# Patient Record
Sex: Male | Born: 1950 | Race: White | Hispanic: No | Marital: Married | Smoking: Former smoker
Health system: Southern US, Community
[De-identification: ages and names within clinical notes are randomized; demographics above are authoritative.]

## PROBLEM LIST (undated history)

## (undated) DIAGNOSIS — G4730 Sleep apnea, unspecified: Secondary | ICD-10-CM

## (undated) DIAGNOSIS — M1990 Unspecified osteoarthritis, unspecified site: Secondary | ICD-10-CM

## (undated) DIAGNOSIS — E291 Testicular hypofunction: Secondary | ICD-10-CM

## (undated) DIAGNOSIS — E785 Hyperlipidemia, unspecified: Secondary | ICD-10-CM

## (undated) DIAGNOSIS — E108 Type 1 diabetes mellitus with unspecified complications: Secondary | ICD-10-CM

## (undated) DIAGNOSIS — K91840 Postprocedural hemorrhage and hematoma of a digestive system organ or structure following a digestive system procedure: Secondary | ICD-10-CM

## (undated) DIAGNOSIS — Z5189 Encounter for other specified aftercare: Secondary | ICD-10-CM

## (undated) DIAGNOSIS — I209 Angina pectoris, unspecified: Secondary | ICD-10-CM

## (undated) DIAGNOSIS — T7840XA Allergy, unspecified, initial encounter: Secondary | ICD-10-CM

## (undated) DIAGNOSIS — I2510 Atherosclerotic heart disease of native coronary artery without angina pectoris: Principal | ICD-10-CM

## (undated) DIAGNOSIS — G4733 Obstructive sleep apnea (adult) (pediatric): Secondary | ICD-10-CM

## (undated) DIAGNOSIS — K635 Polyp of colon: Secondary | ICD-10-CM

## (undated) DIAGNOSIS — I10 Essential (primary) hypertension: Secondary | ICD-10-CM

## (undated) DIAGNOSIS — M109 Gout, unspecified: Secondary | ICD-10-CM

## (undated) DIAGNOSIS — N259 Disorder resulting from impaired renal tubular function, unspecified: Secondary | ICD-10-CM

## (undated) DIAGNOSIS — E11319 Type 2 diabetes mellitus with unspecified diabetic retinopathy without macular edema: Secondary | ICD-10-CM

## (undated) DIAGNOSIS — R0602 Shortness of breath: Secondary | ICD-10-CM

## (undated) HISTORY — DX: Unspecified osteoarthritis, unspecified site: M19.90

## (undated) HISTORY — PX: CARPAL TUNNEL RELEASE: SHX101

## (undated) HISTORY — DX: Encounter for other specified aftercare: Z51.89

## (undated) HISTORY — DX: Atherosclerotic heart disease of native coronary artery without angina pectoris: I25.10

## (undated) HISTORY — DX: Type 1 diabetes mellitus with unspecified complications: E10.8

## (undated) HISTORY — DX: Hyperlipidemia, unspecified: E78.5

## (undated) HISTORY — DX: Sleep apnea, unspecified: G47.30

## (undated) HISTORY — PX: EYE EXAMINATION UNDER ANESTHESIA W/ RETINAL CRYOTHERAPY AND RETINAL LASER: SHX1561

## (undated) HISTORY — PX: COLONOSCOPY: SHX174

## (undated) HISTORY — DX: Allergy, unspecified, initial encounter: T78.40XA

## (undated) HISTORY — DX: Gout, unspecified: M10.9

## (undated) HISTORY — DX: Postprocedural hemorrhage of a digestive system organ or structure following a digestive system procedure: K91.840

## (undated) HISTORY — DX: Polyp of colon: K63.5

## (undated) HISTORY — DX: Essential (primary) hypertension: I10

## (undated) HISTORY — PX: POLYPECTOMY: SHX149

## (undated) HISTORY — DX: Testicular hypofunction: E29.1

## (undated) HISTORY — DX: Type 2 diabetes mellitus with unspecified diabetic retinopathy without macular edema: E11.319

## (undated) HISTORY — DX: Disorder resulting from impaired renal tubular function, unspecified: N25.9

---

## 2001-11-28 ENCOUNTER — Encounter: Payer: Self-pay | Admitting: Family Medicine

## 2001-11-28 LAB — CONVERTED CEMR LAB

## 2002-01-03 ENCOUNTER — Encounter: Admission: RE | Admit: 2002-01-03 | Discharge: 2002-01-03 | Payer: Self-pay | Admitting: Family Medicine

## 2002-01-03 ENCOUNTER — Encounter: Payer: Self-pay | Admitting: Family Medicine

## 2002-02-18 ENCOUNTER — Encounter: Admission: RE | Admit: 2002-02-18 | Discharge: 2002-05-19 | Payer: Self-pay | Admitting: Family Medicine

## 2005-01-24 ENCOUNTER — Ambulatory Visit: Payer: Self-pay | Admitting: Family Medicine

## 2005-01-27 ENCOUNTER — Ambulatory Visit: Payer: Self-pay | Admitting: Family Medicine

## 2005-02-07 ENCOUNTER — Ambulatory Visit: Payer: Self-pay | Admitting: Family Medicine

## 2005-02-28 ENCOUNTER — Encounter: Admission: RE | Admit: 2005-02-28 | Discharge: 2005-02-28 | Payer: Self-pay | Admitting: Nephrology

## 2005-03-31 ENCOUNTER — Encounter: Admission: RE | Admit: 2005-03-31 | Discharge: 2005-06-29 | Payer: Self-pay | Admitting: Nephrology

## 2005-04-08 ENCOUNTER — Ambulatory Visit: Payer: Self-pay | Admitting: Family Medicine

## 2006-03-29 ENCOUNTER — Ambulatory Visit: Payer: Self-pay | Admitting: Family Medicine

## 2006-04-11 ENCOUNTER — Ambulatory Visit: Payer: Self-pay | Admitting: Family Medicine

## 2006-04-19 ENCOUNTER — Ambulatory Visit: Payer: Self-pay | Admitting: Family Medicine

## 2006-07-12 ENCOUNTER — Ambulatory Visit: Payer: Self-pay | Admitting: Family Medicine

## 2006-07-17 ENCOUNTER — Ambulatory Visit: Payer: Self-pay | Admitting: Family Medicine

## 2006-08-21 ENCOUNTER — Encounter: Payer: Self-pay | Admitting: Family Medicine

## 2006-08-23 ENCOUNTER — Ambulatory Visit: Payer: Self-pay | Admitting: Family Medicine

## 2006-11-06 ENCOUNTER — Ambulatory Visit: Payer: Self-pay | Admitting: Family Medicine

## 2006-11-06 LAB — CONVERTED CEMR LAB
Creatinine,U: 200.1 mg/dL
Hgb A1c MFr Bld: 8.2 % — ABNORMAL HIGH (ref 4.6–6.0)
Microalb Creat Ratio: 283.4 mg/g — ABNORMAL HIGH (ref 0.0–30.0)
Microalb, Ur: 56.7 mg/dL (ref 0.0–1.9)

## 2006-12-29 ENCOUNTER — Ambulatory Visit: Payer: Self-pay | Admitting: Family Medicine

## 2006-12-29 LAB — CONVERTED CEMR LAB: Uric Acid, Serum: 9.3 mg/dL — ABNORMAL HIGH (ref 2.4–7.0)

## 2007-02-12 ENCOUNTER — Ambulatory Visit: Payer: Self-pay | Admitting: Family Medicine

## 2007-02-12 LAB — CONVERTED CEMR LAB
ALT: 45 units/L — ABNORMAL HIGH (ref 0–40)
AST: 33 units/L (ref 0–37)
Albumin: 3.9 g/dL (ref 3.5–5.2)
Alkaline Phosphatase: 64 units/L (ref 39–117)
BUN: 15 mg/dL (ref 6–23)
Basophils Absolute: 0 10*3/uL (ref 0.0–0.1)
Basophils Relative: 0.4 % (ref 0.0–1.0)
Bilirubin, Direct: 0.2 mg/dL (ref 0.0–0.3)
CO2: 31 meq/L (ref 19–32)
Calcium: 9.3 mg/dL (ref 8.4–10.5)
Chloride: 100 meq/L (ref 96–112)
Creatinine, Ser: 1.3 mg/dL (ref 0.4–1.5)
Creatinine,U: 247 mg/dL
Eosinophils Absolute: 0.1 10*3/uL (ref 0.0–0.6)
Eosinophils Relative: 1.1 % (ref 0.0–5.0)
GFR calc Af Amer: 73 mL/min
GFR calc non Af Amer: 60 mL/min
Glucose, Bld: 143 mg/dL — ABNORMAL HIGH (ref 70–99)
HCT: 38.2 % — ABNORMAL LOW (ref 39.0–52.0)
Hemoglobin: 13.2 g/dL (ref 13.0–17.0)
Hgb A1c MFr Bld: 8 % — ABNORMAL HIGH (ref 4.6–6.0)
Lymphocytes Relative: 20.3 % (ref 12.0–46.0)
MCHC: 34.5 g/dL (ref 30.0–36.0)
MCV: 90 fL (ref 78.0–100.0)
Microalb Creat Ratio: 738.1 mg/g — ABNORMAL HIGH (ref 0.0–30.0)
Microalb, Ur: 182.3 mg/dL (ref 0.0–1.9)
Monocytes Absolute: 0.5 10*3/uL (ref 0.2–0.7)
Monocytes Relative: 8.7 % (ref 3.0–11.0)
Neutro Abs: 4.2 10*3/uL (ref 1.4–7.7)
Neutrophils Relative %: 69.5 % (ref 43.0–77.0)
Platelets: 184 10*3/uL (ref 150–400)
Potassium: 3.7 meq/L (ref 3.5–5.1)
RBC: 4.24 M/uL (ref 4.22–5.81)
RDW: 13.5 % (ref 11.5–14.6)
Sodium: 140 meq/L (ref 135–145)
Total Bilirubin: 0.7 mg/dL (ref 0.3–1.2)
Total Protein: 6.8 g/dL (ref 6.0–8.3)
Uric Acid, Serum: 6 mg/dL (ref 2.4–7.0)
WBC: 6 10*3/uL (ref 4.5–10.5)

## 2007-02-26 ENCOUNTER — Ambulatory Visit: Payer: Self-pay | Admitting: Endocrinology

## 2007-03-20 ENCOUNTER — Ambulatory Visit: Payer: Self-pay | Admitting: Endocrinology

## 2007-04-19 ENCOUNTER — Ambulatory Visit: Payer: Self-pay | Admitting: Endocrinology

## 2007-06-11 ENCOUNTER — Ambulatory Visit: Payer: Self-pay | Admitting: Endocrinology

## 2007-06-11 LAB — CONVERTED CEMR LAB: Hgb A1c MFr Bld: 8.9 % — ABNORMAL HIGH (ref 4.6–6.0)

## 2007-06-19 ENCOUNTER — Ambulatory Visit: Payer: Self-pay | Admitting: Endocrinology

## 2007-07-06 ENCOUNTER — Encounter: Payer: Self-pay | Admitting: Family Medicine

## 2007-07-06 DIAGNOSIS — E1169 Type 2 diabetes mellitus with other specified complication: Secondary | ICD-10-CM | POA: Insufficient documentation

## 2007-07-06 DIAGNOSIS — I10 Essential (primary) hypertension: Secondary | ICD-10-CM

## 2007-07-06 DIAGNOSIS — E785 Hyperlipidemia, unspecified: Secondary | ICD-10-CM

## 2007-07-06 DIAGNOSIS — E1159 Type 2 diabetes mellitus with other circulatory complications: Secondary | ICD-10-CM

## 2007-07-06 HISTORY — DX: Essential (primary) hypertension: I10

## 2007-07-11 ENCOUNTER — Encounter: Payer: Self-pay | Admitting: Family Medicine

## 2007-07-11 DIAGNOSIS — M109 Gout, unspecified: Secondary | ICD-10-CM

## 2007-07-11 HISTORY — DX: Gout, unspecified: M10.9

## 2007-07-12 ENCOUNTER — Ambulatory Visit: Payer: Self-pay | Admitting: Endocrinology

## 2007-07-17 ENCOUNTER — Encounter: Payer: Self-pay | Admitting: Family Medicine

## 2007-07-31 ENCOUNTER — Ambulatory Visit: Payer: Self-pay | Admitting: Family Medicine

## 2007-07-31 LAB — CONVERTED CEMR LAB
Basophils Relative: 0 % (ref 0.0–1.0)
Bilirubin Urine: NEGATIVE
Bilirubin, Direct: 0.1 mg/dL (ref 0.0–0.3)
Blood in Urine, dipstick: NEGATIVE
CO2: 32 meq/L (ref 19–32)
Cholesterol: 115 mg/dL (ref 0–200)
Creatinine, Ser: 1.9 mg/dL — ABNORMAL HIGH (ref 0.4–1.5)
Eosinophils Relative: 0.7 % (ref 0.0–5.0)
GFR calc Af Amer: 47 mL/min
Glucose, Bld: 160 mg/dL — ABNORMAL HIGH (ref 70–99)
Glucose, Urine, Semiquant: NEGATIVE
HCT: 37.8 % — ABNORMAL LOW (ref 39.0–52.0)
Hemoglobin: 13.2 g/dL (ref 13.0–17.0)
Ketones, urine, test strip: NEGATIVE
Lymphocytes Relative: 14.2 % (ref 12.0–46.0)
Microalb Creat Ratio: 115.1 mg/g — ABNORMAL HIGH (ref 0.0–30.0)
Microalb, Ur: 22 mg/dL — ABNORMAL HIGH (ref 0.0–1.9)
Monocytes Absolute: 0.5 10*3/uL (ref 0.2–0.7)
Monocytes Relative: 5.5 % (ref 3.0–11.0)
Neutro Abs: 6.7 10*3/uL (ref 1.4–7.7)
Neutrophils Relative %: 79.6 % — ABNORMAL HIGH (ref 43.0–77.0)
Nitrite: NEGATIVE
Potassium: 4.2 meq/L (ref 3.5–5.1)
Sodium: 146 meq/L — ABNORMAL HIGH (ref 135–145)
Specific Gravity, Urine: 1.02
Total Bilirubin: 0.7 mg/dL (ref 0.3–1.2)
Total Protein: 7.4 g/dL (ref 6.0–8.3)
Urobilinogen, UA: 0.2
VLDL: 61 mg/dL — ABNORMAL HIGH (ref 0–40)
WBC Urine, dipstick: NEGATIVE
WBC: 8.5 10*3/uL (ref 4.5–10.5)
pH: 5

## 2007-08-09 ENCOUNTER — Ambulatory Visit: Payer: Self-pay | Admitting: Family Medicine

## 2007-08-09 DIAGNOSIS — E108 Type 1 diabetes mellitus with unspecified complications: Secondary | ICD-10-CM

## 2007-08-09 DIAGNOSIS — N529 Male erectile dysfunction, unspecified: Secondary | ICD-10-CM | POA: Insufficient documentation

## 2007-08-09 HISTORY — DX: Type 1 diabetes mellitus with unspecified complications: E10.8

## 2007-09-06 ENCOUNTER — Encounter: Payer: Self-pay | Admitting: Endocrinology

## 2007-09-06 ENCOUNTER — Ambulatory Visit: Payer: Self-pay | Admitting: Endocrinology

## 2007-09-06 LAB — CONVERTED CEMR LAB: Hgb A1c MFr Bld: 6.8 % — ABNORMAL HIGH (ref 4.6–6.0)

## 2007-10-22 ENCOUNTER — Telehealth: Payer: Self-pay | Admitting: Family Medicine

## 2007-12-17 ENCOUNTER — Telehealth: Payer: Self-pay | Admitting: Endocrinology

## 2008-02-19 ENCOUNTER — Telehealth (INDEPENDENT_AMBULATORY_CARE_PROVIDER_SITE_OTHER): Payer: Self-pay | Admitting: *Deleted

## 2008-03-04 ENCOUNTER — Ambulatory Visit: Payer: Self-pay | Admitting: Endocrinology

## 2008-03-04 LAB — CONVERTED CEMR LAB
Creatinine,U: 202.8 mg/dL
Hgb A1c MFr Bld: 7 % — ABNORMAL HIGH (ref 4.6–6.0)
Microalb, Ur: 66.5 mg/dL — ABNORMAL HIGH (ref 0.0–1.9)

## 2008-07-02 ENCOUNTER — Ambulatory Visit: Payer: Self-pay | Admitting: Family Medicine

## 2008-07-03 ENCOUNTER — Telehealth (INDEPENDENT_AMBULATORY_CARE_PROVIDER_SITE_OTHER): Payer: Self-pay | Admitting: *Deleted

## 2008-07-03 ENCOUNTER — Ambulatory Visit: Payer: Self-pay | Admitting: Endocrinology

## 2008-07-03 LAB — CONVERTED CEMR LAB
Eosinophils Absolute: 0.1 10*3/uL (ref 0.0–0.7)
Eosinophils Relative: 1.2 % (ref 0.0–5.0)
HCT: 41.6 % (ref 39.0–52.0)
Hgb A1c MFr Bld: 6.2 % — ABNORMAL HIGH (ref 4.6–6.0)
MCV: 96.3 fL (ref 78.0–100.0)
Mono Screen: NEGATIVE
Monocytes Absolute: 0.6 10*3/uL (ref 0.1–1.0)
Monocytes Relative: 6.9 % (ref 3.0–12.0)
Neutrophils Relative %: 73.5 % (ref 43.0–77.0)
Platelets: 159 10*3/uL (ref 150–400)
RDW: 13.8 % (ref 11.5–14.6)
WBC: 8 10*3/uL (ref 4.5–10.5)

## 2008-07-15 ENCOUNTER — Encounter: Payer: Self-pay | Admitting: Endocrinology

## 2008-08-11 ENCOUNTER — Telehealth: Payer: Self-pay | Admitting: Family Medicine

## 2008-08-26 ENCOUNTER — Telehealth: Payer: Self-pay | Admitting: Family Medicine

## 2008-08-26 ENCOUNTER — Ambulatory Visit: Payer: Self-pay | Admitting: Family Medicine

## 2008-08-26 LAB — CONVERTED CEMR LAB
AST: 31 units/L (ref 0–37)
Albumin: 4.3 g/dL (ref 3.5–5.2)
BUN: 18 mg/dL (ref 6–23)
Basophils Absolute: 0.1 10*3/uL (ref 0.0–0.1)
Basophils Relative: 1 % (ref 0.0–3.0)
Blood in Urine, dipstick: NEGATIVE
Calcium: 9.4 mg/dL (ref 8.4–10.5)
Chloride: 102 meq/L (ref 96–112)
Creatinine, Ser: 1.4 mg/dL (ref 0.4–1.5)
Creatinine,U: 158.6 mg/dL
Eosinophils Absolute: 0.1 10*3/uL (ref 0.0–0.7)
Eosinophils Relative: 1.4 % (ref 0.0–5.0)
GFR calc non Af Amer: 55 mL/min
Glucose, Urine, Semiquant: NEGATIVE
HCT: 40.9 % (ref 39.0–52.0)
Hemoglobin: 14.5 g/dL (ref 13.0–17.0)
Hgb A1c MFr Bld: 6.5 % — ABNORMAL HIGH (ref 4.6–6.0)
MCHC: 35.4 g/dL (ref 30.0–36.0)
MCV: 95.9 fL (ref 78.0–100.0)
Microalb, Ur: 25.4 mg/dL — ABNORMAL HIGH (ref 0.0–1.9)
Monocytes Absolute: 0.4 10*3/uL (ref 0.1–1.0)
Neutro Abs: 4.1 10*3/uL (ref 1.4–7.7)
Neutrophils Relative %: 70.3 % (ref 43.0–77.0)
Nitrite: NEGATIVE
RBC: 4.26 M/uL (ref 4.22–5.81)
Specific Gravity, Urine: 1.02
Total Bilirubin: 0.9 mg/dL (ref 0.3–1.2)
WBC Urine, dipstick: NEGATIVE
WBC: 5.9 10*3/uL (ref 4.5–10.5)
pH: 6.5

## 2008-09-01 ENCOUNTER — Encounter: Payer: Self-pay | Admitting: Family Medicine

## 2008-09-02 ENCOUNTER — Ambulatory Visit: Payer: Self-pay | Admitting: Family Medicine

## 2008-09-16 ENCOUNTER — Ambulatory Visit: Payer: Self-pay | Admitting: Internal Medicine

## 2008-10-02 ENCOUNTER — Ambulatory Visit: Payer: Self-pay | Admitting: Gastroenterology

## 2008-10-02 DIAGNOSIS — Z860100 Personal history of colon polyps, unspecified: Secondary | ICD-10-CM | POA: Insufficient documentation

## 2008-10-07 ENCOUNTER — Telehealth: Payer: Self-pay | Admitting: Gastroenterology

## 2008-10-08 ENCOUNTER — Ambulatory Visit: Payer: Self-pay | Admitting: Gastroenterology

## 2008-10-08 ENCOUNTER — Encounter: Payer: Self-pay | Admitting: Gastroenterology

## 2008-10-09 ENCOUNTER — Encounter: Payer: Self-pay | Admitting: Gastroenterology

## 2008-10-31 ENCOUNTER — Telehealth (INDEPENDENT_AMBULATORY_CARE_PROVIDER_SITE_OTHER): Payer: Self-pay | Admitting: *Deleted

## 2008-11-10 ENCOUNTER — Ambulatory Visit: Payer: Self-pay | Admitting: Endocrinology

## 2008-11-10 LAB — CONVERTED CEMR LAB: Hgb A1c MFr Bld: 6.8 % — ABNORMAL HIGH (ref 4.6–6.0)

## 2008-12-04 ENCOUNTER — Ambulatory Visit: Payer: Self-pay | Admitting: Family Medicine

## 2008-12-04 LAB — CONVERTED CEMR LAB
CO2: 31 meq/L (ref 19–32)
Chloride: 100 meq/L (ref 96–112)
Creatinine, Ser: 1.8 mg/dL — ABNORMAL HIGH (ref 0.4–1.5)
Glucose, Bld: 98 mg/dL (ref 70–99)
Sodium: 140 meq/L (ref 135–145)

## 2008-12-10 ENCOUNTER — Ambulatory Visit: Payer: Self-pay | Admitting: Family Medicine

## 2008-12-11 DIAGNOSIS — N183 Chronic kidney disease, stage 3 (moderate): Secondary | ICD-10-CM

## 2008-12-11 DIAGNOSIS — N259 Disorder resulting from impaired renal tubular function, unspecified: Secondary | ICD-10-CM

## 2008-12-11 DIAGNOSIS — E1122 Type 2 diabetes mellitus with diabetic chronic kidney disease: Secondary | ICD-10-CM | POA: Insufficient documentation

## 2008-12-11 HISTORY — DX: Disorder resulting from impaired renal tubular function, unspecified: N25.9

## 2009-02-16 ENCOUNTER — Ambulatory Visit: Payer: Self-pay | Admitting: Endocrinology

## 2009-02-16 LAB — CONVERTED CEMR LAB: Hgb A1c MFr Bld: 6.8 % — ABNORMAL HIGH (ref 4.6–6.5)

## 2009-04-17 ENCOUNTER — Ambulatory Visit: Payer: Self-pay | Admitting: Family Medicine

## 2009-06-11 ENCOUNTER — Ambulatory Visit: Payer: Self-pay | Admitting: Endocrinology

## 2009-06-11 DIAGNOSIS — R809 Proteinuria, unspecified: Secondary | ICD-10-CM | POA: Insufficient documentation

## 2009-06-11 LAB — CONVERTED CEMR LAB
Creatinine,U: 93.1 mg/dL
Hgb A1c MFr Bld: 6.4 % (ref 4.6–6.5)
Microalb Creat Ratio: 151.5 mg/g — ABNORMAL HIGH (ref 0.0–30.0)

## 2009-07-06 ENCOUNTER — Telehealth: Payer: Self-pay | Admitting: Endocrinology

## 2009-07-10 ENCOUNTER — Encounter: Payer: Self-pay | Admitting: Endocrinology

## 2009-09-04 ENCOUNTER — Ambulatory Visit: Payer: Self-pay | Admitting: Family Medicine

## 2009-09-04 LAB — CONVERTED CEMR LAB
ALT: 44 units/L (ref 0–53)
BUN: 21 mg/dL (ref 6–23)
Basophils Absolute: 0 10*3/uL (ref 0.0–0.1)
Chloride: 105 meq/L (ref 96–112)
Cholesterol: 122 mg/dL (ref 0–200)
Eosinophils Absolute: 0.1 10*3/uL (ref 0.0–0.7)
Glucose, Bld: 148 mg/dL — ABNORMAL HIGH (ref 70–99)
HCT: 41.5 % (ref 39.0–52.0)
Ketones, ur: NEGATIVE mg/dL
Leukocytes, UA: NEGATIVE
Lymphs Abs: 1.3 10*3/uL (ref 0.7–4.0)
MCHC: 34.1 g/dL (ref 30.0–36.0)
MCV: 98.5 fL (ref 78.0–100.0)
Monocytes Absolute: 0.5 10*3/uL (ref 0.1–1.0)
PSA: 1.26 ng/mL (ref 0.10–4.00)
Platelets: 127 10*3/uL — ABNORMAL LOW (ref 150.0–400.0)
Potassium: 4.7 meq/L (ref 3.5–5.1)
RDW: 13.2 % (ref 11.5–14.6)
Specific Gravity, Urine: 1.015 (ref 1.000–1.030)
TSH: 2.27 microintl units/mL (ref 0.35–5.50)
Total Bilirubin: 0.9 mg/dL (ref 0.3–1.2)
Urobilinogen, UA: 0.2 (ref 0.0–1.0)

## 2009-09-14 ENCOUNTER — Ambulatory Visit: Payer: Self-pay | Admitting: Family Medicine

## 2009-10-08 ENCOUNTER — Ambulatory Visit: Payer: Self-pay | Admitting: Endocrinology

## 2009-10-08 DIAGNOSIS — G4733 Obstructive sleep apnea (adult) (pediatric): Secondary | ICD-10-CM

## 2009-10-08 HISTORY — DX: Obstructive sleep apnea (adult) (pediatric): G47.33

## 2009-10-08 LAB — CONVERTED CEMR LAB: Hgb A1c MFr Bld: 6.5 % (ref 4.6–6.5)

## 2009-10-16 ENCOUNTER — Encounter: Payer: Self-pay | Admitting: Family Medicine

## 2009-11-05 ENCOUNTER — Ambulatory Visit: Payer: Self-pay | Admitting: Pulmonary Disease

## 2009-11-19 ENCOUNTER — Encounter: Payer: Self-pay | Admitting: Pulmonary Disease

## 2009-11-19 ENCOUNTER — Ambulatory Visit (HOSPITAL_BASED_OUTPATIENT_CLINIC_OR_DEPARTMENT_OTHER): Admission: RE | Admit: 2009-11-19 | Discharge: 2009-11-19 | Payer: Self-pay | Admitting: Pulmonary Disease

## 2009-11-25 ENCOUNTER — Ambulatory Visit: Payer: Self-pay | Admitting: Pulmonary Disease

## 2009-11-26 ENCOUNTER — Encounter: Payer: Self-pay | Admitting: Pulmonary Disease

## 2009-11-30 ENCOUNTER — Telehealth (INDEPENDENT_AMBULATORY_CARE_PROVIDER_SITE_OTHER): Payer: Self-pay | Admitting: *Deleted

## 2009-12-07 ENCOUNTER — Ambulatory Visit: Payer: Self-pay | Admitting: Pulmonary Disease

## 2009-12-15 ENCOUNTER — Encounter: Payer: Self-pay | Admitting: Pulmonary Disease

## 2010-01-06 ENCOUNTER — Ambulatory Visit: Payer: Self-pay | Admitting: Pulmonary Disease

## 2010-02-04 ENCOUNTER — Encounter: Payer: Self-pay | Admitting: Pulmonary Disease

## 2010-02-05 ENCOUNTER — Ambulatory Visit: Payer: Self-pay | Admitting: Endocrinology

## 2010-02-05 LAB — CONVERTED CEMR LAB: Hgb A1c MFr Bld: 6.4 % (ref 4.6–6.5)

## 2010-02-26 ENCOUNTER — Ambulatory Visit: Payer: Self-pay | Admitting: Pulmonary Disease

## 2010-02-26 DIAGNOSIS — F17200 Nicotine dependence, unspecified, uncomplicated: Secondary | ICD-10-CM | POA: Insufficient documentation

## 2010-03-23 ENCOUNTER — Telehealth: Payer: Self-pay | Admitting: Pulmonary Disease

## 2010-03-25 ENCOUNTER — Telehealth (INDEPENDENT_AMBULATORY_CARE_PROVIDER_SITE_OTHER): Payer: Self-pay | Admitting: *Deleted

## 2010-04-05 ENCOUNTER — Telehealth (INDEPENDENT_AMBULATORY_CARE_PROVIDER_SITE_OTHER): Payer: Self-pay | Admitting: *Deleted

## 2010-06-11 ENCOUNTER — Ambulatory Visit: Payer: Self-pay | Admitting: Endocrinology

## 2010-06-11 DIAGNOSIS — E291 Testicular hypofunction: Secondary | ICD-10-CM

## 2010-06-11 HISTORY — DX: Testicular hypofunction: E29.1

## 2010-06-11 LAB — CONVERTED CEMR LAB: Hgb A1c MFr Bld: 6.4 % (ref 4.6–6.5)

## 2010-06-15 ENCOUNTER — Ambulatory Visit: Payer: Self-pay | Admitting: Endocrinology

## 2010-06-15 LAB — CONVERTED CEMR LAB
FSH: 13.4 milliintl units/mL (ref 1.4–18.1)
LH: 5.54 milliintl units/mL (ref 1.50–9.30)

## 2010-06-17 ENCOUNTER — Telehealth: Payer: Self-pay | Admitting: Endocrinology

## 2010-08-16 ENCOUNTER — Encounter: Payer: Self-pay | Admitting: Endocrinology

## 2010-08-20 LAB — HM DIABETES EYE EXAM

## 2010-08-25 ENCOUNTER — Ambulatory Visit: Payer: Self-pay | Admitting: Pulmonary Disease

## 2010-09-10 ENCOUNTER — Ambulatory Visit: Payer: Self-pay | Admitting: Family Medicine

## 2010-09-10 LAB — CONVERTED CEMR LAB
ALT: 32 units/L (ref 0–53)
AST: 30 units/L (ref 0–37)
Albumin: 4.3 g/dL (ref 3.5–5.2)
Alkaline Phosphatase: 70 units/L (ref 39–117)
BUN: 23 mg/dL (ref 6–23)
Basophils Absolute: 0 10*3/uL (ref 0.0–0.1)
Basophils Relative: 0.3 % (ref 0.0–3.0)
Bilirubin Urine: NEGATIVE
Bilirubin, Direct: 0.1 mg/dL (ref 0.0–0.3)
Blood in Urine, dipstick: NEGATIVE
CO2: 29 meq/L (ref 19–32)
Calcium: 9.2 mg/dL (ref 8.4–10.5)
Chloride: 102 meq/L (ref 96–112)
Cholesterol: 92 mg/dL (ref 0–200)
Creatinine, Ser: 1.5 mg/dL (ref 0.4–1.5)
Creatinine,U: 50.8 mg/dL
Eosinophils Absolute: 0.1 10*3/uL (ref 0.0–0.7)
Eosinophils Relative: 0.8 % (ref 0.0–5.0)
GFR calc non Af Amer: 51.83 mL/min (ref >60)
Glucose, Bld: 108 mg/dL — ABNORMAL HIGH (ref 70–99)
Glucose, Urine, Semiquant: NEGATIVE
HCT: 41.7 % (ref 39.0–52.0)
HDL: 23.8 mg/dL — ABNORMAL LOW (ref >39.00)
Hemoglobin: 14.3 g/dL (ref 13.0–17.0)
Hgb A1c MFr Bld: 6.4 % (ref 4.6–6.5)
Ketones, urine, test strip: NEGATIVE
LDL Cholesterol: 34 mg/dL (ref 0–99)
Lymphocytes Relative: 18.8 % (ref 12.0–46.0)
Lymphs Abs: 1.5 10*3/uL (ref 0.7–4.0)
MCHC: 34.3 g/dL (ref 30.0–36.0)
MCV: 98.9 fL (ref 78.0–100.0)
Microalb Creat Ratio: 15.4 mg/g (ref 0.0–30.0)
Microalb, Ur: 7.8 mg/dL — ABNORMAL HIGH (ref 0.0–1.9)
Monocytes Absolute: 0.6 10*3/uL (ref 0.1–1.0)
Monocytes Relative: 8 % (ref 3.0–12.0)
Neutro Abs: 5.7 10*3/uL (ref 1.4–7.7)
Neutrophils Relative %: 72.1 % (ref 43.0–77.0)
Nitrite: NEGATIVE
PSA: 1.33 ng/mL (ref 0.10–4.00)
Platelets: 137 10*3/uL — ABNORMAL LOW (ref 150.0–400.0)
Potassium: 4.4 meq/L (ref 3.5–5.1)
Protein, U semiquant: NEGATIVE
RBC: 4.22 M/uL (ref 4.22–5.81)
RDW: 14.2 % (ref 11.5–14.6)
Sodium: 141 meq/L (ref 135–145)
Specific Gravity, Urine: 1.01
TSH: 1.94 microintl units/mL (ref 0.35–5.50)
Total Bilirubin: 0.7 mg/dL (ref 0.3–1.2)
Total CHOL/HDL Ratio: 4
Total Protein: 7 g/dL (ref 6.0–8.3)
Triglycerides: 171 mg/dL — ABNORMAL HIGH (ref 0.0–149.0)
Urobilinogen, UA: 0.2
VLDL: 34.2 mg/dL (ref 0.0–40.0)
WBC Urine, dipstick: NEGATIVE
WBC: 7.9 10*3/uL (ref 4.5–10.5)
pH: 5

## 2010-09-17 ENCOUNTER — Encounter: Payer: Self-pay | Admitting: Family Medicine

## 2010-09-17 ENCOUNTER — Ambulatory Visit: Payer: Self-pay | Admitting: Family Medicine

## 2010-10-11 ENCOUNTER — Telehealth: Payer: Self-pay | Admitting: Family Medicine

## 2010-10-15 ENCOUNTER — Ambulatory Visit: Payer: Self-pay | Admitting: Endocrinology

## 2010-10-18 ENCOUNTER — Telehealth: Payer: Self-pay | Admitting: Endocrinology

## 2010-10-19 ENCOUNTER — Ambulatory Visit: Payer: Self-pay | Admitting: Family Medicine

## 2010-12-28 NOTE — Assessment & Plan Note (Signed)
Summary: 1 month rov/njr   Vital Signs:  Patient profile:   60 year old male Weight:      260 pounds Temp:     98.6 degrees F oral BP sitting:   140 / 80  (left arm) Cuff size:   regular  Vitals Entered By: Kern Reap CMA Duncan Dull) (October 19, 2010 8:56 AM) CC: follow-up visit   Primary Care Provider:  Tinnie Gens Todd,MD  CC:  follow-up visit.  History of Present Illness: Bryan Haley is a 60 year old male.............Marland Kitchen ex smoker x 3 days...........on chantix, one tablet daily.  The comes in today for follow-up.  He states overall he feels well and smoke.  His last cigarette 3 days ago.  No major side effects from the chantix is currently taking one tablet daily.  His blood sugar is under much better control and on the hormonal supplement.  His testosterone level is 345.  Review of systems otherwise negative except that he is going to begin the losartan 100 mg at bedtime.  When he finishes his Micardis.  He also is under the care of Dr. Algis Downs.  the nephrologist  Allergies: No Known Drug Allergies  Past History:  Past medical, surgical, family and social histories (including risk factors) reviewed for relevance to current acute and chronic problems.  Past Medical History: Reviewed history from 12/10/2008 and no changes required. Hyperlipidemia Hypertension Gout DM Nephropathy Diabetes mellitus, type I erectile dysfunction, nonresponsive to oral medication diabetic retinopathy OU, laser surgery, Dr. Luciana Axe Colon polyps-type unknown 2002  Renal insufficiency  Past Surgical History: Reviewed history from 10/02/2008 and no changes required. Unremarkable  Family History: Reviewed history from 10/02/2008 and no changes required. Family History Diabetes 1st degree relative Family History High cholesterol Family History Hypertension No FH of Colon Cancer:  Social History: Reviewed history from 10/02/2008 and no changes required. Occupation:   Sales Married Alcohol  use-no Drug use-no Regular exercise-no Patient is a former smoker. Quit this morning 10/02/08 Went to seminar to quit  Review of Systems      See HPI  Physical Exam  General:  Well-developed,well-nourished,in no acute distress; alert,appropriate and cooperative throughout examination   Impression & Recommendations:  Problem # 1:  TOBACCO ABUSE (ICD-305.1) Assessment Improved  His updated medication list for this problem includes:    Chantix Continuing Month Pak 1 Mg Tabs (Varenicline tartrate) ..... Uad  Complete Medication List: 1)  Allopurinol 300 Mg Tabs (Allopurinol) .Marland Kitchen.. 1 once daily 2)  Furosemide 80 Mg Tabs (Furosemide) .... Take 1 tablet by mouth two times a day 3)  Humalog Kwikpen 100 Unit/ml Soln (Insulin lispro (human)) .... Three times a day (qac) 15-18-20 units 4)  Indomethacin Cr 75 Mg Cpcr (Indomethacin) .... Take 1 tablet by mouth every morning 5)  Simvastatin 80 Mg Tabs (Simvastatin) .... Take 1/2 tab by mouth  every morning 6)  Cardura 8 Mg Tabs (Doxazosin mesylate) .... Take 1 tablet by mouth once a day 7)  Aspirin 325 Mg Tbec (Aspirin) .... Once daily 8)  Zestril 40 Mg Tabs (Lisinopril) .... Take 1 tablet by mouth two times a day 9)  Coreg 3.125 Mg Tabs (Carvedilol) .... Take 1 tablet by mouth every morning 10)  Onetouch Ultra Test Strp (Glucose blood) .... Use 1 strip two times a day 250.01 11)  Novolin N 100 Unit/ml Susp (Insulin isophane human) .... 30 units at bedtime. 12)  Bd Insulin Syringe 29g X 1/2" 1 Ml Misc (Insulin syringe-needle u-100) .... Use as directed 13)  Viagra 100  Mg Tabs (Sildenafil citrate) .... Uad 14)  Ra Pen Needles 31g X 5 Mm Misc (Insulin pen needle) .... Use 1 3 times daily 15)  Amlodipine Besylate 10 Mg Tabs (Amlodipine besylate) .... Take one tab once daily 16)  Testim 1 % Gel (Testosterone) .... 5 grams once daily 17)  Losartan Potassium 100 Mg Tabs (Losartan potassium) .... Take 1 tablet by mouth every morning 18)  Chantix  Continuing Month Pak 1 Mg Tabs (Varenicline tartrate) .... Uad  Patient Instructions: 1)  congratulations on not smoking!!!!!!!!!!!!!!!!!!!!!. 2)  Continue the chantix......... one tablet daily for 4 months and then taper off the medication by taking a half a tablet a day for two weeks then a half a tablet Monday, Wednesday, Friday, for two weeks, then stop.........  However if you feel like we need to restart the medication.  There is no problem with restarting it and taking it for another couple months 3)  Please schedule a follow-up appointment in 1 year.   Orders Added: 1)  Est. Patient Level III [16109]

## 2010-12-28 NOTE — Assessment & Plan Note (Signed)
Summary: 4 mth fu---stc   Vital Signs:  Patient profile:   60 year old male Height:      69.25 inches (175.90 cm) Weight:      259.25 pounds (117.84 kg) BMI:     38.15 O2 Sat:      97 % on Room air Temp:     98.5 degrees F (36.94 degrees C) oral Pulse rate:   58 / minute BP sitting:   120 / 66  (left arm) Cuff size:   large  Vitals Entered By: Brenton Grills CMA Duncan Dull) (October 15, 2010 8:05 AM)  O2 Flow:  Room air CC: Follow-up visit/aj Is Patient Diabetic? Yes   Referring Provider:  Dr. Romero Belling Primary Provider:  Tinnie Gens Todd,MD  CC:  Follow-up visit/aj.  History of Present Illness: pt states he feels well in general.  he has lost weight, due to his efforts.  this caused him to need to reduce his humalog to 15-18-20, and the nph to 30 units at bedtime. he feels better, since on the testim  Current Medications (verified): 1)  Allopurinol 300 Mg Tabs (Allopurinol) .Marland Kitchen.. 1 Once Daily 2)  Furosemide 80 Mg Tabs (Furosemide) .... Take 1 Tablet By Mouth Two Times A Day 3)  Humalog Kwikpen 100 Unit/ml Soln (Insulin Lispro (Human)) .... Three Times A Day (Qac) 25-35-75 Units 4)  Indomethacin Cr 75 Mg Cpcr (Indomethacin) .... Take 1 Tablet By Mouth Every Morning 5)  Simvastatin 80 Mg  Tabs (Simvastatin) .... Take 1/2 Tab By Mouth  Every Morning 6)  Cardura 8 Mg  Tabs (Doxazosin Mesylate) .... Take 1 Tablet By Mouth Once A Day 7)  Aspirin 325 Mg  Tbec (Aspirin) .... Once Daily 8)  Zestril 40 Mg  Tabs (Lisinopril) .... Take 1 Tablet By Mouth Two Times A Day 9)  Coreg 3.125 Mg  Tabs (Carvedilol) .... Take 1 Tablet By Mouth Every Morning 10)  Onetouch Ultra Test   Strp (Glucose Blood) .... Use 1 Strip Three Times A Day 11)  Novolin N 100 Unit/ml  Susp (Insulin Isophane Human) .... 60 Units At Bedtime. 12)  Bd Insulin Syringe 29g X 1/2" 1 Ml  Misc (Insulin Syringe-Needle U-100) .... Use As Directed 13)  Viagra 100 Mg Tabs (Sildenafil Citrate) .... Uad 14)  Ra Pen Needles 31g X 5  Mm Misc (Insulin Pen Needle) .... Use 1 3 Times Daily 15)  Amlodipine Besylate 10 Mg Tabs (Amlodipine Besylate) .... Take One Tab Once Daily 16)  Testim 1 % Gel (Testosterone) .... 5 Grams Once Daily 17)  Losartan Potassium 100 Mg Tabs (Losartan Potassium) .... Take 1 Tablet By Mouth Every Morning 18)  Chantix Continuing Month Pak 1 Mg Tabs (Varenicline Tartrate) .... Uad  Allergies (verified): No Known Drug Allergies  Past History:  Past Medical History: Last updated: 12/10/2008 Hyperlipidemia Hypertension Gout DM Nephropathy Diabetes mellitus, type I erectile dysfunction, nonresponsive to oral medication diabetic retinopathy OU, laser surgery, Dr. Luciana Axe Colon polyps-type unknown 2002  Renal insufficiency  Review of Systems       no hypoglycemia since he reduced insulin  Physical Exam  General:  obese.  no distress  Pulses:  dorsalis pedis intact bilat.   Extremities:  no deformity.  no ulcer on the feet.  feet are of normal color and temp.  no edema  Neurologic:  sensation is intact to touch on the feet  Additional Exam:  a1c=6.4 Testosterone         [L]  345.72 ng/dL  Impression & Recommendations:  Problem # 1:  DIABETES MELLITUS, TYPE I (ICD-250.01) his insulin requirements have decreased due to weight-loss  Problem # 2:  HYPOGONADISM (ICD-257.2) Assessment: Improved  Medications Added to Medication List This Visit: 1)  Humalog Kwikpen 100 Unit/ml Soln (Insulin lispro (human)) .... Three times a day (qac) 15-18-20 units 2)  Onetouch Ultra Test Strp (Glucose blood) .... Use 1 strip two times a day 250.01 3)  Novolin N 100 Unit/ml Susp (Insulin isophane human) .... 30 units at bedtime.  Other Orders: Est. Patient Level III (63875) TLB-Testosterone, Total (84403-TESTO) Est. Patient Level III (64332)  Patient Instructions: 1)  blood tests are being ordered for you today.  please call [phone] to hear your test results. 2)  return 4 months 3)  check your  blood sugar 2 times a day.  vary the time of day when you check, between before the 3 meals, and at bedtime.  also check if you have symptoms of your blood sugar being too high or too low.  please keep a record of the readings and bring it to your next appointment here.  please call us sooner if you are having low blood sugar episodes. 4)  continue humalog (just before each meal), 15-18-20 units.   5)  continue nph units at bedtime. 6)  (update: i left message on phone-tree:  you could ncrease rx if you wanted to, but i favor continuig the same) Prescriptions: RA PEN NEEDLES 31G X 5 MM MISC (INSULIN PEN NEEDLE) use 1 3 times daily  #100 x 3   Entered and Authorized by:   Minus Breeding MD   Signed by:   Minus Breeding MD on 10/15/2010   Method used:   Electronically to        [facility] [phone]* (retail)       [address]       [place], [place]  [address]       Ph: [phone]       Fax: [phone]   RxID:   [id number] ONETOUCH ULTRA TEST   STRP (GLUCOSE BLOOD) use 1 strip two times a day 250.01  #100 x 6   Entered and Authorized by:   Minus Breeding MD   Signed by:   Minus Breeding MD on 10/15/2010   Method used:   Electronically to        [facility] [phone]* (retail)       [address]       [place], [place]  [address]       Ph: [phone]       Fax: [phone]   RxID:   [id number] BD INSULIN SYRINGE 29G X 1/2" 1 ML  MISC (INSULIN SYRINGE-NEEDLE U-100) use as directed  #100 x 6   Entered and Authorized by:   Minus Breeding MD   Signed by:   Minus Breeding MD on 10/15/2010   Method used:   Electronically to        [facility] [phone]* (retail)       [address]       [place], [place]  [address]       Ph: [phone]       Fax: [phone]   RxID:   [id number] NOVOLIN N 100 UNIT/ML  SUSP (INSULIN ISOPHANE HUMAN) 30 units at bedtime.  #1 vial x 11   Entered and Authorized by:   Minus Breeding MD  Signed by:   Minus Breeding MD on 10/15/2010    Method used:   Electronically to        [facility] [phone]* (retail)       [address]       [place], [place]  [address]       Ph: [phone]       Fax: [phone]   RxID:   [id number] HUMALOG KWIKPEN 100 UNIT/ML SOLN (INSULIN LISPRO (HUMAN)) three times a day (qac) 15-18-20 units  #2 boxes x 11   Entered and Authorized by:   Minus Breeding MD   Signed by:   Minus Breeding MD on 10/15/2010   Method used:   Electronically to        [facility] [phone]* (retail)       [address]       [place], [place]  [address]       Ph: [phone]       Fax: [phone]   RxID:   [id number]    Orders Added: 1)  Est. Patient Level III [16010] 2)  TLB-Testosterone, Total [84403-TESTO] 3)  Est. Patient Level III [93235]

## 2010-12-28 NOTE — Letter (Signed)
Summary: [facility]   Imported By: Maryln Gottron 08/23/2010 12:19:03  _____________________________________________________________________  External Attachment:    Type:   Image     Comment:   External Document

## 2010-12-28 NOTE — Assessment & Plan Note (Signed)
Summary: rov ///kp   Visit Type:  Follow-up Copy to:  Dr. Romero Belling Primary Provider/Referring Provider:  Tinnie Gens Todd,MD  CC:  Pt here for follow up. Pt states is using BiPAP every night approx 7 hours. Pt feels pressure is too much and wakes up when pressure changes.  History of Present Illness: 58/M ,smoker,  hypertensive on 4 meds & diabetic referred for evaluation of obstructive sleep apnea. Epworth Sleepiness Score is 15. He reports loud snoring, wife has moved out of the bedroom. He naps on his recliner after coming back from work & ofetn on weekends, naps are non refreshing. Sleep latency is 15 mins, 5-6 awakenings , wakes up tired, dry mouth, lets the dog out & naps until 7A.Drinks 3-4 cups of coffee daily & decaffeinated tea. He movesa round a lot during his sleep.  December 07, 2009 4:01 PM  severe obstructive sleep apnea, d/w pt, trial of auto cpap 10-20, lg FF mask, humidity BiPAP 24/16 required during study, could not tolerate higher pressures during study. He has been able to use CPAP x 6 nights, upto 7hrs/night, occasionally wakes up wiht higher pressure. Not feeling fully rested yet. Mouth dry, no nasal congestion.    Allergies: No Known Drug Allergies  Past History:  Past Medical History: Last updated: 12/10/2008 Hyperlipidemia Hypertension Gout DM Nephropathy Diabetes mellitus, type I erectile dysfunction, nonresponsive to oral medication diabetic retinopathy OU, laser surgery, Dr. 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 Quit this morning 10/02/08 Went to seminar to quit  Review of Systems  The patient denies anorexia, fever, weight loss, weight gain, vision loss, decreased hearing, hoarseness, chest pain, syncope, dyspnea on exertion, peripheral edema, prolonged cough, headaches,  hemoptysis, abdominal pain, melena, hematochezia, severe indigestion/heartburn, hematuria, muscle weakness, difficulty walking, depression, unusual weight change, and abnormal bleeding.    Vital Signs:  Patient profile:   60 year old male Height:      70 inches Weight:      295.13 pounds O2 Sat:      96 % on Room air Temp:     98.3 degrees F oral Pulse rate:   74 / minute BP sitting:   100 / 60  (left arm) Cuff size:   large  Vitals Entered By: Zackery Barefoot CMA (December 07, 2009 3:41 PM)  O2 Flow:  Room air CC: Pt here for follow up. Pt states is using BiPAP every night approx 7 hours. Pt feels pressure is too much and wakes up when pressure changes Comments Medications reviewed with patient Zackery Barefoot CMA  December 07, 2009 3:42 PM    Physical Exam  Additional Exam:  Gen. Pleasant, well-nourished, in no distress, normal affect ENT - no lesions, no post nasal drip, class 3 airway Neck: No JVD, no thyromegaly, no carotid bruits Lungs: no use of accessory muscles, no dullness to percussion, clear without rales or rhonchi  Cardiovascular: Rhythm regular, heart sounds  normal, no murmurs or gallops, no peripheral edema Musculoskeletal: No deformities, no cyanosis or clubbing      Impression & Recommendations:  Problem # 1:  SLEEP APNEA (ICD-780.57)  The pathophysiology of obstructive sleep apnea, it's cardiovascular consequences and modes of treatment including CPAP were discussed with the patient in great detail.  Compliance encouraged, wt loss emphasized, asked to avoid meds with sedative side effects, cautioned against driving when sleepy.  If  excessive daytime somnolence persists & obstructive sleep apnea is well treated on download, consider modafinil. On the other hand, if higher pressure required, change to BiPAP.  Orders: Est. Patient Level III (16109)  Patient Instructions: 1)  Please schedule a follow-up appointment in 1 month. 2)  Send download chip in    3)  Call for any problems    Appended Document: rov ///kp downlaod 1/3-1/18 >> avg pr 13.5, residual AHI 15, good compliance

## 2010-12-28 NOTE — Progress Notes (Signed)
Summary: INFO ONLY CONCERNING BILLING / CODING??????  Phone Note Call from Patient   Caller: Spouse   [phone] Summary of Call: Pt adv she spoke to [place] at the Billing Dept. at [facility] about a recent bill ($300 + ) that they received..... Pt was supposed to be billed for a cpx which is completely covered by [employer].Marland KitchenMarland KitchenMarland KitchenHowever, pts wife states that the wrong dx code was put in for appt.... She adv that [facility] Billing Myrene Buddy) told her that they would be "kicking" it back to [facility] so dx code can be corrected????????  Just wanted to make [facility] aware of same.  Initial call taken by: Debbra Riding,  October 11, 2010 9:35 AM  Follow-up for Phone Call        please have this under a reviewed the cutting and alternate as needed Follow-up by: Roderick Pee MD,  October 11, 2010 10:54 AM  Additional Follow-up for Phone Call Additional follow up Details #1::        Charge correction submitted. Additional Follow-up by: Trixie Dredge,  October 18, 2010 2:31 PM

## 2010-12-28 NOTE — Assessment & Plan Note (Signed)
Summary: 4 MTH FU  STC   Vital Signs:  Patient profile:   60 year old male Height:      70 inches (177.80 cm) Weight:      286.25 pounds (130.11 kg) O2 Sat:      95 % on Room air Temp:     97.7 degrees F (36.50 degrees C) oral Pulse rate:   68 / minute BP sitting:   122 / 58  (left arm) Cuff size:   large  Vitals Entered By: Sydell Axon (February 05, 2010 7:51 AM)  O2 Flow:  Room air CC: 4 month F/U   Referring Provider:  Dr. Romero Belling Primary Provider:  Tinnie Gens Todd,MD  CC:  4 month F/U.  History of Present Illness: pt states he feels well in general, especially with his successful sleep apnea treatment. no cbg record, but states cbg's are well-controlled, except for mild hypoglycemia in the afternoon.  Current Medications (verified): 1)  Allopurinol 300 Mg Tabs (Allopurinol) .Marland Kitchen.. 1 Once Daily 2)  Furosemide 80 Mg Tabs (Furosemide) .... Take 1 Tablet By Mouth Two Times A Day 3)  Humalog Kwikpen 100 Unit/ml Soln (Insulin Lispro (Human)) .... Three Times A Day (Qac) 30-50-80 4)  Indomethacin Cr 75 Mg Cpcr (Indomethacin) .Marland Kitchen.. 1 Capsule By Mouth Q Am 5)  Micardis 80 Mg Tabs (Telmisartan) .... 2 Tablet By Mouth Once A Day 6)  Simvastatin 80 Mg  Tabs (Simvastatin) .... Take 1/2 Tab By Mouth  Every Morning 7)  Cardura 8 Mg  Tabs (Doxazosin Mesylate) .... Take 1 Tablet By Mouth Once A Day 8)  Aspirin 325 Mg  Tbec (Aspirin) .... Once Daily 9)  Zestril 40 Mg  Tabs (Lisinopril) .... Take 1 Tablet By Mouth Two Times A Day 10)  Coreg 3.125 Mg  Tabs (Carvedilol) .... Take 1 Tablet By Mouth Every Morning 11)  Onetouch Ultra Test   Strp (Glucose Blood) .... Use 1 Strip Three Times A Day 12)  Novolin N 100 Unit/ml  Susp (Insulin Isophane Human) .Marland Kitchen.. 100 Units Qhs 13)  Bd Insulin Syringe 29g X 1/2" 1 Ml  Misc (Insulin Syringe-Needle U-100) .... Use As Directed 14)  Viagra 100 Mg Tabs (Sildenafil Citrate) .... Uad 15)  Ra Pen Needles 31g X 5 Mm Misc (Insulin Pen Needle) .... Use 1 3  Times Daily 16)  Amlodipine Besylate 10 Mg Tabs (Amlodipine Besylate) .... Take One Tab Once Daily  Allergies (verified): No Known Drug Allergies  Past History:  Past Medical History: Last updated: 12/10/2008 Hyperlipidemia Hypertension Gout DM Nephropathy Diabetes mellitus, type I erectile dysfunction, nonresponsive to oral medication diabetic retinopathy OU, laser surgery, Dr. Luciana Axe Colon polyps-type unknown 2002  Renal insufficiency  Review of Systems  The patient denies syncope.    Physical Exam  General:  morbidly obese.  no distress Neck:  Supple without thyroid enlargement or tenderness.  Additional Exam:  Hemoglobin A1C            6.4 %    Impression & Recommendations:  Problem # 1:  DIABETES MELLITUS, TYPE I (ICD-250.01) slightly overcontrolled  Medications Added to Medication List This Visit: 1)  Humalog Kwikpen 100 Unit/ml Soln (Insulin lispro (human)) .... Three times a day (qac) 30-40-80  Other Orders: TLB-A1C / Hgb A1C (Glycohemoglobin) (83036-A1C)  Patient Instructions: 1)  tests are being ordered for you today.  a few days after the test(s), please call [phone] to hear your test results. 2)  pending the test results, please continue the same medications  for now 3)  same nph (60 units at night). 4)  reduce humalog to (just before each meal) 30-40-80 units, and same nph.   5)  return 4 months 6)  check your blood sugar 2 times a day.  vary the time of day when you check, between before the 3 meals, and at bedtime.  also check if you have symptoms of your blood sugar being too high or too low.  please keep a record of the readings and bring it to your next appointment here.  please call us sooner if you are having low blood sugar episodes. 7)  (update: i left message on phone-tree:  rx as we discussed) Prescriptions: HUMALOG KWIKPEN 100 UNIT/ML SOLN (INSULIN LISPRO (HUMAN)) three times a day (qac) 30-40-80  #1 box x 11   Entered and Authorized by:    Minus Breeding MD   Signed by:   Minus Breeding MD on 02/05/2010   Method used:   Print then Give to Patient   RxID:   [id number]

## 2010-12-28 NOTE — Progress Notes (Signed)
Summary: rx refill req  Phone Note Refill Request Message from:  Fax from Pharmacy  Refills Requested: Medication #1:  TESTIM 1 % GEL 5 grams once daily   Dosage confirmed as above?Dosage Confirmed   Last Refilled: 09/18/2010   Notes: [facility] fax [phone]  Method Requested: Fax to Local Pharmacy Next Appointment Scheduled: 02/18/2011 Initial call taken by: Brenton Grills CMA Duncan Dull),  October 18, 2010 9:03 AM  Follow-up for Phone Call        i printed Follow-up by: Minus Breeding MD,  October 18, 2010 9:20 AM  Additional Follow-up for Phone Call Additional follow up Details #1::        rx faxed to [facility] pharmacy Additional Follow-up by: Brenton Grills CMA Duncan Dull),  October 18, 2010 9:44 AM    Prescriptions: TESTIM 1 % GEL (TESTOSTERONE) 5 grams once daily  #30 x 5   Entered and Authorized by:   Minus Breeding MD   Signed by:   Minus Breeding MD on 10/18/2010   Method used:   Print then Give to Patient   RxID:   [id number]

## 2010-12-28 NOTE — Progress Notes (Signed)
Summary: Testosterone  ---- Converted from flag ---- ---- 06/17/2010 9:20 AM, Verdell Face wrote:  Bryan Haley  Pt rec'd message on phone tree about [email or web address] to get a coupon,but he cannot find it??   Pt also states he wants the gel not the injection.  [phone] or [phone] Thanks [name] ------------------------------  Phone Note Call from Patient Call back at Work Phone [phone]   Caller: Patient [phone] Summary of Call: Please advise Initial call taken by: [name], CMA,  June 17, 2010 9:52 AM  Follow-up for Phone Call        i printed rx Follow-up by: Minus Breeding MD,  June 17, 2010 11:12 AM  Additional Follow-up for Phone Call Additional follow up Details #1::        pt informed and Rx faxed to pharmacy Additional Follow-up by: [name], CMA,  June 17, 2010 11:18 AM    New/Updated Medications: TESTIM 1 % GEL (TESTOSTERONE) 5 grams once daily Prescriptions: TESTIM 1 % GEL (TESTOSTERONE) 5 grams once daily  #30 x 3   Entered and Authorized by:   Minus Breeding MD   Signed by:   Minus Breeding MD on 06/17/2010   Method used:   Print then Give to Patient   RxID:   [id number] TESTIM 1 % GEL (TESTOSTERONE) 5 grams once daily  #30 x 2   Entered and Authorized by:   Minus Breeding MD   Signed by:   Minus Breeding MD on 06/17/2010   Method used:   Print then Give to Patient   RxID:   [id number]

## 2010-12-28 NOTE — Assessment & Plan Note (Signed)
Summary: 1 MONTH/APC   Copy to:  Dr. Romero Belling Primary Provider/Referring Provider:  Tinnie Gens Todd,MD  CC:  1 month followup.  Pt states that he is doing better with cpap.  He states that he is sleeping about 6-7 hours at night.  He states that he still takes a nap every day when he gets home from work..  History of Present Illness: 58/M ,smoker,  hypertensive on 4 meds & diabetic for FU of severe obstructive sleep apnea. Epworth Sleepiness Score is 15. He reports loud snoring, wife has moved out of the bedroom. He naps on his recliner after coming back from work & ofetn on weekends, naps are non refreshing. Sleep latency is 15 mins, 5-6 awakenings , wakes up tired, dry mouth, lets the dog out & naps until 7A.Drinks 3-4 cups of coffee daily & decaffeinated tea. He moves around a lot during his sleep.  December 07, 2009 4:01 PM   d/w pt, trial of auto cpap 10-20, lg FF mask, humidity BiPAP 24/16 required during study, could not tolerate higher pressures during study. He has been able to use CPAP x 6 nights, upto 7hrs/night, occasionally wakes up wiht higher pressure. Not feeling fully rested yet. Mouth dry, no nasal congestion.  January 06, 2010 2:17 PM  download 1/3-1/18 >> avg pr 13.5, residual AHI 15, good compliance he is doing better with cpap.  He states that he is sleeping about 6-7 hours at night.  He states that he still takes a nap every day when he gets home from work. Mask ok, pressure ok   Current Medications (verified): 1)  Allopurinol 300 Mg Tabs (Allopurinol) .Marland Kitchen.. 1 Once Daily 2)  Furosemide 80 Mg Tabs (Furosemide) .... Take 1 Tablet By Mouth Two Times A Day 3)  Humalog Kwikpen 100 Unit/ml Soln (Insulin Lispro (Human)) .... Three Times A Day (Qac) 30-50-80 4)  Indomethacin Cr 75 Mg Cpcr (Indomethacin) .Marland Kitchen.. 1 Capsule By Mouth Q Am 5)  Micardis 80 Mg Tabs (Telmisartan) .... 2 Tablet By Mouth Once A Day 6)  Simvastatin 80 Mg  Tabs (Simvastatin) .... Take 1/2 Tab By Mouth   Every Morning 7)  Cardura 8 Mg  Tabs (Doxazosin Mesylate) .... Take 1 Tablet By Mouth Once A Day 8)  Aspirin 325 Mg  Tbec (Aspirin) .... Once Daily 9)  Zestril 40 Mg  Tabs (Lisinopril) .... Take 1 Tablet By Mouth Two Times A Day 10)  Coreg 3.125 Mg  Tabs (Carvedilol) .... Take 1 Tablet By Mouth Every Morning 11)  Onetouch Ultra Test   Strp (Glucose Blood) .... Use 1 Strip Three Times A Day 12)  Novolin N 100 Unit/ml  Susp (Insulin Isophane Human) .Marland Kitchen.. 100 Units Qhs 13)  Bd Insulin Syringe 29g X 1/2" 1 Ml  Misc (Insulin Syringe-Needle U-100) .... Use As Directed 14)  Viagra 100 Mg Tabs (Sildenafil Citrate) .... Uad 15)  Ra Pen Needles 31g X 5 Mm Misc (Insulin Pen Needle) .... Use 1 3 Times Daily 16)  Amlodipine Besylate 10 Mg Tabs (Amlodipine Besylate) .... Take One Tab Once Daily  Allergies (verified): No Known Drug Allergies  Past History:  Past Medical History: Last updated: 12/10/2008 Hyperlipidemia Hypertension Gout DM Nephropathy Diabetes mellitus, type I erectile dysfunction, nonresponsive to oral medication diabetic retinopathy OU, laser surgery, Dr. 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 Quit this morning 10/02/08  Went to seminar to quit  Risk Factors: Smoking Status: current (11/05/2009) Packs/Day: 1.0 (11/05/2009)  Review of Systems  The patient denies anorexia, fever, weight loss, weight gain, vision loss, decreased hearing, hoarseness, chest pain, syncope, dyspnea on exertion, peripheral edema, prolonged cough, headaches, hemoptysis, abdominal pain, melena, hematochezia, severe indigestion/heartburn, hematuria, muscle weakness, difficulty walking, depression, unusual weight change, and abnormal bleeding.    Vital Signs:  Patient profile:   60 year old male Weight:      290 pounds O2 Sat:       98 % on Room air Temp:     97.8 degrees F oral Pulse rate:   89 / minute BP sitting:   114 / 60  (left arm) Cuff size:   large  Vitals Entered By: Vernie Murders (January 06, 2010 2:03 PM)  O2 Flow:  Room air  Physical Exam  Additional Exam:  Gen. Pleasant, well-nourished, in no distress, normal affect ENT - no lesions, no post nasal drip, class 3 airway Neck: No JVD, no thyromegaly, no carotid bruits Lungs: no use of accessory muscles, no dullness to percussion, clear without rales or rhonchi  Cardiovascular: Rhythm regular, heart sounds  normal, no murmurs or gallops, no peripheral edema Musculoskeletal: No deformities, no cyanosis or clubbing      Impression & Recommendations:  Problem # 1:  SLEEP APNEA (ICD-780.57) Compliance encouraged, wt loss emphasized, asked to avoid meds with sedative side effects, cautioned against driving when sleepy. Trial of Nuvigil at 11 am on weekdays for EDS  that persists  . Discussed that CPAP compliance is essential formedication to work. Orders: DME Referral (DME) Est. Patient Level III (16109)  Patient Instructions: 1)  Copy sent to: 2)  Please schedule a follow-up appointment in 3 months. 3)  Trial of Nuvigil at 11 am on weekdays - call back & let me know 4)  We will check download & let you know of changes

## 2010-12-28 NOTE — Progress Notes (Signed)
Summary: cpap mask  Phone Note Call from Patient   Caller: Patient Call For: LIBBY Summary of Call: pt says that he was given "the same mask that he had before" for his cpap. says he spoke to michele at [facility] today and was told that this was the size/ type that they were given. pt # [id number] NOTE: pt has already gone to sleep center and been fitted for a mask Initial call taken by: Tivis Ringer, CNA,  Apr 05, 2010 4:05 PM  Follow-up for Phone Call        called sleep ctr to get the name of the mask and it was large resmed quattro ff FX mask order refaxed to [facility] Follow-up by: Oneita Jolly,  Apr 06, 2010 9:24 AM

## 2010-12-28 NOTE — Assessment & Plan Note (Signed)
Summary: 4 MTH FU  STC   Vital Signs:  Patient profile:   60 year old male Height:      70 inches (177.80 cm) Weight:      285 pounds (129.55 kg) BMI:     41.04 O2 Sat:      95 % on Room air Temp:     98.0 degrees F (36.67 degrees C) oral Pulse rate:   58 / minute BP sitting:   112 / 62  (left arm) Cuff size:   large  Vitals Entered By: Brenton Grills MA (June 11, 2010 8:00 AM)  O2 Flow:  Room air CC: 4 mo F/U/aj   Referring Provider:  Dr. Romero Belling Primary Provider:  Tinnie Gens Todd,MD  CC:  4 mo F/U/aj.  History of Present Illness: pt states he feels well in general, except for fatigue.  no cbg record, but states cbg's are well-controlled, except for mild hypoglycemia if he eats a smaller-then-expected meal.  this can happen at any time of day.    Current Medications (verified): 1)  Allopurinol 300 Mg Tabs (Allopurinol) .Marland Kitchen.. 1 Once Daily 2)  Furosemide 80 Mg Tabs (Furosemide) .... Take 1 Tablet By Mouth Two Times A Day 3)  Humalog Kwikpen 100 Unit/ml Soln (Insulin Lispro (Human)) .... Three Times A Day (Qac) 30-40-80 4)  Indomethacin Cr 75 Mg Cpcr (Indomethacin) .Marland Kitchen.. 1 Capsule By Mouth Q Am 5)  Micardis 80 Mg Tabs (Telmisartan) .... 2 Tablet By Mouth Once A Day 6)  Simvastatin 80 Mg  Tabs (Simvastatin) .... Take 1/2 Tab By Mouth  Every Morning 7)  Cardura 8 Mg  Tabs (Doxazosin Mesylate) .... Take 1 Tablet By Mouth Once A Day 8)  Aspirin 325 Mg  Tbec (Aspirin) .... Once Daily 9)  Zestril 40 Mg  Tabs (Lisinopril) .... Take 1 Tablet By Mouth Two Times A Day 10)  Coreg 3.125 Mg  Tabs (Carvedilol) .... Take 1 Tablet By Mouth Every Morning 11)  Onetouch Ultra Test   Strp (Glucose Blood) .... Use 1 Strip Three Times A Day 12)  Novolin N 100 Unit/ml  Susp (Insulin Isophane Human) .Marland Kitchen.. 100 Units Qhs 13)  Bd Insulin Syringe 29g X 1/2" 1 Ml  Misc (Insulin Syringe-Needle U-100) .... Use As Directed 14)  Viagra 100 Mg Tabs (Sildenafil Citrate) .... Uad 15)  Ra Pen Needles 31g X 5 Mm  Misc (Insulin Pen Needle) .... Use 1 3 Times Daily 16)  Amlodipine Besylate 10 Mg Tabs (Amlodipine Besylate) .... Take One Tab Once Daily  Allergies (verified): No Known Drug Allergies  Past History:  Past Medical History: Last updated: 12/10/2008 Hyperlipidemia Hypertension Gout DM Nephropathy Diabetes mellitus, type I erectile dysfunction, nonresponsive to oral medication diabetic retinopathy OU, laser surgery, Dr. Luciana Axe Colon polyps-type unknown 2002  Renal insufficiency  Review of Systems  The patient denies syncope.    Physical Exam  General:  obese.  no distress  Pulses:  dorsalis pedis intact bilat.   Extremities:  no deformity.  no ulcer on the feet.  feet are of normal color and temp.  no edema  Neurologic:  sensation is intact to touch on the feet    Impression & Recommendations:  Problem # 1:  DIABETES MELLITUS, TYPE I (ICD-250.01) overcontrolled  Problem # 2:  HYPOGONADISM (ICD-257.2) Assessment: New  Problem # 3:  FATIGUE (ICD-780.79)  Medications Added to Medication List This Visit: 1)  Humalog Kwikpen 100 Unit/ml Soln (Insulin lispro (human)) .... Three times a day (qac) 25-35-75 units  Other Orders: TLB-A1C / Hgb A1C (Glycohemoglobin) (83036-A1C) TLB-Testosterone, Total (84403-TESTO) Est. Patient Level III (13086)  Patient Instructions: 1)  tests are being ordered for you today.  a few days after the test(s), please call [phone] to hear your test results. 2)  pending the test results, please continue nph (60 units at night), and same humalog to (just before each meal) 30-40-80 units, and same nph.   3)  return 4 months 4)  check your blood sugar 2 times a day.  vary the time of day when you check, between before the 3 meals, and at bedtime.  also check if you have symptoms of your blood sugar being too high or too low.  please keep a record of the readings and bring it to your next appointment here.  please call us sooner if you are having  low blood sugar episodes. 5)  (update: i left message on phone-tree:  reduce humalog to (just before each meal), 25-35-75 units.  go to lab for fsh, lh, and prolactin, 257.2)

## 2010-12-28 NOTE — Assessment & Plan Note (Signed)
Summary: 6 months/apc   Visit Type:  Follow-up Copy to:  Dr. Romero Belling Primary Provider/Referring Provider:  Tinnie Gens Todd,MD  CC:  Pt here for 6 month follow up. Pt request flu vax.  History of Present Illness: 60/M ,smoker,  hypertensive on 4 meds & diabetic for FU of severe obstructive sleep apnea.  December 07, 2009 4:01 PM   trial of auto cpap 10-20, lg FF mask, humidity BiPAP 24/16 required during study, could not tolerate higher pressures during study. He has been able to use CPAP x 6 nights, upto 7hrs/night, occasionally wakes up wiht higher pressure. Not feeling fully rested yet. Mouth dry, no nasal congestion.   download 1/3-1/18 >> avg pr 13.5, residual AHI 15, good compliance reviewed download 2/1 - 3/10 on auto 10-20 >> avg pr 17, AHI 9/h (better), good compliance He is feeling better rested. Mask ok, pressure ok but leak persists Continues to smoke 4/4 desensitized with large resmed quattro FF mask  August 25, 2010 9:11 AM  Finally got full face mask, comfortable, claims compliance, pressure ok, no leak. Nuvigil made him jittery - so stopped.  Preventive Screening-Counseling & Management  Alcohol-Tobacco     Alcohol drinks/day: 1     Alcohol type: spirits     Smoking Status: current     Packs/Day: 1.0     Year Started: 1967  Current Medications (verified): 1)  Allopurinol 300 Mg Tabs (Allopurinol) .Marland Kitchen.. 1 Once Daily 2)  Furosemide 80 Mg Tabs (Furosemide) .... Take 1 Tablet By Mouth Two Times A Day 3)  Humalog Kwikpen 100 Unit/ml Soln (Insulin Lispro (Human)) .... Three Times A Day (Qac) 25-35-75 Units 4)  Indomethacin Cr 75 Mg Cpcr (Indomethacin) .... Take 1 Tablet By Mouth Every Morning 5)  Micardis 80 Mg Tabs (Telmisartan) .... 2 Tablet By Mouth Once A Day 6)  Simvastatin 80 Mg  Tabs (Simvastatin) .... Take 1/2 Tab By Mouth  Every Morning 7)  Cardura 8 Mg  Tabs (Doxazosin Mesylate) .... Take 1 Tablet By Mouth Once A Day 8)  Aspirin 325 Mg  Tbec  (Aspirin) .... Once Daily 9)  Zestril 40 Mg  Tabs (Lisinopril) .... Take 1 Tablet By Mouth Two Times A Day 10)  Coreg 3.125 Mg  Tabs (Carvedilol) .... Take 1 Tablet By Mouth Every Morning 11)  Onetouch Ultra Test   Strp (Glucose Blood) .... Use 1 Strip Three Times A Day 12)  Novolin N 100 Unit/ml  Susp (Insulin Isophane Human) .... 60 Units At Bedtime. 13)  Bd Insulin Syringe 29g X 1/2" 1 Ml  Misc (Insulin Syringe-Needle U-100) .... Use As Directed 14)  Viagra 100 Mg Tabs (Sildenafil Citrate) .... Uad 15)  Ra Pen Needles 31g X 5 Mm Misc (Insulin Pen Needle) .... Use 1 3 Times Daily 16)  Amlodipine Besylate 10 Mg Tabs (Amlodipine Besylate) .... Take One Tab Once Daily 17)  Testim 1 % Gel (Testosterone) .... 5 Grams Once Daily  Allergies (verified): No Known Drug Allergies  Past History:  Past Medical History: Last updated: 12/10/2008 Hyperlipidemia Hypertension Gout DM Nephropathy Diabetes mellitus, type I erectile dysfunction, nonresponsive to oral medication diabetic retinopathy OU, laser surgery, Dr. 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 Quit this morning 10/02/08 Went to seminar to quit  Review of Systems  The patient denies anorexia, fever, weight loss, weight gain, vision loss, decreased hearing, hoarseness,  chest pain, syncope, dyspnea on exertion, peripheral edema, prolonged cough, headaches, hemoptysis, abdominal pain, melena, hematochezia, severe indigestion/heartburn, hematuria, suspicious skin lesions, difficulty walking, depression, unusual weight change, abnormal bleeding, enlarged lymph nodes, and angioedema.    Vital Signs:  Patient profile:   60 year old male Height:      70 inches Weight:      288.38 pounds BMI:     41.53 O2 Sat:      96 % on Room air Temp:     98.2 degrees F oral Pulse  rate:   62 / minute BP sitting:   132 / 74  (left arm) Cuff size:   large  Vitals Entered By: Zackery Barefoot CMA (August 25, 2010 9:00 AM)  O2 Flow:  Room air CC: Pt here for 6 month follow up. Pt request flu vax Comments Medications reviewed with patient Verified contact number and pharmacy with patient Zackery Barefoot [facility]  August 25, 2010 9:00 AM    Physical Exam  Additional Exam:  wt 288 August 25, 2010 -unchanged from 3/11 Gen. Pleasant, well-nourished, in no distress, normal affect ENT - no lesions, no post nasal drip, class 3 airway Neck: No JVD, no thyromegaly, no carotid bruits Lungs: no use of accessory muscles, no dullness to percussion, clear without rales or rhonchi  Cardiovascular: Rhythm regular, heart sounds  normal, no murmurs or gallops, no peripheral edema Musculoskeletal: No deformities, no cyanosis or clubbing      Impression & Recommendations:  Problem # 1:  SLEEP APNEA (ICD-780.57) ct CPAP same settings Compliance encouraged, wt loss emphasized, asked to avoid meds with sedative side effects, cautioned against driving when sleepy.  Best to focus on wt loss here. Anual FU from here on - he will call for problems Orders: Est. Patient Level III (40347) DME Referral (DME)  Medications Added to Medication List This Visit: 1)  Indomethacin Cr 75 Mg Cpcr (Indomethacin) .... Take 1 tablet by mouth every morning 2)  Novolin N 100 Unit/ml Susp (Insulin isophane human) .... 60 units at bedtime.  Patient Instructions: 1)  Copy sent to: 2)  Please schedule a follow-up appointment in 1 year. 3)  Call if you lose > 30 lbs & we will check download 4)  flu shot    Appended Document: 6 months/apc     Clinical Lists Changes  Flu Vaccine Consent Questions     Do you have a history of severe allergic reactions to this vaccine? no    Any prior history of allergic reactions to egg and/or gelatin? no    Do you have a sensitivity to the preservative  Thimersol? no    Do you have a past history of Guillan-Barre Syndrome? no    Do you currently have an acute febrile illness? no    Have you ever had a severe reaction to latex? no    Vaccine information given and explained to patient? yes    Are you currently pregnant? no    Lot Number:[id number]   Exp Date:05/28/2011   Site Given  Left Deltoid IM   Orders: Added new Service order of Admin 1st Vaccine (42595) - Signed Added new Service order of Flu Vaccine 47yrs + [phone]) - Signed Observations: Added new observation of FLU VAX VIS: 06/22/10 version (08/25/2010 9:30) Added new observation of FLU VAXLOT: [id number] (08/25/2010 9:30) Added new observation of FLU VAXMFR: [employer] (08/25/2010 9:30) Added new observation of FLU VAX EXP: 05/28/2011 (08/25/2010 9:30) Added new observation of FLU VAX DSE: 0.78ml (  08/25/2010 9:30) Added new observation of FLU VAX: Fluvax 3+ (08/25/2010 9:30)      Appended Document: 6 months/apc download 1/3-9/28/11 >> compliance OK, residual AHI 9/h, avg pr 17 cm

## 2010-12-28 NOTE — Letter (Signed)
Summary: [facility]  [facility]   Imported By: Maryln Gottron 03/15/2010 15:47:46  _____________________________________________________________________  External Attachment:    Type:   Image     Comment:   External Document

## 2010-12-28 NOTE — Progress Notes (Signed)
Summary: C PAP  Phone Note Call from Patient Call back at [phone]   Caller: Patient Call For: ALVA Summary of Call: PT WAS TOLD BY DR Vassie Loll IF HE DIDN'T GET A CALL ABOUT C PAP MACHINE Initial call taken by: Rickard Patience,  November 30, 2009 1:52 PM  Follow-up for Phone Call        pt states he has not heard from home care company, dr Vassie Loll told him if he had not heard from them by today to call our office back--msg sent to [facility] for f/u Follow-up by: Philipp Deputy CMA,  November 30, 2009 2:17 PM  Additional Follow-up for Phone Call Additional follow up Details #1::        pt notified [facility] is waiting a auth from [place] which is his ins and he was given the number to call [facility]  Additional Follow-up by: Oneita Jolly,  November 30, 2009 3:15 PM

## 2010-12-28 NOTE — Assessment & Plan Note (Signed)
Summary: CPAP F?U.....Marland KitchenEA   Visit Type:  Follow-up Copy to:  Dr. Romero Belling Primary Provider/Referring Provider:  Tinnie Gens Todd,MD  CC:  Pt here for follow up. Pt states is using CPAP machine every night approx 6 to 7 hours..  History of Present Illness: 59/M ,smoker,  hypertensive on 4 meds & diabetic for FU of severe obstructive sleep apnea.  December 07, 2009 4:01 PM   trial of auto cpap 10-20, lg FF mask, humidity BiPAP 24/16 required during study, could not tolerate higher pressures during study. He has been able to use CPAP x 6 nights, upto 7hrs/night, occasionally wakes up wiht higher pressure. Not feeling fully rested yet. Mouth dry, no nasal congestion.  January 06, 2010 2:17 PM  download 1/3-1/18 >> avg pr 13.5, residual AHI 15, good compliance he is doing better with cpap.  He states that he is sleeping about 6-7 hours at night.  He states that he still takes a nap every day when he gets home from work.   February 26, 2010 10:08 AM  reviewed download 2/1 - 3/10 on auto 10-20 >> avg pr 17, AHI 9/h (better), good compliance He is feeling better rested. Mask ok, pressure ok but leak persists Continues to smoke  Current Medications (verified): 1)  Allopurinol 300 Mg Tabs (Allopurinol) .Marland Kitchen.. 1 Once Daily 2)  Furosemide 80 Mg Tabs (Furosemide) .... Take 1 Tablet By Mouth Two Times A Day 3)  Humalog Kwikpen 100 Unit/ml Soln (Insulin Lispro (Human)) .... Three Times A Day (Qac) 30-40-80 4)  Indomethacin Cr 75 Mg Cpcr (Indomethacin) .Marland Kitchen.. 1 Capsule By Mouth Q Am 5)  Micardis 80 Mg Tabs (Telmisartan) .... 2 Tablet By Mouth Once A Day 6)  Simvastatin 80 Mg  Tabs (Simvastatin) .... Take 1/2 Tab By Mouth  Every Morning 7)  Cardura 8 Mg  Tabs (Doxazosin Mesylate) .... Take 1 Tablet By Mouth Once A Day 8)  Aspirin 325 Mg  Tbec (Aspirin) .... Once Daily 9)  Zestril 40 Mg  Tabs (Lisinopril) .... Take 1 Tablet By Mouth Two Times A Day 10)  Coreg 3.125 Mg  Tabs (Carvedilol) .... Take 1 Tablet  By Mouth Every Morning 11)  Onetouch Ultra Test   Strp (Glucose Blood) .... Use 1 Strip Three Times A Day 12)  Novolin N 100 Unit/ml  Susp (Insulin Isophane Human) .Marland Kitchen.. 100 Units Qhs 13)  Bd Insulin Syringe 29g X 1/2" 1 Ml  Misc (Insulin Syringe-Needle U-100) .... Use As Directed 14)  Viagra 100 Mg Tabs (Sildenafil Citrate) .... Uad 15)  Ra Pen Needles 31g X 5 Mm Misc (Insulin Pen Needle) .... Use 1 3 Times Daily 16)  Amlodipine Besylate 10 Mg Tabs (Amlodipine Besylate) .... Take One Tab Once Daily  Allergies (verified): No Known Drug Allergies  Past History:  Past Medical History: Last updated: 12/10/2008 Hyperlipidemia Hypertension Gout DM Nephropathy Diabetes mellitus, type I erectile dysfunction, nonresponsive to oral medication diabetic retinopathy OU, laser surgery, Dr. 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 Quit this morning 10/02/08 Went to seminar to quit  Risk Factors: Smoking Status: current (11/05/2009) Packs/Day: 1.0 (11/05/2009)  Review of Systems  The patient denies anorexia, fever, weight loss, weight gain, vision loss, decreased hearing, hoarseness, chest pain, syncope, dyspnea on exertion, peripheral edema, prolonged cough, headaches, hemoptysis, abdominal pain, melena, hematochezia, severe indigestion/heartburn, hematuria, muscle weakness, suspicious skin  lesions, difficulty walking, depression, unusual weight change, and abnormal bleeding.    Vital Signs:  Patient profile:   60 year old male Height:      70 inches Weight:      288.50 pounds O2 Sat:      92 % on Room air Temp:     98.4 degrees F oral Pulse rate:   63 / minute BP sitting:   122 / 60  (left arm) Cuff size:   large  Vitals Entered By: Zackery Barefoot CMA (February 26, 2010 9:50 AM)  O2 Flow:  Room air CC: Pt here for  follow up. Pt states is using CPAP machine every night approx 6 to 7 hours. Comments Medications reviewed with patient Verified contact number and pharmacy with patient Zackery Barefoot CMA  February 26, 2010 9:53 AM    Physical Exam  Additional Exam:  Gen. Pleasant, well-nourished, in no distress, normal affect ENT - no lesions, no post nasal drip, class 3 airway Neck: No JVD, no thyromegaly, no carotid bruits Lungs: no use of accessory muscles, no dullness to percussion, clear without rales or rhonchi  Cardiovascular: Rhythm regular, heart sounds  normal, no murmurs or gallops, no peripheral edema Musculoskeletal: No deformities, no cyanosis or clubbing      Impression & Recommendations:  Problem # 1:  SLEEP APNEA (ICD-780.57)  Compliance encouraged, wt loss emphasized, asked to avoid meds with sedative side effects, cautioned against driving when sleepy.  ct auto CPAP 10-20 To sleep lab for better mask fit  Orders: Est. Patient Level III (11914) Prescription Created Electronically [phone])  Problem # 2:  TOBACCO ABUSE (ICD-305.1)  Not ready to commit to a quit date Has quit with wellbutrin in the past but not ready to start this now. His updated medication list for this problem includes:    Nicotrol 10 Mg Inha (Nicotine) .Marland Kitchen..Marland Kitchen Two times a day  Orders: Est. Patient Level III (62130) Prescription Created Electronically [phone])  Medications Added to Medication List This Visit: 1)  Nicotrol 10 Mg Inha (Nicotine) .... Two times a day  Patient Instructions: 1)  Copy sent to: Todd 2)  Please schedule a follow-up appointment in 6 months. 3)  Go over to the sleep lab for mask fit 4)  Nicotine inhaler Rx sent to pharmacy Prescriptions: NICOTROL 10 MG INHA (NICOTINE) two times a day  #10 x 1   Entered and Authorized by:   Comer Locket. Vassie Loll MD   Signed by:   Comer Locket Vassie Loll MD on 02/26/2010   Method used:   Electronically to        [facility] [phone]* (retail)        [address]       [place], [place]  [address]       Ph: [phone]       Fax: [phone]   RxID:   [id number]       Appended Document: CPAP F?U......EA 4/4 desensitized with large resmed quattro FF mask

## 2010-12-28 NOTE — Assessment & Plan Note (Signed)
Summary: cpx/njr   Vital Signs:  Patient profile:   60 year old male Height:      69.25 inches Weight:      266 pounds Temp:     98.8 degrees F oral BP sitting:   110 / 68  (left arm) Cuff size:   regular  Vitals Entered By: Kern Reap CMA Duncan Dull) (September 17, 2010 8:36 AM) CC: cpx Is Patient Diabetic? Yes Did you bring your meter with you today? No Pain Assessment Patient in pain? no        Primary Care Provider:  Tinnie Gens Teagan Heidrick,MD  CC:  cpx.  History of Present Illness: Elige is a 70-year-old, married male, nonsmoker, who comes in today for physical right region.  Because of underlying metabolic syndrome...Marland KitchenMarland Kitchen. diabetes type 1, hypertension, hyperlipidemia, obesity, erectile dysfunction, and osteoarthritis.  For hypertension.  He takes Cardura 8 mg nightly, Zestril, 40 mg b.i.d., my card, is 80.  Does to tabs daily........Marland Kitchen we will switch to Cozaar because the cost......Marland Kitchen Lasix 80 mg b.i.d. Corag 3.125 mg daily, Viagra 100 mg p.r.n., Norvasc, 10 mg daily, he sees Dr. Rennis Harding and his endocrinologist every 6 months.  He is on Novolin 60 units at bedtime with a sliding scale of Humalog before each meal.  Also, because of hypogonadism, is a testim.Marland Kitchen  He also takes simvastatin 40 mg nightly for hyperlipidemia, and one aspirin tablet.  He takes allopurinol 300 mg daily to prevent gout.  Tetanus booster 2003, seasonal flu 2011, Pneumovax 2007.  He sees his ophthalmologist, Dr. Allyne Gee.  Every 6 months.  He has had laser surgery to his right eye by Dr. Luciana Axe.  He gets routine dental care, colonoscopy x 2, because the original showed 3 polyps, follow-up colonoscopy normal.  He also continues to smoke 10 cigarettes per day.  His wife is a nonsmoker.  Last year.  We tried him on a trial of chantix.  He stopped it.  He said it gave him a headache.  We explained to him in the past that he has side effects of the medicine call, and we will work with him.  He never called back,  he did stop the  medicine  Allergies (verified): No Known Drug Allergies  Past History:  Past medical, surgical, family and social histories (including risk factors) reviewed, and no changes noted (except as noted below).  Past Medical History: Reviewed history from 12/10/2008 and no changes required. Hyperlipidemia Hypertension Gout DM Nephropathy Diabetes mellitus, type I erectile dysfunction, nonresponsive to oral medication diabetic retinopathy OU, laser surgery, Dr. Luciana Axe Colon polyps-type unknown 2002  Renal insufficiency  Past Surgical History: Reviewed history from 10/02/2008 and no changes required. Unremarkable  Family History: Reviewed history from 10/02/2008 and no changes required. Family History Diabetes 1st degree relative Family History High cholesterol Family History Hypertension No FH of Colon Cancer:  Social History: Reviewed history from 10/02/2008 and no changes required. Occupation:   Sales Married Alcohol use-no Drug use-no Regular exercise-no Patient is a former smoker. Quit this morning 10/02/08 Went to seminar to quit  Review of Systems      See HPI  Physical Exam  General:  Well-developed,well-nourished,in no acute distress; alert,appropriate and cooperative throughout examination Head:  Normocephalic and atraumatic without obvious abnormalities. No apparent alopecia or balding. Eyes:  No corneal or conjunctival inflammation noted. EOMI. Perrla. Funduscopic exam benign, without hemorrhages, exudates or papilledema. Vision grossly normal. Ears:  External ear exam shows no significant lesions or deformities.  Otoscopic examination reveals clear  canals, tympanic membranes are intact bilaterally without bulging, retraction, inflammation or discharge. Hearing is grossly normal bilaterally. Nose:  External nasal examination shows no deformity or inflammation. Nasal mucosa are pink and moist without lesions or exudates. Mouth:  Oral mucosa and oropharynx without  lesions or exudates.  Teeth in good repair. Neck:  No deformities, masses, or tenderness noted. Chest Wall:  No deformities, masses, tenderness or gynecomastia noted. Breasts:  No masses or gynecomastia noted Lungs:  decreased breath sounds bilaterally.  Pulmonary functions pending Heart:  Normal rate and regular rhythm. S1 and S2 normal without gallop, murmur, click, rub or other extra sounds.heart sounds distant Abdomen:  Bowel sounds positive,abdomen soft and non-tender without masses, organomegaly or hernias noted. Rectal:  No external abnormalities noted. Normal sphincter tone. No rectal masses or tenderness. Genitalia:  Testes bilaterally descended without nodularity, tenderness or masses. No scrotal masses or lesions. No penis lesions or urethral discharge. Prostate:  Prostate gland firm and smooth, no enlargement, nodularity, tenderness, mass, asymmetry or induration. Msk:  No deformity or scoliosis noted of thoracic or lumbar spine.   Pulses:  no carotid bruits.  Distal pulses 1+ Extremities:  No clubbing, cyanosis, edema, or deformity noted with normal full range of motion of all joints.   Neurologic:  No cranial nerve deficits noted. Station and gait are normal. Plantar reflexes are down-going bilaterally. DTRs are symmetrical throughout. Sensory, motor and coordinative functions appear intact. Skin:  Intact without suspicious lesions or rashes Cervical Nodes:  No lymphadenopathy noted Axillary Nodes:  No palpable lymphadenopathy Inguinal Nodes:  No significant adenopathy Psych:  Cognition and judgment appear intact. Alert and cooperative with normal attention span and concentration. No apparent delusions, illusions, hallucinations  Diabetes Management Exam:    Foot Exam (with socks and/or shoes not present):       Sensory-Pinprick/Light touch:          Left medial foot (L-4): normal          Left dorsal foot (L-5): normal          Left lateral foot (S-1): normal          Right  medial foot (L-4): normal          Right dorsal foot (L-5): normal          Right lateral foot (S-1): normal       Sensory-Monofilament:          Left foot: normal          Right foot: normal       Inspection:          Left foot: normal          Right foot: normal       Nails:          Left foot: normal          Right foot: normal    Eye Exam:       Eye Exam done elsewhere          Date: 08/20/2010          Results: diabetic retinopathy          Done by: saunders   Impression & Recommendations:  Problem # 1:  TOBACCO ABUSE (ICD-305.1) Assessment Unchanged  His updated medication list for this problem includes:    Chantix Continuing Month Pak 1 Mg Tabs (Varenicline tartrate) ..... Uad  Orders: Prescription Created Electronically [phone] Tobacco use cessation intensive >10 minutes (63875) Spirometry w/Graph (94010)  Problem #  2:  DIABETES MELLITUS, TYPE I (ICD-250.01) Assessment: Improved  The following medications were removed from the medication list:    Micardis 80 Mg Tabs (Telmisartan) .Marland Kitchen... 2 tablet by mouth once a day His updated medication list for this problem includes:    Humalog Kwikpen 100 Unit/ml Soln (Insulin lispro (human)) .Marland Kitchen... Three times a day (qac) 25-35-75 units    Aspirin 325 Mg Tbec (Aspirin) ..... Once daily    Zestril 40 Mg Tabs (Lisinopril) .Marland Kitchen... Take 1 tablet by mouth two times a day    Novolin N 100 Unit/ml Susp (Insulin isophane human) .Marland KitchenMarland KitchenMarland KitchenMarland Kitchen 60 units at bedtime.    Losartan Potassium 100 Mg Tabs (Losartan potassium) .Marland Kitchen... Take 1 tablet by mouth every morning  Problem # 3:  ORGANIC IMPOTENCE (ICD-607.84) Assessment: Improved  His updated medication list for this problem includes:    Viagra 100 Mg Tabs (Sildenafil citrate) ..... Uad  Orders: Prescription Created Electronically [phone]) Tobacco use cessation intensive >10 minutes (82956)  Problem # 4:  GOUT (ICD-274.9) Assessment: Improved  His updated medication list for this problem  includes:    Allopurinol 300 Mg Tabs (Allopurinol) .Marland Kitchen... 1 once daily  Orders: Prescription Created Electronically [phone]) Tobacco use cessation intensive >10 minutes (65784)  Problem # 5:  PREVENTIVE HEALTH CARE (ICD-V70.0) Assessment: Unchanged  Orders: Prescription Created Electronically [phone]) Tobacco use cessation intensive >10 minutes (52841)  Problem # 6:  HYPERTENSION (ICD-401.9) Assessment: Improved  The following medications were removed from the medication list:    Micardis 80 Mg Tabs (Telmisartan) .Marland Kitchen... 2 tablet by mouth once a day His updated medication list for this problem includes:    Furosemide 80 Mg Tabs (Furosemide) .Marland Kitchen... Take 1 tablet by mouth two times a day    Cardura 8 Mg Tabs (Doxazosin mesylate) .Marland Kitchen... Take 1 tablet by mouth once a day    Zestril 40 Mg Tabs (Lisinopril) .Marland Kitchen... Take 1 tablet by mouth two times a day    Coreg 3.125 Mg Tabs (Carvedilol) .Marland Kitchen... Take 1 tablet by mouth every morning    Amlodipine Besylate 10 Mg Tabs (Amlodipine besylate) .Marland Kitchen... Take one tab once daily    Losartan Potassium 100 Mg Tabs (Losartan potassium) .Marland Kitchen... Take 1 tablet by mouth every morning  Orders: Prescription Created Electronically [phone]) Tobacco use cessation intensive >10 minutes (10272) EKG w/ Interpretation (93000)  Problem # 7:  HYPERLIPIDEMIA (ICD-272.4) Assessment: Improved  His updated medication list for this problem includes:    Simvastatin 80 Mg Tabs (Simvastatin) .Marland Kitchen... Take 1/2 tab by mouth  every morning  Orders: Prescription Created Electronically [phone]) Tobacco use cessation intensive >10 minutes (40347) EKG w/ Interpretation (93000)  Complete Medication List: 1)  Allopurinol 300 Mg Tabs (Allopurinol) .Marland Kitchen.. 1 once daily 2)  Furosemide 80 Mg Tabs (Furosemide) .... Take 1 tablet by mouth two times a day 3)  Humalog Kwikpen 100 Unit/ml Soln (Insulin lispro (human)) .... Three times a day (qac) 25-35-75 units 4)  Indomethacin Cr 75 Mg Cpcr  (Indomethacin) .... Take 1 tablet by mouth every morning 5)  Simvastatin 80 Mg Tabs (Simvastatin) .... Take 1/2 tab by mouth  every morning 6)  Cardura 8 Mg Tabs (Doxazosin mesylate) .... Take 1 tablet by mouth once a day 7)  Aspirin 325 Mg Tbec (Aspirin) .... Once daily 8)  Zestril 40 Mg Tabs (Lisinopril) .... Take 1 tablet by mouth two times a day 9)  Coreg 3.125 Mg Tabs (Carvedilol) .... Take 1 tablet by mouth every morning 10)  Onetouch Ultra Test Strp (  Glucose blood) .... Use 1 strip three times a day 11)  Novolin N 100 Unit/ml Susp (Insulin isophane human) .... 60 units at bedtime. 12)  Bd Insulin Syringe 29g X 1/2" 1 Ml Misc (Insulin syringe-needle u-100) .... Use as directed 13)  Viagra 100 Mg Tabs (Sildenafil citrate) .... Uad 14)  Ra Pen Needles 31g X 5 Mm Misc (Insulin pen needle) .... Use 1 3 times daily 15)  Amlodipine Besylate 10 Mg Tabs (Amlodipine besylate) .... Take one tab once daily 16)  Testim 1 % Gel (Testosterone) .... 5 grams once daily 17)  Losartan Potassium 100 Mg Tabs (Losartan potassium) .... Take 1 tablet by mouth every morning 18)  Chantix Continuing Month Pak 1 Mg Tabs (Varenicline tartrate) .... Uad  Patient Instructions: 1)  the most important thing, you can do for your health to stop smoking. 2)  Begin the chantix  half a tablet daily in the morning.  Return to see me in 4 weeks for follow-up and I will work with you to get off the cigarettes 3)  Take an Aspirin every day. 4)  Stopping mycardis, and began losartan 100 mg q.a.m. Prescriptions: AMLODIPINE BESYLATE 10 MG TABS (AMLODIPINE BESYLATE) take one tab once daily  #100 x 3   Entered and Authorized by:   Roderick Pee MD   Signed by:   Roderick Pee MD on 09/17/2010   Method used:   Electronically to        [facility] [phone]* (retail)       [address]       [place], [place]  [address]       Ph: [phone]       Fax: [phone]   RxID:   [id number] VIAGRA 100 MG TABS (SILDENAFIL  CITRATE) UAD  #6 x 11   Entered and Authorized by:   Roderick Pee MD   Signed by:   Roderick Pee MD on 09/17/2010   Method used:   Electronically to        [facility] [phone]* (retail)       [address]       [place], [place]  [address]       Ph: [phone]       Fax: [phone]   RxID:   [id number] COREG 3.125 MG  TABS (CARVEDILOL) Take 1 tablet by mouth every morning  #100 x 3   Entered and Authorized by:   Roderick Pee MD   Signed by:   Roderick Pee MD on 09/17/2010   Method used:   Electronically to        [facility] [phone]* (retail)       [address]       [place], [place]  [address]       Ph: [phone]       Fax: [phone]   RxID:   [id number] ZESTRIL 40 MG  TABS (LISINOPRIL) Take 1 tablet by mouth two times a day  #200 x 3   Entered and Authorized by:   Roderick Pee MD   Signed by:   Roderick Pee MD on 09/17/2010   Method used:   Electronically to        [facility] [phone]* (retail)       [address]       [place], [place]  [address]       Ph: [phone]  Fax: [phone]   RxID:   [id number] CARDURA 8 MG  TABS (DOXAZOSIN MESYLATE) Take 1 tablet by mouth once a day  #100 x 3   Entered and Authorized by:   Roderick Pee MD   Signed by:   Roderick Pee MD on 09/17/2010   Method used:   Electronically to        [facility] [phone]* (retail)       [address]       [place], [place]  [address]       Ph: [phone]       Fax: [phone]   RxID:   [id number] SIMVASTATIN 80 MG  TABS (SIMVASTATIN) take 1/2 tab by mouth  every morning  #50 x 3   Entered and Authorized by:   Roderick Pee MD   Signed by:   Roderick Pee MD on 09/17/2010   Method used:   Electronically to        [facility] [phone]* (retail)       [address]       [place], [place]  [address]       Ph: [phone]       Fax: [phone]   RxID:   [id number] INDOMETHACIN CR 75 MG CPCR (INDOMETHACIN) Take 1 tablet by mouth every  morning  #100 x 3   Entered and Authorized by:   Roderick Pee MD   Signed by:   Roderick Pee MD on 09/17/2010   Method used:   Electronically to        [facility] [phone]* (retail)       [address]       [place], [place]  [address]       Ph: [phone]       Fax: [phone]   RxID:   [id number] FUROSEMIDE 80 MG TABS (FUROSEMIDE) Take 1 tablet by mouth two times a day  #200 x 3   Entered and Authorized by:   Roderick Pee MD   Signed by:   Roderick Pee MD on 09/17/2010   Method used:   Electronically to        [facility] [phone]* (retail)       [address]       [place], [place]  [address]       Ph: [phone]       Fax: [phone]   RxID:   [id number] ALLOPURINOL 300 MG TABS (ALLOPURINOL) 1 once daily  #100 x 3   Entered and Authorized by:   Roderick Pee MD   Signed by:   Roderick Pee MD on 09/17/2010   Method used:   Electronically to        [facility] [phone]* (retail)       [address]       [place], [place]  [address]       Ph: [phone]       Fax: [phone]   RxID:   [id number] CHANTIX CONTINUING MONTH PAK 1 MG TABS (VARENICLINE TARTRATE) UAD  #1 x 1   Entered and Authorized by:   Roderick Pee MD   Signed by:   Roderick Pee MD on 09/17/2010   Method used:   Electronically to        [facility] [phone]* (retail)       [address]  [place], [place]  [address]       Ph: [phone]       Fax: [phone]   RxID:   [id number] LOSARTAN POTASSIUM 100 MG TABS (LOSARTAN POTASSIUM) Take 1 tablet by mouth every morning  #100 x 3   Entered and Authorized by:   Roderick Pee MD   Signed by:   Roderick Pee MD on 09/17/2010   Method used:   Electronically to        [facility] [phone]* (retail)       [address]       [place], [place]  [address]       Ph: [phone]       Fax: [phone]   RxID:   [id number]    Orders Added: 1)  Prescription Created Electronically [G8553] 2)  Est. Patient  40-64 years [99396] 3)  Tobacco use cessation intensive >10 minutes [99407] 4)  EKG w/ Interpretation [93000] 5)  Spirometry w/Graph [60630]

## 2010-12-28 NOTE — Progress Notes (Signed)
Summary: order for cpap mask  Phone Note Call from Patient Call back at [phone]   Caller: Patient Call For: Chandel Zaun Reason for Call: Talk to Nurse Summary of Call: pt went to be fitted for a new mask, three weeks ago, hasn't heard anything back from [place].  Last thing they told him was they would send over order request for the mask.  Pt says he needs this new mask asap, hole in his. Initial call taken by: Eugene Gavia,  March 23, 2010 11:14 AM  Follow-up for Phone Call        pt did download a few weeks ago with a new masks and he states he had not heard anything from [facility] yet. i called [facility] and they state they have sent download results and once Dr. Vassie Loll reviews the results if he feels Arther Dames he can order new mask for the pt. The pt states his current mask has a hole in it and he cannot use it. Please advise. Carron Curie CMA  March 23, 2010 11:24 AM   Additional Follow-up for Phone Call Additional follow up Details #1::        [facility] Additional Follow-up by: Comer Locket. Vassie Loll MD,  March 23, 2010 9:49 PM

## 2010-12-28 NOTE — Miscellaneous (Signed)
Summary: Rec'd order CPAP/[facility]  Rec'd order CPAP/[facility]   Imported By: Lester Leominster 12/04/2009 10:17:37  _____________________________________________________________________  External Attachment:    Type:   Image     Comment:   External Document

## 2010-12-28 NOTE — Progress Notes (Signed)
Summary: c pap mask  Phone Note Call from Patient   Caller: Patient Call For: alva Summary of Call: pt have not heard anything about c pap mask Initial call taken by: Rickard Patience,  March 25, 2010 1:39 PM  Follow-up for Phone Call        Order just sent to [facility] for mask on 4/27.  Advised pt of this and he is very upset that it is taking so long.  Wants to know how long it normally takes to get mask or if he can just go pick up.  I am unsure of this so will forward to [facility] for advise. please advise, thanks Follow-up by: Vernie Murders,  March 25, 2010 2:04 PM  Additional Follow-up for Phone Call Additional follow up Details #1::        spoke to [email or web address]  she will have someone call pt today Additional Follow-up by: Oneita Jolly,  March 26, 2010 2:20 PM

## 2011-01-14 ENCOUNTER — Other Ambulatory Visit: Payer: Self-pay | Admitting: Family Medicine

## 2011-02-18 ENCOUNTER — Ambulatory Visit (INDEPENDENT_AMBULATORY_CARE_PROVIDER_SITE_OTHER): Payer: Managed Care, Other (non HMO) | Admitting: Endocrinology

## 2011-02-18 ENCOUNTER — Encounter: Payer: Self-pay | Admitting: Endocrinology

## 2011-02-18 ENCOUNTER — Other Ambulatory Visit (INDEPENDENT_AMBULATORY_CARE_PROVIDER_SITE_OTHER): Payer: Managed Care, Other (non HMO)

## 2011-02-18 DIAGNOSIS — E291 Testicular hypofunction: Secondary | ICD-10-CM

## 2011-02-18 DIAGNOSIS — E119 Type 2 diabetes mellitus without complications: Secondary | ICD-10-CM

## 2011-02-18 LAB — TESTOSTERONE: Testosterone: 384.45 ng/dL (ref 350.00–890.00)

## 2011-02-18 LAB — HEMOGLOBIN A1C: Hgb A1c MFr Bld: 6.8 % — ABNORMAL HIGH (ref 4.6–6.5)

## 2011-02-18 NOTE — Progress Notes (Signed)
  Subjective:    Patient ID: Bryan Haley, male    DOB: Apr 11, 1951, 60 y.o.   MRN: [id number]  HPI The state of 3 ongoing problems is addressed today. no cbg record, but states cbg's are well-controlled.  He seldom has hypoglycemia, and these episodes are mild.  He says this happens at no particular time of day, and is usually with a smaller-than-expected meal.  pt states he feels well in general. He reports fatigue, and he wants to know if he should increase the testim He says that the viagra helps very little. Past Medical History  Diagnosis Date  . Diabetic retinopathy of both eyes     Laser surgery, Dr. Luciana Axe  . DIABETES MELLITUS, TYPE I 09/02/2008  . DM W/COMPLICATION NOS, TYPE I 08/09/2007  . HYPOGONADISM 06/11/2010  . GOUT 07/11/2007  . HYPERTENSION 07/06/2007  . SLEEP APNEA 10/08/2009  . COLONIC POLYPS, HX OF 10/02/2008  . RENAL INSUFFICIENCY 12/11/2008   No past surgical history on file.  reports that he quit smoking about 2 years ago. He does not have any smokeless tobacco history on file. He reports that he drinks alcohol. He reports that he does not use illicit drugs. family history includes Diabetes in his other; Hyperlipidemia in his other; and Hypertension in his other.  There is no history of Cancer. No Known Allergies     Review of Systems Denies loc and decreased urinary stream    Objective:   Physical Exam Gen: obese, no distress Neck:  No thyromegaly is noted.    Lab Results  Component Value Date   HGBA1C 6.8* 02/18/2011  testosterone=384   Assessment & Plan:  Dm, needs increased rx Hypogonadism, well-controlled Ed, not completely explained by hypogonadism

## 2011-02-18 NOTE — Patient Instructions (Addendum)
blood tests are being ordered for you today.  please call [phone] to hear your test results. return 4 months check your blood sugar 2 times a day.  vary the time of day when you check, between before the 3 meals, and at bedtime.  also check if you have symptoms of your blood sugar being too high or too low.  please keep a record of the readings and bring it to your next appointment here.  please call us sooner if you are having low blood sugar episodes. pending the test results, please: continue humalog (just before each meal), 15-18-20 units, and: continue nph 30 units at bedtime.

## 2011-03-17 ENCOUNTER — Other Ambulatory Visit: Payer: Self-pay | Admitting: Endocrinology

## 2011-03-17 MED ORDER — TESTOSTERONE 50 MG/5GM (1%) TD GEL: 5.0000 g | Freq: Every day | TRANSDERMAL | Status: DC

## 2011-03-23 ENCOUNTER — Other Ambulatory Visit: Payer: Self-pay | Admitting: Endocrinology

## 2011-03-23 ENCOUNTER — Telehealth: Payer: Self-pay | Admitting: Endocrinology

## 2011-03-23 MED ORDER — INSULIN LISPRO 100 UNIT/ML ~~LOC~~ SOLN: SUBCUTANEOUS | Status: DC

## 2011-03-23 MED ORDER — INSULIN ASPART 100 UNIT/ML ~~LOC~~ SOLN: SUBCUTANEOUS | Status: DC

## 2011-03-23 NOTE — Telephone Encounter (Signed)
Per fax from [employer], insurance will not longer pay for [employer]. Rx changed to Novolog. Left message for pt to callback office to inform of change.

## 2011-03-24 NOTE — Telephone Encounter (Signed)
Pt informed

## 2011-04-15 NOTE — Consult Note (Signed)
[facility]                          ENDOCRINOLOGY CONSULTATION   NAME:PEEPLESJen Mow                      MRN:          [id number]  DATE:02/26/2007                            DOB:          September 22, 1951    REFERRING PHYSICIAN:  Tinnie Gens A. Tawanna Cooler, MD   REASON FOR REFERRAL:  Diabetes.   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 He has been on  insulin for the past 6 months. He takes Lantus 70 units a day as well as  2 oral agents. He states his glucoses continued to be elevated however.  He describes his diet and exercise as poor.  Symptomatically, he has  several months of slight DOE with associated pain in the chest.   PAST MEDICAL HISTORY:  1. Dyslipidemia.  2. Gout.  3. Hypertension.  4. NASH.   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   FAMILY HISTORY:  Positive for diabetes but not in his immediate family.   REVIEW OF SYSTEMS:  He has slight weight gain recently. Denies  hypoglycemia.   PHYSICAL EXAMINATION:  VITAL SIGNS:  Blood pressure 129/74, heart rate  83, temperature 98.9, weight 254.  GENERAL:  Obese, no distress.  SKIN:  No rash, not diaphoretic.  HEENT:  No proptosis, no periorbital swelling. Pharynx is normal.  NECK:  No goiter.  CHEST:  Clear to auscultation, no respiratory distress.  CARDIOVASCULAR:  Trace bilateral pretibial edema. Regular rate and  rhythm, no murmur, pedal pulses are intact and there is no bruit at the  carotid arteries.  EXTREMITIES:  Feet normal color and temperature. Dorsalis pedis pulses  are intact bilaterally.  NEUROLOGIC:  Alert and oriented. Does not appear anxious nor depressed.  Sensation is intact to touch on the feet.   LABORATORY DATA:  Forwarded by Dr. Tawanna Cooler:  On February 12, 2007, hemoglobin  A1c 8.0.   IMPRESSION:  1. 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  2. He has other risk factors of hypertension and diabetes.  3. 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   PLAN:  1. I have told him that because of the complexity of his situation, we      will need to take this in stages and he states this is okay.  2. I have advised him to increase the Lantus until his glucose      improves, and may discontinue his oral agents and then resume      raising the Lantus to renormalize his glucoses.  3. We discussed the importance of diet and exercise therapy and the      risk of diabetes.  4. Return in 3 weeks when it is likely I will make further changes in      his regimen.  5. He is advised to bring me a copy of his treadmill study that he had      done at his corporate  office in [place] a few weeks ago.     Sean A. Everardo All, MD  Electronically Signed    SAE/MedQ  DD: 02/27/2007  DT: 02/27/2007  Job #: [id number]   cc:   Tinnie Gens A. Tawanna Cooler, MD

## 2011-06-24 ENCOUNTER — Encounter: Payer: Self-pay | Admitting: Endocrinology

## 2011-06-24 ENCOUNTER — Other Ambulatory Visit (INDEPENDENT_AMBULATORY_CARE_PROVIDER_SITE_OTHER): Payer: Managed Care, Other (non HMO)

## 2011-06-24 ENCOUNTER — Ambulatory Visit (INDEPENDENT_AMBULATORY_CARE_PROVIDER_SITE_OTHER): Payer: Managed Care, Other (non HMO) | Admitting: Endocrinology

## 2011-06-24 VITALS — BP 122/66 | HR 58 | Temp 98.9°F | Ht 70.0 in | Wt 269.0 lb

## 2011-06-24 DIAGNOSIS — E119 Type 2 diabetes mellitus without complications: Secondary | ICD-10-CM

## 2011-06-24 NOTE — Patient Instructions (Addendum)
blood tests are being ordered for you today.  please call [phone] to hear your test results.  You will be prompted to enter the 9-digit "MRN" number that appears at the top left of this page, followed by #.  Then you will hear the message. return 4 months check your blood sugar 2 times a day.  vary the time of day when you check, between before the 3 meals, and at bedtime.  also check if you have symptoms of your blood sugar being too high or too low.  please keep a record of the readings and bring it to your next appointment here.  please call us sooner if you are having low blood sugar episodes. pending the test results, please: continue humalog (just before each meal), 15-18-20 units (subtract 10 if you are going to be active), and: continue nph 30 units at bedtime.

## 2011-06-24 NOTE — Progress Notes (Signed)
Subjective:    Patient ID: Bryan Haley, male    DOB: 1951-03-17, 60 y.o.   MRN: [id number]  HPI no cbg record, but states cbg was low only once since last ov, and this was mild.  He was working outside.  The highest time of day is usually prior to the evening meal.  pt states he feels well in general. Past Medical History  Diagnosis Date  . Diabetic retinopathy of both eyes     Laser surgery, Dr. Luciana Axe  . DIABETES MELLITUS, TYPE I 09/02/2008  . DM W/COMPLICATION NOS, TYPE I 08/09/2007  . HYPOGONADISM 06/11/2010  . GOUT 07/11/2007  . HYPERTENSION 07/06/2007  . SLEEP APNEA 10/08/2009  . COLONIC POLYPS, HX OF 10/02/2008  . RENAL INSUFFICIENCY 12/11/2008    No past surgical history on file.  History   Social History  . Marital Status: Married    Spouse Name: N/A    Number of Children: N/A  . Years of Education: N/A   Occupational History  . sales    Social History Main Topics  . Smoking status: Former Smoker    Quit date: 10/02/2008  . Smokeless tobacco: Not on file  . Alcohol Use: Yes     1 drink a week  . Drug Use: No  . Sexually Active: Not on file   Other Topics Concern  . Not on file   Social History Narrative   Regular exercise-noPt went to seminar to quit smoking    Current Outpatient Prescriptions on File Prior to Visit  Medication Sig Dispense Refill  . allopurinol (ZYLOPRIM) 300 MG tablet Take 300 mg by mouth daily.        Marland Kitchen amLODipine (NORVASC) 10 MG tablet Take 10 mg by mouth daily.        Marland Kitchen aspirin 325 MG tablet Take 325 mg by mouth daily.        . carvedilol (COREG) 3.125 MG tablet Take 1 tablet by mouth every morning       . doxazosin (CARDURA) 8 MG tablet Take 8 mg by mouth daily. 1 tablet by mouth once daily       . furosemide (LASIX) 80 MG tablet Take 80 mg by mouth 2 (two) times daily.        Marland Kitchen glucose blood (ONE TOUCH TEST STRIPS) test strip Use 1 strip two times a day dx 250.01       . indomethacin (INDOCIN SR) 75 MG CR capsule Take 1 tablet  by mouth every morning       . INS SYRINGE/NEEDLE 1CC/29G (B-D INSULIN SYRINGE) 29G X 1/2" 1 ML MISC Use as directed       . insulin aspart (NOVOLOG) 100 UNIT/ML injection 3x a day (just before each meal) 15-18-20 units  20 mL  11  . insulin NPH (HUMULIN N,NOVOLIN N) 100 UNIT/ML injection Inject 30 Units into the skin at bedtime.        . Insulin Pen Needle (PEN NEEDLES 3/16") 31G X 5 MM MISC Use as directed 3x daily       . lisinopril (PRINIVIL,ZESTRIL) 40 MG tablet Take 1 tablet by mouth two times a day       . losartan (COZAAR) 100 MG tablet Take 100 mg by mouth 2 (two) times daily.       . sildenafil (VIAGRA) 100 MG tablet Use as directed       . simvastatin (ZOCOR) 80 MG tablet Take 1/2 tablet by mouth every morning       .  testosterone (ANDROGEL) 50 MG/5GM GEL Place 5 g onto the skin daily.  30 Package  5    No Known Allergies  Family History  Problem Relation Age of Onset  . Cancer Neg Hx     No family history of colon cancer  . Diabetes Other   . Hyperlipidemia Other   . Hypertension Other     BP 122/66  Pulse 58  Temp(Src) 98.9 F (37.2 C) (Oral)  Ht 5\' 10"  (1.778 m)  Wt 269 lb (122.018 kg)  BMI 38.60 kg/m2  SpO2 96%  Review of Systems Denies loc    Objective:   Physical Exam Pulses: dorsalis pedis intact bilat.   Feet: no deformity.  no ulcer on the feet.  feet are of normal color and temp.  1+ bilat leg edema.  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

## 2011-08-19 ENCOUNTER — Other Ambulatory Visit: Payer: Self-pay | Admitting: Family Medicine

## 2011-08-25 ENCOUNTER — Encounter: Payer: Self-pay | Admitting: Pulmonary Disease

## 2011-08-25 ENCOUNTER — Ambulatory Visit (INDEPENDENT_AMBULATORY_CARE_PROVIDER_SITE_OTHER): Payer: Managed Care, Other (non HMO) | Admitting: Pulmonary Disease

## 2011-08-25 DIAGNOSIS — G4730 Sleep apnea, unspecified: Secondary | ICD-10-CM

## 2011-08-25 NOTE — Progress Notes (Signed)
  Subjective:    Patient ID: Bryan Haley, male    DOB: 1951/03/15, 60 y.o.   MRN: [id number]  HPI PCP - Tawanna Cooler  59/M ,smoker, hypertensive on 4 meds & diabetic for FU of severe obstructive sleep apnea.  On autoCPAP 10-20, lg FF mask  December 07, 2009 BiPAP 24/16 required during study, could not tolerate higher pressures during study.  download 1/3-1/18 >> avg pr 13.5, residual AHI 15, good compliance  reviewed download 2/1 - 02/04/10 on auto 10-20 >> avg pr 17, AHI 9/h (better), good compliance  4/11 desensitized with large resmed quattro FF mask  Nuvigil made him jittery - so stopped  08/25/2011 Quit smoking in 2011 with chantix Reports compliance, pressure ok, mask ok, no leak.  Wt same Mouth dry, no nasal congestion.    Review of Systems Patient denies significant dyspnea,cough, hemoptysis,  chest pain, palpitations, pedal edema, orthopnea, paroxysmal nocturnal dyspnea, lightheadedness, nausea, vomiting, abdominal or  leg pains      Objective:   Physical Exam Gen. Pleasant, obese, in no distress ENT - no lesions, no post nasal drip Neck: No JVD, no thyromegaly, no carotid bruits Lungs: no use of accessory muscles, no dullness to percussion, decreased without rales or rhonchi  Cardiovascular: Rhythm regular, heart sounds  normal, no murmurs or gallops, no peripheral edema Musculoskeletal: No deformities, no cyanosis or clubbing , no tremors        Assessment & Plan:

## 2011-08-25 NOTE — Patient Instructions (Addendum)
Renewed CPAP supplies.

## 2011-08-25 NOTE — Assessment & Plan Note (Signed)
Weight loss encouraged, compliance with goal of at least 4-6 hrs every night is the expectation. Advised against medications with sedative side effects Cautioned against driving when sleepy - understanding that sleepiness will vary on a day to day basis Ct autoCPAP 10-20 cm

## 2011-08-30 LAB — GLUCOSE, CAPILLARY: Glucose-Capillary: 113 — ABNORMAL HIGH

## 2011-09-13 ENCOUNTER — Other Ambulatory Visit (INDEPENDENT_AMBULATORY_CARE_PROVIDER_SITE_OTHER): Payer: Managed Care, Other (non HMO)

## 2011-09-13 DIAGNOSIS — Z0000 Encounter for general adult medical examination without abnormal findings: Secondary | ICD-10-CM

## 2011-09-13 LAB — HEPATIC FUNCTION PANEL
ALT: 31 U/L (ref 0–53)
Total Protein: 6.8 g/dL (ref 6.0–8.3)

## 2011-09-13 LAB — POCT URINALYSIS DIPSTICK
Bilirubin, UA: NEGATIVE
Blood, UA: NEGATIVE
Ketones, UA: NEGATIVE
Spec Grav, UA: 1.01
pH, UA: 5

## 2011-09-13 LAB — LIPID PANEL
Cholesterol: 141 mg/dL (ref 0–200)
HDL: 33.3 mg/dL — ABNORMAL LOW (ref >39.00)
Triglycerides: 466 mg/dL — ABNORMAL HIGH (ref 0.0–149.0)

## 2011-09-13 LAB — CBC WITH DIFFERENTIAL/PLATELET
Basophils Relative: 0.3 % (ref 0.0–3.0)
Eosinophils Absolute: 0 10*3/uL (ref 0.0–0.7)
Eosinophils Relative: 0 % (ref 0.0–5.0)
HCT: 41.3 % (ref 39.0–52.0)
Lymphs Abs: 1.2 10*3/uL (ref 0.7–4.0)
MCHC: 33.6 g/dL (ref 30.0–36.0)
MCV: 97 fl (ref 78.0–100.0)
Monocytes Absolute: 0.5 10*3/uL (ref 0.1–1.0)
Platelets: 129 10*3/uL — ABNORMAL LOW (ref 150.0–400.0)
RBC: 4.26 Mil/uL (ref 4.22–5.81)
WBC: 7.1 10*3/uL (ref 4.5–10.5)

## 2011-09-13 LAB — TSH: TSH: 2.46 u[IU]/mL (ref 0.35–5.50)

## 2011-09-13 LAB — BASIC METABOLIC PANEL
BUN: 29 mg/dL — ABNORMAL HIGH (ref 6–23)
CO2: 28 mEq/L (ref 19–32)
Chloride: 106 mEq/L (ref 96–112)
Creatinine, Ser: 1.5 mg/dL (ref 0.4–1.5)
Potassium: 4.2 mEq/L (ref 3.5–5.1)

## 2011-09-13 LAB — PSA: PSA: 1.79 ng/mL (ref 0.10–4.00)

## 2011-09-13 LAB — MICROALBUMIN / CREATININE URINE RATIO
Creatinine,U: 43.9 mg/dL
Microalb Creat Ratio: 68.4 mg/g — ABNORMAL HIGH (ref 0.0–30.0)

## 2011-09-20 ENCOUNTER — Ambulatory Visit (INDEPENDENT_AMBULATORY_CARE_PROVIDER_SITE_OTHER): Payer: Managed Care, Other (non HMO) | Admitting: Family Medicine

## 2011-09-20 ENCOUNTER — Encounter: Payer: Self-pay | Admitting: Family Medicine

## 2011-09-20 DIAGNOSIS — E1065 Type 1 diabetes mellitus with hyperglycemia: Secondary | ICD-10-CM

## 2011-09-20 DIAGNOSIS — E785 Hyperlipidemia, unspecified: Secondary | ICD-10-CM

## 2011-09-20 DIAGNOSIS — M109 Gout, unspecified: Secondary | ICD-10-CM

## 2011-09-20 DIAGNOSIS — R809 Proteinuria, unspecified: Secondary | ICD-10-CM

## 2011-09-20 DIAGNOSIS — I10 Essential (primary) hypertension: Secondary | ICD-10-CM

## 2011-09-20 DIAGNOSIS — Z23 Encounter for immunization: Secondary | ICD-10-CM

## 2011-09-20 MED ORDER — DOXAZOSIN MESYLATE 8 MG PO TABS: 8.0000 mg | ORAL_TABLET | Freq: Every day | ORAL | Status: DC

## 2011-09-20 MED ORDER — ALLOPURINOL 300 MG PO TABS: 300.0000 mg | ORAL_TABLET | Freq: Every day | ORAL | Status: DC

## 2011-09-20 MED ORDER — LISINOPRIL 40 MG PO TABS: 40.0000 mg | ORAL_TABLET | Freq: Two times a day (BID) | ORAL | Status: DC

## 2011-09-20 MED ORDER — FUROSEMIDE 80 MG PO TABS: 80.0000 mg | ORAL_TABLET | Freq: Two times a day (BID) | ORAL | Status: DC

## 2011-09-20 MED ORDER — SIMVASTATIN 40 MG PO TABS: 40.0000 mg | ORAL_TABLET | Freq: Every evening | ORAL | Status: DC

## 2011-09-20 MED ORDER — LOSARTAN POTASSIUM 100 MG PO TABS: 100.0000 mg | ORAL_TABLET | Freq: Two times a day (BID) | ORAL | Status: DC

## 2011-09-20 MED ORDER — INDOMETHACIN ER 75 MG PO CPCR: 75.0000 mg | ORAL_CAPSULE | Freq: Two times a day (BID) | ORAL | Status: DC

## 2011-09-20 MED ORDER — CARVEDILOL 3.125 MG PO TABS: 3.1250 mg | ORAL_TABLET | ORAL | Status: DC

## 2011-09-20 MED ORDER — AMLODIPINE BESYLATE 10 MG PO TABS: 10.0000 mg | ORAL_TABLET | Freq: Every day | ORAL | Status: DC

## 2011-09-20 NOTE — Progress Notes (Signed)
Subjective:    Patient ID: Bryan Haley, male    DOB: 1950/12/07, 60 y.o.   MRN: [id number]  HPI Bryan Haley is a 60 year old, married male,,,,,, ex smoker x 1 year,,,,,, who comes in today for evaluation of hypertension, diabetes, gallops, hyperlipidemia, erectile dysfunction.  He quit smoking a year ago, and I congratulated him.  His blood sugar at home.  He says is running around 100 however, we got 238.  He states his last A1c with Bryan Haley was normal which a year.  Ago, was 6.4%.  Seven months ago, was 6.8, two months ago, was 7.0, and now at 7.4.  He sees Bryan Haley on a regular basis and he also sees Bryan Haley. D. His nephrologist.  His GFR is 48 with a creatinine of 1.5 and a BUN of 29.  He also takes allopurinol 300 mg daily.  Blood pressure medications were reviewed.  There been no changes.  His insulin program with Bryan Haley is 30 units of NPH at bedtime, and a sliding scale before each meal.  We talked about increasing exercises decreasing diet or increasing insulin.  He uses fiber 100 mg p.r.n. For ED.  He is also on a testosterone supplement, and Bryan Haley.  He takes Zocor 40 mg nightly for hyperlipidemia, HDL 33, triglycerides 466, LDL is 43.  He gets routine eye care.......... History of retinopathy......[facility].  Colonoscopy in GI, tetanus, 2003, Pneumovax 2007, seasonal flu shot today, information given on shingles   Review of Systems  Constitutional: Negative.   HENT: Negative.   Eyes: Negative.   Respiratory: Negative.   Cardiovascular: Negative.   Gastrointestinal: Negative.   Genitourinary: Negative.   Musculoskeletal: Negative.   Skin: Negative.   Neurological: Negative.   Hematological: Negative.   Psychiatric/Behavioral: Negative.        Objective:   Physical Exam  Constitutional: He is oriented to person, place, and time. He appears well-developed and well-nourished.  HENT:  Head: Normocephalic and atraumatic.  Right Ear: External ear  normal.  Left Ear: External ear normal.  Nose: Nose normal.  Mouth/Throat: Oropharynx is clear and moist.  Eyes: Conjunctivae and EOM are normal. Pupils are equal, round, and reactive to light.  Neck: Normal range of motion. Neck supple. No JVD present. No tracheal deviation present. No thyromegaly present.  Cardiovascular: Normal rate, regular rhythm, normal heart sounds and intact distal pulses.  Exam reveals no gallop and no friction rub.   No murmur heard. Pulmonary/Chest: Effort normal and breath sounds normal. No stridor. No respiratory distress. He has no wheezes. He has no rales. He exhibits no tenderness.  Abdominal: Soft. Bowel sounds are normal. He exhibits no distension and no mass. There is no tenderness. There is no rebound and no guarding.  Genitourinary: Rectum normal, prostate normal and penis normal. Guaiac negative stool. No penile tenderness.  Musculoskeletal: Normal range of motion. He exhibits no edema and no tenderness.  Lymphadenopathy:    He has no cervical adenopathy.  Neurological: He is alert and oriented to person, place, and time. He has normal reflexes. No cranial nerve deficit. He exhibits normal muscle tone.  Skin: Skin is warm and dry. No rash noted. No erythema. No pallor.  Psychiatric: He has a normal mood and affect. His behavior is normal. Judgment and thought content normal.          Assessment & Plan:  Diabetes type I, not at goal.  Plan discussed modalities to reach goal.  Follow-up with Bryan Haley  in two weeks.  Hypertension.  Continue current medications and follow-up with Bryan Haley. D. His nephrologist.  Osteoarthritis.  Continue indomethacin 75 mg b.i.d.  History of gout.  Continue allopurinol 300 mg daily.  History of erectile dysfunction.  Continue Vicryl in her milligrams p.r.n. And Intergel via Bryan Haley.  History of hyperlipidemia.  Continue simvastatin 40 mg daily.  His vaccinations were updated as to come see [place] on annual basis  for general physical and follow-up during year with his endocrinologist and nephrologist

## 2011-09-20 NOTE — Patient Instructions (Signed)
Continue your basic medications.  Tightened up on your diabetes.  Follow-up in a couple weeks with Dr. Revonda Standard.  Return to see [place] in yearly for general physical.  Also, follow-up with your nephrologist, Dr. Raquel James

## 2011-09-27 ENCOUNTER — Telehealth: Payer: Self-pay | Admitting: Family Medicine

## 2011-09-27 NOTE — Telephone Encounter (Signed)
Pt is sch for shingle vaccine 10-31 at 140pm

## 2011-09-27 NOTE — Telephone Encounter (Signed)
Pt called back about Zostavax. States he spoke with insurance, & they will pay the whole thing. Call him on his mobile if you need to get in touch. Thanks!

## 2011-09-28 ENCOUNTER — Ambulatory Visit (INDEPENDENT_AMBULATORY_CARE_PROVIDER_SITE_OTHER): Payer: Managed Care, Other (non HMO) | Admitting: Family Medicine

## 2011-09-28 DIAGNOSIS — Z23 Encounter for immunization: Secondary | ICD-10-CM

## 2011-10-10 ENCOUNTER — Other Ambulatory Visit: Payer: Self-pay | Admitting: *Deleted

## 2011-10-10 MED ORDER — TESTOSTERONE 50 MG/5GM (1%) TD GEL: 5.0000 g | Freq: Every day | TRANSDERMAL | Status: DC

## 2011-10-10 NOTE — Telephone Encounter (Signed)
R'cd fax from [facility] for refill of Androgel-please advise  Last OV-05/2011  Last filled-09/04/2011

## 2011-10-10 NOTE — Telephone Encounter (Signed)
Rx faxed to [facility].  

## 2011-10-11 ENCOUNTER — Other Ambulatory Visit: Payer: Self-pay | Admitting: Family Medicine

## 2011-10-22 ENCOUNTER — Other Ambulatory Visit: Payer: Self-pay | Admitting: Endocrinology

## 2011-10-25 ENCOUNTER — Encounter: Payer: Self-pay | Admitting: Endocrinology

## 2011-10-25 ENCOUNTER — Ambulatory Visit (INDEPENDENT_AMBULATORY_CARE_PROVIDER_SITE_OTHER): Payer: Managed Care, Other (non HMO) | Admitting: Endocrinology

## 2011-10-25 DIAGNOSIS — E1065 Type 1 diabetes mellitus with hyperglycemia: Secondary | ICD-10-CM

## 2011-10-25 NOTE — Progress Notes (Signed)
  Subjective:    Patient ID: Bryan Haley, male    DOB: [birth year], 60 y.o.   MRN: [id number]  HPI Pt returns for f/u of insulin-requiring DM (2001).  pt states he feels well in general.  He says his diet and exercise have not been good recently.  He has increased humalog due to hyperglycemia.     Review of Systems denies hypoglycemia    Objective:   Physical Exam VITAL SIGNS:  See vs page GENERAL: no distress SKIN:  Insulin injection sites at the anterior abdomen are normal  Lab Results  Component Value Date   HGBA1C 7.4* 09/13/2011      Assessment & Plan:  DM: he nneeds increased rx

## 2011-10-25 NOTE — Patient Instructions (Addendum)
return 4 months check your blood sugar 2 times a day.  vary the time of day when you check, between before the 3 meals, and at bedtime.  also check if you have symptoms of your blood sugar being too high or too low.  please keep a record of the readings and bring it to your next appointment here.  please call us sooner if you are having low blood sugar episodes.  pending the test results, please: continue novolog (just before each meal), 20-25-28 units (subtract 10 if you are going to be active), and: increase nph to 35 units at bedtime.   Please let me know what your insurance wants to change androgel to.

## 2011-12-01 ENCOUNTER — Other Ambulatory Visit: Payer: Self-pay | Admitting: Family Medicine

## 2011-12-02 ENCOUNTER — Other Ambulatory Visit: Payer: Self-pay | Admitting: Family Medicine

## 2011-12-05 ENCOUNTER — Other Ambulatory Visit: Payer: Self-pay

## 2011-12-05 MED ORDER — INSULIN ASPART 100 UNIT/ML ~~LOC~~ SOLN: SUBCUTANEOUS | Status: DC

## 2011-12-10 ENCOUNTER — Other Ambulatory Visit: Payer: Self-pay | Admitting: Family Medicine

## 2011-12-27 ENCOUNTER — Other Ambulatory Visit: Payer: Self-pay | Admitting: *Deleted

## 2011-12-27 DIAGNOSIS — Z23 Encounter for immunization: Secondary | ICD-10-CM

## 2011-12-27 DIAGNOSIS — E1065 Type 1 diabetes mellitus with hyperglycemia: Secondary | ICD-10-CM

## 2011-12-27 DIAGNOSIS — R809 Proteinuria, unspecified: Secondary | ICD-10-CM

## 2011-12-27 DIAGNOSIS — M109 Gout, unspecified: Secondary | ICD-10-CM

## 2011-12-27 DIAGNOSIS — I10 Essential (primary) hypertension: Secondary | ICD-10-CM

## 2011-12-27 DIAGNOSIS — E785 Hyperlipidemia, unspecified: Secondary | ICD-10-CM

## 2011-12-27 MED ORDER — INDOMETHACIN ER 75 MG PO CPCR: 75.0000 mg | ORAL_CAPSULE | Freq: Two times a day (BID) | ORAL | Status: DC

## 2012-02-10 ENCOUNTER — Telehealth: Payer: Self-pay

## 2012-02-10 MED ORDER — TESTOSTERONE 10 MG/ACT (2%) TD GEL: 4.0000 | Freq: Every day | TRANSDERMAL | Status: DC

## 2012-02-10 NOTE — Telephone Encounter (Signed)
[facility] [address]  

## 2012-02-10 NOTE — Telephone Encounter (Signed)
i printed 

## 2012-02-10 NOTE — Telephone Encounter (Signed)
Pt called stating his Insurance company will no longer cover testosterone. Covered alternative is [place].

## 2012-02-21 ENCOUNTER — Other Ambulatory Visit (INDEPENDENT_AMBULATORY_CARE_PROVIDER_SITE_OTHER): Payer: Managed Care, Other (non HMO)

## 2012-02-21 ENCOUNTER — Encounter: Payer: Self-pay | Admitting: Endocrinology

## 2012-02-21 ENCOUNTER — Ambulatory Visit (INDEPENDENT_AMBULATORY_CARE_PROVIDER_SITE_OTHER): Payer: Managed Care, Other (non HMO) | Admitting: Endocrinology

## 2012-02-21 VITALS — BP 120/82 | HR 61 | Temp 98.7°F | Ht 70.0 in | Wt 274.4 lb

## 2012-02-21 DIAGNOSIS — E1065 Type 1 diabetes mellitus with hyperglycemia: Secondary | ICD-10-CM

## 2012-02-21 DIAGNOSIS — E291 Testicular hypofunction: Secondary | ICD-10-CM

## 2012-02-21 LAB — HEMOGLOBIN A1C: Hgb A1c MFr Bld: 7.7 % — ABNORMAL HIGH (ref 4.6–6.5)

## 2012-02-21 LAB — TESTOSTERONE: Testosterone: 329.67 ng/dL — ABNORMAL LOW (ref 350.00–890.00)

## 2012-02-21 MED ORDER — INSULIN ASPART 100 UNIT/ML ~~LOC~~ SOLN: SUBCUTANEOUS | Status: DC

## 2012-02-21 MED ORDER — INSULIN NPH (HUMAN) (ISOPHANE) 100 UNIT/ML ~~LOC~~ SUSP: 40.0000 [IU] | Freq: Every day | SUBCUTANEOUS | Status: DC

## 2012-02-21 NOTE — Progress Notes (Signed)
Subjective:    Patient ID: Bryan Haley, male    DOB: 22-Sep-1951, 61 y.o.   MRN: [id number]  HPI Pt returns for f/u of insulin-requiring DM (2001). no cbg record, but states cbg's are persistently in the 200's.  There is no trend throughout the day. He has been on "fortesta" x 1 week.   Past Medical History  Diagnosis Date  . Diabetic retinopathy of both eyes     Laser surgery, Dr. Luciana Axe  . DIABETES MELLITUS, TYPE I 09/02/2008  . DM W/COMPLICATION NOS, TYPE I 08/09/2007  . HYPOGONADISM 06/11/2010  . GOUT 07/11/2007  . HYPERTENSION 07/06/2007  . SLEEP APNEA 10/08/2009  . COLONIC POLYPS, HX OF 10/02/2008  . RENAL INSUFFICIENCY 12/11/2008    No past surgical history on file.  History   Social History  . Marital Status: Married    Spouse Name: N/A    Number of Children: N/A  . Years of Education: N/A   Occupational History  . sales    Social History Main Topics  . Smoking status: Former Smoker    Quit date: 10/02/2010  . Smokeless tobacco: Never Used  . Alcohol Use: Yes     1 drink a week  . Drug Use: No  . Sexually Active: Not on file   Other Topics Concern  . Not on file   Social History Narrative   Regular exercise-noPt went to seminar to quit smoking    Current Outpatient Prescriptions on File Prior to Visit  Medication Sig Dispense Refill  . allopurinol (ZYLOPRIM) 300 MG tablet TAKE 1 TABLET EVERY DAY  100 tablet  0  . amLODipine (NORVASC) 10 MG tablet TAKE 1 TABLET EVERY DAY  100 tablet  1  . aspirin 325 MG tablet Take 325 mg by mouth daily.        . carvedilol (COREG) 3.125 MG tablet TAKE 1 TABLET BY MOUTH EVERY MORNING  100 tablet  1  . doxazosin (CARDURA) 8 MG tablet Take 1 tablet (8 mg total) by mouth daily. 1 tablet by mouth once daily  100 tablet  3  . furosemide (LASIX) 80 MG tablet TAKE 1 TABLET TWICE A DAY  200 tablet  1  . glucose blood (ONE TOUCH TEST STRIPS) test strip Use 1 strip two times a day dx 250.01       . indomethacin (INDOCIN SR) 75 MG  CR capsule Take 1 capsule (75 mg total) by mouth 2 (two) times daily with a meal.  200 capsule  3  . INS SYRINGE/NEEDLE 1CC/29G (B-D INSULIN SYRINGE) 29G X 1/2" 1 ML MISC Use as directed       . lisinopril (PRINIVIL,ZESTRIL) 40 MG tablet Take 1 tablet (40 mg total) by mouth 2 (two) times daily. Take 1 tablet by mouth two times a day  200 tablet  3  . losartan (COZAAR) 100 MG tablet Take 1 tablet (100 mg total) by mouth 2 (two) times daily.  200 tablet  3  . sildenafil (VIAGRA) 100 MG tablet Use as directed       . simvastatin (ZOCOR) 40 MG tablet Take 1 tablet (40 mg total) by mouth every evening.  100 tablet  3  . Testosterone (FORTESTA) 10 MG/ACT (2%) GEL Place 4 Squirts onto the skin daily.  60 g  1  . insulin aspart (NOVOLOG FLEXPEN) 100 UNIT/ML injection 3x a day (just before each meal) 30-35-40 units, and pen needles 4/day  120 mL  3  . insulin NPH (HUMULIN  N,NOVOLIN N) 100 UNIT/ML injection Inject 40 Units into the skin at bedtime.  45 mL  3  . Insulin Pen Needle (PEN NEEDLES 3/16") 31G X 5 MM MISC Use as directed 3x daily         No Known Allergies  Family History  Problem Relation Age of Onset  . Cancer Neg Hx     No family history of colon cancer  . Diabetes Other   . Hyperlipidemia Other   . Hypertension Other     BP 120/82  Pulse 61  Temp(Src) 98.7 F (37.1 C) (Oral)  Ht 5\' 10"  (1.778 m)  Wt 274 lb 6 oz (124.456 kg)  BMI 39.37 kg/m2  SpO2 97%   Review of Systems denies hypoglycemia    Objective:   Physical Exam VITAL SIGNS:  See vs page GENERAL: no distress Pulses: dorsalis pedis intact bilat.   Feet: no deformity.  no ulcer on the feet.  feet are of normal color and temp.  no edema Neuro: sensation is intact to touch on the feet   Lab Results  Component Value Date   HGBA1C 7.7* 02/21/2012   Lab Results  Component Value Date   TESTOSTERONE 329.67* 02/21/2012      Assessment & Plan:  DM, needs increased rx Hypogonadism, needs increased rx.  However, he  has not been on fortesta very long

## 2012-02-21 NOTE — Patient Instructions (Addendum)
return 4 months check your blood sugar 2 times a day.  vary the time of day when you check, between before the 3 meals, and at bedtime.  also check if you have symptoms of your blood sugar being too high or too low.  please keep a record of the readings and bring it to your next appointment here.  please call us sooner if you are having low blood sugar episodes.  blood tests are being requested for you today.  You will receive a letter with results.  pending the test results, please:   increase novolog to (just before each meal), 25-30-35 units (subtract 10 if you are going to be active), and:  continue nph to 40 units at bedtime.   (see letter)

## 2012-02-22 ENCOUNTER — Telehealth: Payer: Self-pay | Admitting: *Deleted

## 2012-02-22 NOTE — Telephone Encounter (Signed)
Pt advised of results. 

## 2012-02-22 NOTE — Telephone Encounter (Signed)
Called pt to inform of lab results. Left message for pt to callback office (Letter also mailed to pt).

## 2012-03-17 ENCOUNTER — Other Ambulatory Visit: Payer: Self-pay | Admitting: Endocrinology

## 2012-04-19 ENCOUNTER — Other Ambulatory Visit: Payer: Self-pay | Admitting: Family Medicine

## 2012-04-25 ENCOUNTER — Other Ambulatory Visit: Payer: Self-pay | Admitting: *Deleted

## 2012-04-25 NOTE — Telephone Encounter (Signed)
R'cd fax from [facility] for refill of Fortesta-last written 02/10/2012 quantity 60g with 1 refill-please advise.

## 2012-04-26 MED ORDER — TESTOSTERONE 10 MG/ACT (2%) TD GEL: 4.0000 | Freq: Every day | TRANSDERMAL | Status: DC

## 2012-04-26 NOTE — Telephone Encounter (Signed)
i printed 

## 2012-04-26 NOTE — Telephone Encounter (Signed)
Rx faxed to [facility] on [address]

## 2012-05-21 ENCOUNTER — Other Ambulatory Visit: Payer: Self-pay | Admitting: Family Medicine

## 2012-06-20 ENCOUNTER — Encounter: Payer: Self-pay | Admitting: Endocrinology

## 2012-06-20 ENCOUNTER — Ambulatory Visit (INDEPENDENT_AMBULATORY_CARE_PROVIDER_SITE_OTHER): Payer: Managed Care, Other (non HMO) | Admitting: Endocrinology

## 2012-06-20 ENCOUNTER — Other Ambulatory Visit (INDEPENDENT_AMBULATORY_CARE_PROVIDER_SITE_OTHER): Payer: Managed Care, Other (non HMO)

## 2012-06-20 VITALS — BP 112/60 | HR 64 | Temp 97.6°F | Ht 70.0 in | Wt 268.0 lb

## 2012-06-20 DIAGNOSIS — E291 Testicular hypofunction: Secondary | ICD-10-CM

## 2012-06-20 DIAGNOSIS — E1065 Type 1 diabetes mellitus with hyperglycemia: Secondary | ICD-10-CM

## 2012-06-20 LAB — HEMOGLOBIN A1C: Hgb A1c MFr Bld: 7.1 % — ABNORMAL HIGH (ref 4.6–6.5)

## 2012-06-20 NOTE — Progress Notes (Signed)
Subjective:    Patient ID: Bryan Haley, male    DOB: 26-Apr-1951, 61 y.o.   MRN: [id number]  HPI Pt returns for f/u of insulin-requiring DM (dx'ed 2001; complicated by renal insufficiency). no cbg record.  He has had only 1 episode of hypoglycemia, and this was mild. It was after a smaller-than-usual breakfast.  It is usually highest in the afternoon.   He has been on "fortesta" x 1 week.   Past Medical History  Diagnosis Date  . Diabetic retinopathy of both eyes     Laser surgery, Dr. Luciana Axe  . DIABETES MELLITUS, TYPE I 09/02/2008  . DM W/COMPLICATION NOS, TYPE I 08/09/2007  . HYPOGONADISM 06/11/2010  . GOUT 07/11/2007  . HYPERTENSION 07/06/2007  . SLEEP APNEA 10/08/2009  . COLONIC POLYPS, HX OF 10/02/2008  . RENAL INSUFFICIENCY 12/11/2008    No past surgical history on file.  History   Social History  . Marital Status: Married    Spouse Name: N/A    Number of Children: N/A  . Years of Education: N/A   Occupational History  . sales    Social History Main Topics  . Smoking status: Former Smoker    Quit date: 10/02/2010  . Smokeless tobacco: Never Used  . Alcohol Use: Yes     1 drink a week  . Drug Use: No  . Sexually Active: Not on file   Other Topics Concern  . Not on file   Social History Narrative   Regular exercise-noPt went to seminar to quit smoking    Current Outpatient Prescriptions on File Prior to Visit  Medication Sig Dispense Refill  . allopurinol (ZYLOPRIM) 300 MG tablet TAKE 1 TABLET EVERY DAY  100 tablet  0  . amLODipine (NORVASC) 10 MG tablet TAKE 1 TABLET EVERY DAY  100 tablet  1  . aspirin 325 MG tablet Take 325 mg by mouth daily.        . carvedilol (COREG) 3.125 MG tablet TAKE 1 TABLET BY MOUTH EVERY MORNING  100 tablet  1  . doxazosin (CARDURA) 8 MG tablet Take 1 tablet (8 mg total) by mouth daily. 1 tablet by mouth once daily  100 tablet  3  . furosemide (LASIX) 80 MG tablet TAKE 1 TABLET TWICE A DAY  200 tablet  1  . indomethacin (INDOCIN  SR) 75 MG CR capsule Take 1 capsule (75 mg total) by mouth 2 (two) times daily with a meal.  200 capsule  3  . INS SYRINGE/NEEDLE 1CC/29G (B-D INSULIN SYRINGE) 29G X 1/2" 1 ML MISC Use as directed       . insulin aspart (NOVOLOG) 100 UNIT/ML injection 3x a day (just before each meal) 25-30-35 units      . insulin NPH (HUMULIN N,NOVOLIN N) 100 UNIT/ML injection Inject 40 Units into the skin at bedtime.  45 mL  3  . Insulin Pen Needle (PEN NEEDLES 3/16") 31G X 5 MM MISC Use as directed 3x daily       . lisinopril (PRINIVIL,ZESTRIL) 40 MG tablet Take 1 tablet (40 mg total) by mouth 2 (two) times daily. Take 1 tablet by mouth two times a day  200 tablet  3  . losartan (COZAAR) 100 MG tablet Take 1 tablet (100 mg total) by mouth 2 (two) times daily.  200 tablet  3  . ONE TOUCH ULTRA TEST test strip TEST TWICE A DAY  200 each  3  . sildenafil (VIAGRA) 100 MG tablet Use as directed       .  simvastatin (ZOCOR) 40 MG tablet Take 1 tablet (40 mg total) by mouth every evening.  100 tablet  3  . Testosterone (FORTESTA) 10 MG/ACT (2%) GEL Place 4 Squirts onto the skin daily.  60 g  1    No Known Allergies  Family History  Problem Relation Age of Onset  . Cancer Neg Hx     No family history of colon cancer  . Diabetes Other   . Hyperlipidemia Other   . Hypertension Other     BP 112/60  Pulse 64  Temp 97.6 F (36.4 C) (Oral)  Ht 5\' 10"  (1.778 m)  Wt 268 lb (121.564 kg)  BMI 38.45 kg/m2  SpO2 96%  Review of Systems Denies LOC    Objective:   Physical Exam VITAL SIGNS:  See vs page GENERAL: no distress SKIN:  Insulin injection sites at the anterior abdomen are normal.  Lab Results  Component Value Date   HGBA1C 7.1* 06/20/2012   Lab Results  Component Value Date   TESTOSTERONE 457.79 06/20/2012      Assessment & Plan:  Hypogonadism is well-replaced DM.  needs increased rx

## 2012-06-20 NOTE — Patient Instructions (Addendum)
return 4 months check your blood sugar 2 times a day.  vary the time of day when you check, between before the 3 meals, and at bedtime.  also check if you have symptoms of your blood sugar being too high or too low.  please keep a record of the readings and bring it to your next appointment here.  please call us sooner if you are having low blood sugar episodes.  blood tests are being requested for you today.  You will receive a letter with results.  pending the test results, please continue the same medications.

## 2012-06-22 ENCOUNTER — Telehealth: Payer: Self-pay | Admitting: *Deleted

## 2012-06-22 NOTE — Telephone Encounter (Signed)
Called pt to inform of lab results, pt informed (letter also mailed to pt). 

## 2012-07-02 ENCOUNTER — Other Ambulatory Visit: Payer: Self-pay | Admitting: *Deleted

## 2012-07-02 MED ORDER — TESTOSTERONE 10 MG/ACT (2%) TD GEL: 4.0000 | Freq: Every day | TRANSDERMAL | Status: DC

## 2012-07-02 NOTE — Telephone Encounter (Signed)
i printed 

## 2012-07-02 NOTE — Telephone Encounter (Signed)
Rx faxed to [facility].  

## 2012-07-02 NOTE — Telephone Encounter (Signed)
R'c fax from [facility] for refill of Fortesta-last written 04/26/2012 #60g with 1 refill-please advise.

## 2012-07-10 ENCOUNTER — Other Ambulatory Visit: Payer: Self-pay

## 2012-07-10 MED ORDER — INSULIN NPH (HUMAN) (ISOPHANE) 100 UNIT/ML ~~LOC~~ SUSP: 40.0000 [IU] | Freq: Every day | SUBCUTANEOUS | Status: DC

## 2012-07-18 ENCOUNTER — Other Ambulatory Visit: Payer: Self-pay | Admitting: Family Medicine

## 2012-07-23 ENCOUNTER — Telehealth: Payer: Self-pay | Admitting: Family Medicine

## 2012-07-23 NOTE — Telephone Encounter (Signed)
Patient had shingles vaccine 09/28/11.  Patient is aware.

## 2012-07-23 NOTE — Telephone Encounter (Signed)
Caller: [name] [phone]/Patient; Phone: [phone]; Reason for Call: Pt needs to know if he has had a Shingles vaccination, and wants to know if he has not if he can have one since he is a diabetic

## 2012-08-10 ENCOUNTER — Other Ambulatory Visit: Payer: Self-pay | Admitting: Family Medicine

## 2012-08-21 ENCOUNTER — Telehealth: Payer: Self-pay | Admitting: Family Medicine

## 2012-08-21 NOTE — Telephone Encounter (Signed)
Caller: [name]/Patient; Patient Name: Bryan Haley; PCP: Kelle Darting [facility]); Best Callback Phone Number: [phone] Calling about starting with congestion, pressure in head and feeling tired- onset 08/13/12. Afebrile. Occasional productive cough with dark greenish sputum for past week. He tried Alkaseltzer cold and Sinus medication and helped some with pressure in head. Hasn't taken it since yesterday. Advised to talk with MD about taking any multi-symptom Cold med due to hx HTN. Triage and Care advice per Upper Respiratory Infection Protocol and appointment advised within 24 hours for Productive cough wih colored sputum. Appointment scheduled for 08/22/12 at 0915 with Dr. Tawanna Cooler.

## 2012-08-22 ENCOUNTER — Ambulatory Visit (INDEPENDENT_AMBULATORY_CARE_PROVIDER_SITE_OTHER): Payer: Managed Care, Other (non HMO) | Admitting: Family Medicine

## 2012-08-22 ENCOUNTER — Encounter: Payer: Self-pay | Admitting: Family Medicine

## 2012-08-22 VITALS — BP 120/80 | Temp 98.2°F | Wt 264.0 lb

## 2012-08-22 DIAGNOSIS — J309 Allergic rhinitis, unspecified: Secondary | ICD-10-CM

## 2012-08-22 DIAGNOSIS — Z23 Encounter for immunization: Secondary | ICD-10-CM

## 2012-08-22 MED ORDER — FLUTICASONE PROPIONATE 50 MCG/ACT NA SUSP: NASAL | Status: DC

## 2012-08-22 NOTE — Patient Instructions (Signed)
Zyrtec plain one tablet daily at bedtime  Steroid nasal spray one shot up each nostril at bedtime  Afrin nasal spray,,,,,,,,,,, one shot up each nostril at bedtime for 5 nights then stop

## 2012-08-22 NOTE — Progress Notes (Signed)
  Subjective:    Patient ID: Bryan Haley, male    DOB: 12/02/1950, 61 y.o.   MRN: [id number]  HPI Bryan Haley is a 61 year old male type I diabetic who comes in today for evaluation of allergic symptoms for 2 weeks and. She's had head congestion postnasal drip and cough. He always has trouble in the fall.   Review of Systems General and ENT and pulmonary review of systems otherwise negative    Objective:   Physical Exam  Well-developed well-nourished male no acute distress HEENT negative except for 3+ nasal edema neck was supple no adenopathy lungs are clear no wheezing      Assessment & Plan:  Allergic rhinitis plan Zyrtec plain each bedtime along with Flonase nasal spray

## 2012-09-02 ENCOUNTER — Other Ambulatory Visit: Payer: Self-pay | Admitting: Family Medicine

## 2012-09-10 ENCOUNTER — Telehealth: Payer: Self-pay | Admitting: Family Medicine

## 2012-09-10 MED ORDER — VARENICLINE TARTRATE 1 MG PO TABS: 1.0000 mg | ORAL_TABLET | Freq: Every day | ORAL | Status: DC

## 2012-09-10 NOTE — Telephone Encounter (Signed)
Caller: Uno/Patient; Patient Name: Bryan Haley; PCP: Kelle Darting [facility]); Best Callback Phone Number: [phone] Requesting prescription for Chantrix  to stop smoking. He has used it before and it helped him stop. He uses [facility] on [address]. He is scheduled to come in for physical next month. Medication Questions Protocol.

## 2012-09-13 ENCOUNTER — Other Ambulatory Visit: Payer: Managed Care, Other (non HMO)

## 2012-09-20 ENCOUNTER — Encounter: Payer: Managed Care, Other (non HMO) | Admitting: Family Medicine

## 2012-09-23 ENCOUNTER — Other Ambulatory Visit: Payer: Self-pay | Admitting: Family Medicine

## 2012-10-01 ENCOUNTER — Other Ambulatory Visit: Payer: Self-pay | Admitting: Family Medicine

## 2012-10-10 ENCOUNTER — Ambulatory Visit (INDEPENDENT_AMBULATORY_CARE_PROVIDER_SITE_OTHER): Payer: Managed Care, Other (non HMO) | Admitting: Endocrinology

## 2012-10-10 ENCOUNTER — Encounter: Payer: Self-pay | Admitting: Endocrinology

## 2012-10-10 VITALS — BP 134/74 | HR 65 | Temp 97.7°F | Wt 266.0 lb

## 2012-10-10 DIAGNOSIS — E1065 Type 1 diabetes mellitus with hyperglycemia: Secondary | ICD-10-CM

## 2012-10-10 NOTE — Patient Instructions (Addendum)
Please come back for a follow-up appointment in 4 months check your blood sugar 2 times a day.  vary the time of day when you check, between before the 3 meals, and at bedtime.  also check if you have symptoms of your blood sugar being too high or too low.  please keep a record of the readings and bring it to your next appointment here.  please call us sooner if you are having low blood sugar episodes.  blood tests are being requested for you today.  We'll contact you with results.  pending the test results, please continue the same medications. 

## 2012-10-10 NOTE — Progress Notes (Signed)
Subjective:    Patient ID: Bryan Haley, male    DOB: 14-Mar-1951, 61 y.o.   MRN: [id number]  HPI Pt returns for f/u of insulin-requiring DM (dx'ed 2001; complicated by renal insufficiency). no cbg record.  He has had only 1 episode of hypoglycemia since last ov here, and this was mild. It was in the afternoon.  It is usually highest in the mid-morning.  Past Medical History  Diagnosis Date  . Diabetic retinopathy of both eyes     Laser surgery, Dr. Luciana Axe  . DIABETES MELLITUS, TYPE I 09/02/2008  . DM W/COMPLICATION NOS, TYPE I 08/09/2007  . HYPOGONADISM 06/11/2010  . GOUT 07/11/2007  . HYPERTENSION 07/06/2007  . SLEEP APNEA 10/08/2009  . COLONIC POLYPS, HX OF 10/02/2008  . RENAL INSUFFICIENCY 12/11/2008    No past surgical history on file.  History   Social History  . Marital Status: Married    Spouse Name: N/A    Number of Children: N/A  . Years of Education: N/A   Occupational History  . sales    Social History Main Topics  . Smoking status: Former Smoker    Quit date: 10/02/2010  . Smokeless tobacco: Never Used  . Alcohol Use: Yes     Comment: 1 drink a week  . Drug Use: No  . Sexually Active: Not on file   Other Topics Concern  . Not on file   Social History Narrative   Regular exercise-noPt went to seminar to quit smoking    Current Outpatient Prescriptions on File Prior to Visit  Medication Sig Dispense Refill  . allopurinol (ZYLOPRIM) 300 MG tablet TAKE 1 TABLET EVERY DAY  100 tablet  2  . amLODipine (NORVASC) 10 MG tablet TAKE 1 TABLET EVERY DAY  100 tablet  1  . aspirin 325 MG tablet Take 325 mg by mouth daily.        . carvedilol (COREG) 3.125 MG tablet TAKE 1 TABLET IN THE MORNING  100 tablet  1  . doxazosin (CARDURA) 8 MG tablet TAKE 1 TABLET EVERY DAY  100 tablet  2  . fluticasone (FLONASE) 50 MCG/ACT nasal spray 1 shot up each nostril at bedtime  16 g  6  . furosemide (LASIX) 80 MG tablet TAKE 1 TABLET TWICE A DAY  200 tablet  1  . indomethacin  (INDOCIN SR) 75 MG CR capsule TAKE ONE CAPSULE TWICE A DAY WITH FOOD  200 capsule  2  . INS SYRINGE/NEEDLE 1CC/29G (B-D INSULIN SYRINGE) 29G X 1/2" 1 ML MISC Use as directed       . insulin aspart (NOVOLOG) 100 UNIT/ML injection 3x a day (just before each meal) 25-30-35 units      . insulin NPH (HUMULIN N,NOVOLIN N) 100 UNIT/ML injection Inject 40 Units into the skin at bedtime.  20 mL  3  . Insulin Pen Needle (PEN NEEDLES 3/16") 31G X 5 MM MISC Use as directed 3x daily       . lisinopril (PRINIVIL,ZESTRIL) 40 MG tablet TAKE 1 TABLET TWICE A DAY  200 tablet  3  . losartan (COZAAR) 100 MG tablet TAKE 1 TABLET TWICE A DAY  200 tablet  3  . ONE TOUCH ULTRA TEST test strip TEST TWICE A DAY  200 each  3  . sildenafil (VIAGRA) 100 MG tablet Use as directed       . simvastatin (ZOCOR) 40 MG tablet TAKE 1 TABLET IN THE EVENING  100 tablet  3  . Testosterone (FORTESTA)  10 MG/ACT (2%) GEL Place 4 Squirts onto the skin daily.  60 g  5  . varenicline (CHANTIX CONTINUING MONTH PAK) 1 MG tablet Take 1 tablet (1 mg total) by mouth daily.  903 tablet  3  . [DISCONTINUED] allopurinol (ZYLOPRIM) 300 MG tablet TAKE 1 TABLET EVERY DAY  100 tablet  0  . [DISCONTINUED] indomethacin (INDOCIN SR) 75 MG CR capsule Take 1 capsule (75 mg total) by mouth 2 (two) times daily with a meal.  200 capsule  3    No Known Allergies  Family History  Problem Relation Age of Onset  . Cancer Neg Hx     No family history of colon cancer  . Diabetes Other   . Hyperlipidemia Other   . Hypertension Other     BP 134/74  Pulse 65  Temp 97.7 F (36.5 C) (Oral)  Wt 266 lb (120.657 kg)  SpO2 97%  Review of Systems Denies LOC    Objective:   Physical Exam Pulses: dorsalis pedis intact bilat.   Feet: no deformity.  no ulcer on the feet.  feet are of normal color and temp.  no edema Neuro: sensation is intact to touch on the feet.       Assessment & Plan:  DM, apparently well-controlled

## 2012-10-11 LAB — HEMOGLOBIN A1C: Mean Plasma Glucose: 140 mg/dL — ABNORMAL HIGH (ref <117)

## 2012-10-11 LAB — MICROALBUMIN / CREATININE URINE RATIO: Microalb, Ur: 7.5 mg/dL — ABNORMAL HIGH (ref 0.00–1.89)

## 2012-10-17 ENCOUNTER — Other Ambulatory Visit (INDEPENDENT_AMBULATORY_CARE_PROVIDER_SITE_OTHER): Payer: Managed Care, Other (non HMO)

## 2012-10-17 DIAGNOSIS — Z0000 Encounter for general adult medical examination without abnormal findings: Secondary | ICD-10-CM

## 2012-10-17 LAB — BASIC METABOLIC PANEL
BUN: 27 mg/dL — ABNORMAL HIGH (ref 6–23)
GFR: 52.3 mL/min — ABNORMAL LOW (ref >60.00)
Glucose, Bld: 179 mg/dL — ABNORMAL HIGH (ref 70–99)
Potassium: 4.1 mEq/L (ref 3.5–5.1)

## 2012-10-17 LAB — POCT URINALYSIS DIPSTICK
Glucose, UA: NEGATIVE
Nitrite, UA: NEGATIVE
Urobilinogen, UA: 0.2

## 2012-10-17 LAB — HEPATIC FUNCTION PANEL: Total Bilirubin: 0.5 mg/dL (ref 0.3–1.2)

## 2012-10-17 LAB — LIPID PANEL
Cholesterol: 124 mg/dL (ref 0–200)
Triglycerides: 247 mg/dL — ABNORMAL HIGH (ref 0.0–149.0)

## 2012-10-17 LAB — CBC WITH DIFFERENTIAL/PLATELET
Basophils Absolute: 0 10*3/uL (ref 0.0–0.1)
Eosinophils Absolute: 0.1 10*3/uL (ref 0.0–0.7)
HCT: 39.5 % (ref 39.0–52.0)
Lymphs Abs: 1 10*3/uL (ref 0.7–4.0)
Monocytes Absolute: 0.5 10*3/uL (ref 0.1–1.0)
Monocytes Relative: 7.4 % (ref 3.0–12.0)
Platelets: 132 10*3/uL — ABNORMAL LOW (ref 150.0–400.0)
RDW: 14.1 % (ref 11.5–14.6)

## 2012-10-17 LAB — PSA: PSA: 1.32 ng/mL (ref 0.10–4.00)

## 2012-10-17 LAB — TSH: TSH: 2.91 u[IU]/mL (ref 0.35–5.50)

## 2012-10-17 LAB — LDL CHOLESTEROL, DIRECT: Direct LDL: 53.7 mg/dL

## 2012-10-30 ENCOUNTER — Encounter: Payer: Self-pay | Admitting: Family Medicine

## 2012-10-30 ENCOUNTER — Ambulatory Visit (INDEPENDENT_AMBULATORY_CARE_PROVIDER_SITE_OTHER): Payer: Managed Care, Other (non HMO) | Admitting: Family Medicine

## 2012-10-30 VITALS — BP 120/70 | Temp 98.4°F | Ht 70.5 in | Wt 270.0 lb

## 2012-10-30 DIAGNOSIS — N259 Disorder resulting from impaired renal tubular function, unspecified: Secondary | ICD-10-CM

## 2012-10-30 DIAGNOSIS — J309 Allergic rhinitis, unspecified: Secondary | ICD-10-CM

## 2012-10-30 DIAGNOSIS — R809 Proteinuria, unspecified: Secondary | ICD-10-CM

## 2012-10-30 DIAGNOSIS — Z23 Encounter for immunization: Secondary | ICD-10-CM

## 2012-10-30 DIAGNOSIS — M109 Gout, unspecified: Secondary | ICD-10-CM

## 2012-10-30 DIAGNOSIS — I10 Essential (primary) hypertension: Secondary | ICD-10-CM

## 2012-10-30 DIAGNOSIS — E785 Hyperlipidemia, unspecified: Secondary | ICD-10-CM

## 2012-10-30 DIAGNOSIS — G4730 Sleep apnea, unspecified: Secondary | ICD-10-CM

## 2012-10-30 DIAGNOSIS — F17200 Nicotine dependence, unspecified, uncomplicated: Secondary | ICD-10-CM

## 2012-10-30 DIAGNOSIS — E1065 Type 1 diabetes mellitus with hyperglycemia: Secondary | ICD-10-CM

## 2012-10-30 MED ORDER — CARVEDILOL 3.125 MG PO TABS: ORAL_TABLET | ORAL | Status: DC

## 2012-10-30 MED ORDER — ALLOPURINOL 300 MG PO TABS: 300.0000 mg | ORAL_TABLET | Freq: Every day | ORAL | Status: DC

## 2012-10-30 MED ORDER — INDOMETHACIN ER 75 MG PO CPCR: ORAL_CAPSULE | ORAL | Status: DC

## 2012-10-30 MED ORDER — SIMVASTATIN 40 MG PO TABS: 40.0000 mg | ORAL_TABLET | Freq: Every day | ORAL | Status: DC

## 2012-10-30 MED ORDER — FUROSEMIDE 80 MG PO TABS: ORAL_TABLET | ORAL | Status: DC

## 2012-10-30 MED ORDER — LOSARTAN POTASSIUM 100 MG PO TABS: ORAL_TABLET | ORAL | Status: DC

## 2012-10-30 MED ORDER — FLUTICASONE PROPIONATE 50 MCG/ACT NA SUSP: NASAL | Status: DC

## 2012-10-30 MED ORDER — AMLODIPINE BESYLATE 10 MG PO TABS: 10.0000 mg | ORAL_TABLET | Freq: Every day | ORAL | Status: DC

## 2012-10-30 MED ORDER — DOXAZOSIN MESYLATE 8 MG PO TABS: ORAL_TABLET | ORAL | Status: DC

## 2012-10-30 MED ORDER — LISINOPRIL 40 MG PO TABS: ORAL_TABLET | ORAL | Status: DC

## 2012-10-30 NOTE — Progress Notes (Signed)
Subjective:    Patient ID: Bryan Haley, male    DOB: 1951-04-12, 61 y.o.   MRN: [id number]  HPI Bryan Haley is a 61 year old married male,,,,,,,,,, down to one cigarette a day on the Chantix program one half tab twice a day,,,, who comes in today for evaluation of gout, hypertension, allergic rhinitis, osteoarthritis, type 1 diabetes, erectile dysfunction, low testosterone, hyperlipidemia and smoking cessation  His medication reviewed in detail and there've been no changes except these decrease this Chantix dental or half a tab twice daily and he still is smoking about one cigarette a day.  Dr. Revonda Standard has decreased his insulin because his A1c was within normal range. He states he feels well and has no complaints except a spot on his right and left ear and one on his hand that seem to be irritated.  He gets routine eye care, dental care, colonoscopy and GI, tetanus 2003, Pneumovax 2007, seasonal flu shot 2013, shingles 2012. Tetanus booster today   Review of Systems  Constitutional: Negative.   HENT: Negative.   Eyes: Negative.   Respiratory: Negative.   Cardiovascular: Negative.   Gastrointestinal: Negative.   Genitourinary: Negative.   Musculoskeletal: Negative.   Skin: Negative.   Neurological: Negative.   Hematological: Negative.   Psychiatric/Behavioral: Negative.        Objective:   Physical Exam  Constitutional: He is oriented to person, place, and time. He appears well-developed and well-nourished.  HENT:  Head: Normocephalic and atraumatic.  Right Ear: External ear normal.  Left Ear: External ear normal.  Nose: Nose normal.  Mouth/Throat: Oropharynx is clear and moist.  Eyes: Conjunctivae normal and EOM are normal. Pupils are equal, round, and reactive to light.  Neck: Normal range of motion. Neck supple. No JVD present. No tracheal deviation present. No thyromegaly present.  Cardiovascular: Normal rate, regular rhythm, normal heart sounds and intact distal pulses.   Exam reveals no gallop and no friction rub.   No murmur heard.      No carotid bruits aorta normal peripheral pulses 2+ out of 4  Pulmonary/Chest: Effort normal and breath sounds normal. No stridor. No respiratory distress. He has no wheezes. He has no rales. He exhibits no tenderness.  Abdominal: Soft. Bowel sounds are normal. He exhibits no distension and no mass. There is no tenderness. There is no rebound and no guarding.       Large panniculus  Genitourinary: Rectum normal, prostate normal and penis normal. Guaiac negative stool. No penile tenderness.  Musculoskeletal: Normal range of motion. He exhibits no edema and no tenderness.  Lymphadenopathy:    He has no cervical adenopathy.  Neurological: He is alert and oriented to person, place, and time. He has normal reflexes. No cranial nerve deficit. He exhibits normal muscle tone.  Skin: Skin is warm and dry. No rash noted. No erythema. No pallor.       Crusty lesion right and left ears and left hand return for removal cryo-  Psychiatric: He has a normal mood and affect. His behavior is normal. Judgment and thought content normal.          Assessment & Plan:  Diabetes type 1 continue followup by Dr. Lorel Monaco  Hypertension continue current medications  Hyperlipidemia continue current medications  Hypergonadism and erectile dysfunction he is on testosterone supplement because of the recent data about heart attacks with testosterone supplements I asked him to discuss this with Dr. Lorel Monaco  Tobacco abuse down to one cigarette a day continue the Chantix one  half tablet twice daily followup in 3 months  Osteoarthritis continue indomethacin 75 mg twice daily  Gout continue allopurinol 300 mg daily  Abnormal lesions right left ear and left hand return for cryo- treatment  Tetanus booster and cardiogram today

## 2012-10-30 NOTE — Patient Instructions (Signed)
Continue your current medications  Continue the Chantix one half tab twice daily until you stop smoking for a full month and then begin to taper the Chantix by taking a half a tablet daily for a month and a half a tablet Monday Wednesday Friday for the second month  Return sometime in the next couple weeks to treat the lesions we discussed on your ear and hand

## 2012-11-12 ENCOUNTER — Ambulatory Visit (INDEPENDENT_AMBULATORY_CARE_PROVIDER_SITE_OTHER): Payer: Managed Care, Other (non HMO) | Admitting: Family Medicine

## 2012-11-12 ENCOUNTER — Encounter: Payer: Self-pay | Admitting: Family Medicine

## 2012-11-12 DIAGNOSIS — L820 Inflamed seborrheic keratosis: Secondary | ICD-10-CM | POA: Insufficient documentation

## 2012-11-12 NOTE — Patient Instructions (Signed)
Return when necessary 

## 2012-11-12 NOTE — Progress Notes (Signed)
  Subjective:    Patient ID: Bryan Haley, male    DOB: 1951/06/11, 61 y.o.   MRN: [id number]  HPI Bryan Haley is a 61 year old male who comes in today for treatment of 3 lesions #1 left ear #2 right ear #3 left hand. The 3 lesions are inflamed  He was taken to the treatment room and all the lesions were frozen with liquid nitrogen x3   Review of Systems Inflamed seborrheic keratosis dermatologic review of systems otherwise negative    Objective:   Physical Exam Procedure see above       Assessment & Plan:  Inflamed seborrheic keratosis x3 treated with cryo-

## 2012-11-28 DIAGNOSIS — K635 Polyp of colon: Secondary | ICD-10-CM

## 2012-11-28 HISTORY — DX: Polyp of colon: K63.5

## 2012-12-04 ENCOUNTER — Other Ambulatory Visit: Payer: Self-pay | Admitting: *Deleted

## 2012-12-05 ENCOUNTER — Other Ambulatory Visit: Payer: Self-pay

## 2012-12-05 MED ORDER — INSULIN NPH (HUMAN) (ISOPHANE) 100 UNIT/ML ~~LOC~~ SUSP: 40.0000 [IU] | Freq: Every day | SUBCUTANEOUS | Status: DC

## 2012-12-05 MED ORDER — "PEN NEEDLES 3/16"" 31G X 5 MM MISC": 1.0000 "pen " | Freq: Three times a day (TID) | Status: DC

## 2012-12-31 ENCOUNTER — Other Ambulatory Visit: Payer: Self-pay | Admitting: *Deleted

## 2012-12-31 MED ORDER — TESTOSTERONE 10 MG/ACT (2%) TD GEL: 4.0000 | Freq: Every day | TRANSDERMAL | Status: DC

## 2013-01-29 ENCOUNTER — Other Ambulatory Visit: Payer: Self-pay | Admitting: Family Medicine

## 2013-01-30 ENCOUNTER — Other Ambulatory Visit: Payer: Self-pay

## 2013-01-30 MED ORDER — INSULIN NPH (HUMAN) (ISOPHANE) 100 UNIT/ML ~~LOC~~ SUSP: 40.0000 [IU] | Freq: Every day | SUBCUTANEOUS | Status: DC

## 2013-02-22 ENCOUNTER — Ambulatory Visit (INDEPENDENT_AMBULATORY_CARE_PROVIDER_SITE_OTHER): Payer: Managed Care, Other (non HMO) | Admitting: Endocrinology

## 2013-02-22 VITALS — BP 128/74 | HR 75 | Wt 273.0 lb

## 2013-02-22 DIAGNOSIS — E1065 Type 1 diabetes mellitus with hyperglycemia: Secondary | ICD-10-CM

## 2013-02-22 NOTE — Patient Instructions (Signed)
Please come back for a follow-up appointment in 4 months check your blood sugar 2 times a day.  vary the time of day when you check, between before the 3 meals, and at bedtime.  also check if you have symptoms of your blood sugar being too high or too low.  please keep a record of the readings and bring it to your next appointment here.  please call us sooner if you are having low blood sugar episodes.  blood tests are being requested for you today.  We'll contact you with results.  pending the test results, please continue the same medications.

## 2013-02-22 NOTE — Progress Notes (Signed)
Subjective:    Patient ID: Bryan Haley, male    DOB: 1951/08/13, 62 y.o.   MRN: [id number]  HPI Pt returns for f/u of insulin-requiring DM (dx'ed 2001; complicated by renal insufficiency).  pt states he feels well in general.  no cbg record, but states cbg's are well-controlled.  Since last ov, he has had only 1 episode of hypoglycemia, and that was mild.  It is in general lowest in the afternoon.   Past Medical History  Diagnosis Date  . Diabetic retinopathy of both eyes     Laser surgery, Dr. Luciana Axe  . DIABETES MELLITUS, TYPE I 09/02/2008  . DM W/COMPLICATION NOS, TYPE I 08/09/2007  . HYPOGONADISM 06/11/2010  . GOUT 07/11/2007  . HYPERTENSION 07/06/2007  . SLEEP APNEA 10/08/2009  . COLONIC POLYPS, HX OF 10/02/2008  . RENAL INSUFFICIENCY 12/11/2008    No past surgical history on file.  History   Social History  . Marital Status: Married    Spouse Name: N/A    Number of Children: N/A  . Years of Education: N/A   Occupational History  . sales    Social History Main Topics  . Smoking status: Current Some Day Smoker    Types: Cigarettes    Last Attempt to Quit: 10/02/2010  . Smokeless tobacco: Never Used  . Alcohol Use: Yes     Comment: 1 drink a week  . Drug Use: No  . Sexually Active: Not on file   Other Topics Concern  . Not on file   Social History Narrative   Regular exercise-no   Pt went to seminar to quit smoking    Current Outpatient Prescriptions on File Prior to Visit  Medication Sig Dispense Refill  . allopurinol (ZYLOPRIM) 300 MG tablet Take 1 tablet (300 mg total) by mouth daily.  100 tablet  3  . amLODipine (NORVASC) 10 MG tablet Take 1 tablet (10 mg total) by mouth daily.  100 tablet  3  . aspirin 325 MG tablet Take 325 mg by mouth daily.        . carvedilol (COREG) 3.125 MG tablet One every morning  100 tablet  3  . carvedilol (COREG) 3.125 MG tablet TAKE 1 TABLET IN THE MORNING  100 tablet  3  . doxazosin (CARDURA) 8 MG tablet 1 each bedtime  100  tablet  3  . fluticasone (FLONASE) 50 MCG/ACT nasal spray 1 shot up each nostril at bedtime  16 g  6  . furosemide (LASIX) 80 MG tablet 1 by mouth twice a day  200 tablet  3  . indomethacin (INDOCIN SR) 75 MG CR capsule 1 by mouth twice a day  200 capsule  3  . INS SYRINGE/NEEDLE 1CC/29G (B-D INSULIN SYRINGE) 29G X 1/2" 1 ML MISC Use as directed       . insulin aspart (NOVOLOG) 100 UNIT/ML injection 3x a day (just before each meal) 25-25-35 units      . insulin NPH (HUMULIN N,NOVOLIN N) 100 UNIT/ML injection Inject 40 Units into the skin at bedtime.  20 mL  3  . Insulin Pen Needle (PEN NEEDLES 3/16") 31G X 5 MM MISC Inject 1 pen as directed 3 (three) times daily. Use as directed 3x daily  100 each  6  . lisinopril (PRINIVIL,ZESTRIL) 40 MG tablet 1 by mouth twice a day  200 tablet  3  . losartan (COZAAR) 100 MG tablet One tablet twice daily  200 tablet  3  . ONE TOUCH ULTRA TEST  test strip TEST TWICE A DAY  200 each  3  . sildenafil (VIAGRA) 100 MG tablet Use as directed       . simvastatin (ZOCOR) 40 MG tablet Take 1 tablet (40 mg total) by mouth at bedtime.  100 tablet  3  . Testosterone (FORTESTA) 10 MG/ACT (2%) GEL Place 4 Squirts onto the skin daily.  60 g  5  . varenicline (CHANTIX CONTINUING MONTH PAK) 1 MG tablet Take 1 tablet (1 mg total) by mouth daily.  903 tablet  3   No current facility-administered medications on file prior to visit.    No Known Allergies  Family History  Problem Relation Age of Onset  . Cancer Neg Hx     No family history of colon cancer  . Diabetes Other   . Hyperlipidemia Other   . Hypertension Other     BP 128/74  Pulse 75  Wt 273 lb (123.832 kg)  BMI 38.6 kg/m2  SpO2 98%  Review of Systems Denies LOC    Objective:   Physical Exam Pulses: dorsalis pedis intact bilat.   Feet: no deformity.  no ulcer on the feet.  feet are of normal color and temp.  no edema Neuro: sensation is intact to touch on the feet.     Assessment & Plan:  DM:  He  may be able to further reduce the insulin.

## 2013-02-23 ENCOUNTER — Encounter: Payer: Self-pay | Admitting: Family Medicine

## 2013-02-26 ENCOUNTER — Other Ambulatory Visit: Payer: Self-pay | Admitting: *Deleted

## 2013-02-26 MED ORDER — INSULIN ASPART 100 UNIT/ML ~~LOC~~ SOLN: SUBCUTANEOUS | Status: DC

## 2013-02-26 MED ORDER — "PEN NEEDLES 3/16"" 31G X 5 MM MISC": 1.0000 "pen " | Freq: Three times a day (TID) | Status: DC

## 2013-02-26 NOTE — Telephone Encounter (Signed)
Refill done.  

## 2013-07-08 ENCOUNTER — Encounter: Payer: Self-pay | Admitting: Endocrinology

## 2013-07-08 ENCOUNTER — Ambulatory Visit (INDEPENDENT_AMBULATORY_CARE_PROVIDER_SITE_OTHER): Payer: Managed Care, Other (non HMO) | Admitting: Endocrinology

## 2013-07-08 ENCOUNTER — Other Ambulatory Visit: Payer: Self-pay | Admitting: *Deleted

## 2013-07-08 VITALS — BP 122/80 | HR 78 | Ht 70.0 in | Wt 277.0 lb

## 2013-07-08 DIAGNOSIS — E1029 Type 1 diabetes mellitus with other diabetic kidney complication: Secondary | ICD-10-CM

## 2013-07-08 DIAGNOSIS — E1065 Type 1 diabetes mellitus with hyperglycemia: Secondary | ICD-10-CM

## 2013-07-08 DIAGNOSIS — E291 Testicular hypofunction: Secondary | ICD-10-CM

## 2013-07-08 MED ORDER — TESTOSTERONE 10 MG/ACT (2%) TD GEL: 4.0000 | Freq: Every day | TRANSDERMAL | Status: DC

## 2013-07-08 NOTE — Patient Instructions (Addendum)
Please come back for a follow-up appointment in 4 months check your blood sugar 2 times a day.  vary the time of day when you check, between before the 3 meals, and at bedtime.  also check if you have symptoms of your blood sugar being too high or too low.  please keep a record of the readings and bring it to your next appointment here.  please call us sooner if you are having low blood sugar episodes.  blood tests are being requested for you today.  We'll contact you with results.  pending the test results, please continue the same insulins.

## 2013-07-08 NOTE — Progress Notes (Signed)
Subjective:    Patient ID: Bryan Haley, male    DOB: 28-Jul-1951, 62 y.o.   MRN: [id number]  HPI Pt returns for f/u of insulin-requiring DM (dx'ed 2001; he has mild if any neuropathy of the lower extremities, but he has associated renal insufficiency).  pt states he feels well in general.  no cbg record, but states cbg's are well-controlled.  Since last ov, he has again had only 1 episode of hypoglycemia, and that was mild.  It is in general lowest in the afternoon.   Past Medical History  Diagnosis Date  . Diabetic retinopathy of both eyes     Laser surgery, Dr. Luciana Axe  . DIABETES MELLITUS, TYPE I 09/02/2008  . DM W/COMPLICATION NOS, TYPE I 08/09/2007  . HYPOGONADISM 06/11/2010  . GOUT 07/11/2007  . HYPERTENSION 07/06/2007  . SLEEP APNEA 10/08/2009  . COLONIC POLYPS, HX OF 10/02/2008  . RENAL INSUFFICIENCY 12/11/2008    No past surgical history on file.  History   Social History  . Marital Status: Married    Spouse Name: N/A    Number of Children: N/A  . Years of Education: N/A   Occupational History  . sales    Social History Main Topics  . Smoking status: Current Some Day Smoker    Types: Cigarettes    Last Attempt to Quit: 10/02/2010  . Smokeless tobacco: Never Used  . Alcohol Use: Yes     Comment: 1 drink a week  . Drug Use: No  . Sexual Activity: Not on file   Other Topics Concern  . Not on file   Social History Narrative   Regular exercise-no   Pt went to seminar to quit smoking    Current Outpatient Prescriptions on File Prior to Visit  Medication Sig Dispense Refill  . allopurinol (ZYLOPRIM) 300 MG tablet Take 1 tablet (300 mg total) by mouth daily.  100 tablet  3  . amLODipine (NORVASC) 10 MG tablet Take 1 tablet (10 mg total) by mouth daily.  100 tablet  3  . aspirin 325 MG tablet Take 325 mg by mouth daily.        . carvedilol (COREG) 3.125 MG tablet One every morning  100 tablet  3  . doxazosin (CARDURA) 8 MG tablet 1 each bedtime  100 tablet  3  .  fluticasone (FLONASE) 50 MCG/ACT nasal spray 1 shot up each nostril at bedtime  16 g  6  . furosemide (LASIX) 80 MG tablet 1 by mouth twice a day  200 tablet  3  . indomethacin (INDOCIN SR) 75 MG CR capsule 1 by mouth twice a day  200 capsule  3  . INS SYRINGE/NEEDLE 1CC/29G (B-D INSULIN SYRINGE) 29G X 1/2" 1 ML MISC Use as directed       . insulin NPH (HUMULIN N,NOVOLIN N) 100 UNIT/ML injection Inject 40 Units into the skin at bedtime.  20 mL  3  . Insulin Pen Needle (PEN NEEDLES 3/16") 31G X 5 MM MISC Inject 1 pen as directed 3 (three) times daily. Use as directed 3x daily  100 each  6  . lisinopril (PRINIVIL,ZESTRIL) 40 MG tablet 1 by mouth twice a day  200 tablet  3  . losartan (COZAAR) 100 MG tablet One tablet twice daily  200 tablet  3  . ONE TOUCH ULTRA TEST test strip TEST TWICE A DAY  200 each  3  . sildenafil (VIAGRA) 100 MG tablet Use as directed       .  simvastatin (ZOCOR) 40 MG tablet Take 1 tablet (40 mg total) by mouth at bedtime.  100 tablet  3  . varenicline (CHANTIX CONTINUING MONTH PAK) 1 MG tablet Take 1 tablet (1 mg total) by mouth daily.  903 tablet  3   No current facility-administered medications on file prior to visit.    No Known Allergies  Family History  Problem Relation Age of Onset  . Cancer Neg Hx     No family history of colon cancer  . Diabetes Other   . Hyperlipidemia Other   . Hypertension Other    BP 122/80  Pulse 78  Ht 5\' 10"  (1.778 m)  Wt 277 lb (125.646 kg)  BMI 39.75 kg/m2  SpO2 98%  Review of Systems Denies weight change and LOC    Objective:   Physical Exam VITAL SIGNS:  See vs page GENERAL: no distress  Lab Results  Component Value Date   HGBA1C 7.1* 07/08/2013   Lab Results  Component Value Date   TESTOSTERONE 155.71* 07/08/2013      Assessment & Plan:  Proteinuria.  This has been stable due to good control of DM, HTN, and dyslipidemia. Hypogonadism, inadeq replacement--? Compliance. DM: he needs increased rx.

## 2013-07-19 ENCOUNTER — Encounter: Payer: Self-pay | Admitting: Gastroenterology

## 2013-07-19 ENCOUNTER — Telehealth: Payer: Self-pay | Admitting: Endocrinology

## 2013-07-19 NOTE — Telephone Encounter (Signed)
Here is the message from epic: Your blood sugar is just a little high. Please increase humalog to 3 TIMES A DAY (JUST BEFORE EACH MEAL) 30-35-45 UNITS. Also, your testosterone is low. Have you recently missed any doses?

## 2013-07-19 NOTE — Telephone Encounter (Signed)
Pt called requesting lab results

## 2013-07-22 ENCOUNTER — Telehealth: Payer: Self-pay | Admitting: Endocrinology

## 2013-07-22 NOTE — Telephone Encounter (Signed)
Pt returning call. CB his cell number # [phone] / [name]

## 2013-07-24 ENCOUNTER — Encounter: Payer: Self-pay | Admitting: Gastroenterology

## 2013-08-03 ENCOUNTER — Other Ambulatory Visit: Payer: Self-pay | Admitting: Family Medicine

## 2013-08-09 ENCOUNTER — Other Ambulatory Visit: Payer: Self-pay | Admitting: *Deleted

## 2013-08-09 MED ORDER — INSULIN NPH (HUMAN) (ISOPHANE) 100 UNIT/ML ~~LOC~~ SUSP: 40.0000 [IU] | Freq: Every day | SUBCUTANEOUS | Status: DC

## 2013-09-01 ENCOUNTER — Other Ambulatory Visit: Payer: Self-pay | Admitting: Family Medicine

## 2013-09-12 ENCOUNTER — Ambulatory Visit (INDEPENDENT_AMBULATORY_CARE_PROVIDER_SITE_OTHER): Payer: Managed Care, Other (non HMO)

## 2013-09-12 ENCOUNTER — Other Ambulatory Visit: Payer: Self-pay | Admitting: Family Medicine

## 2013-09-12 DIAGNOSIS — Z23 Encounter for immunization: Secondary | ICD-10-CM

## 2013-09-26 ENCOUNTER — Ambulatory Visit (AMBULATORY_SURGERY_CENTER): Payer: Self-pay | Admitting: *Deleted

## 2013-09-26 VITALS — Ht 71.0 in | Wt 280.2 lb

## 2013-09-26 DIAGNOSIS — Z86010 Personal history of colonic polyps: Secondary | ICD-10-CM

## 2013-09-26 MED ORDER — PEG-KCL-NACL-NASULF-NA ASC-C 100 G PO SOLR: ORAL | Status: DC

## 2013-09-26 NOTE — Progress Notes (Signed)
No egg or soy allergy 

## 2013-10-04 ENCOUNTER — Encounter: Payer: Self-pay | Admitting: Gastroenterology

## 2013-10-10 ENCOUNTER — Encounter: Payer: Self-pay | Admitting: Gastroenterology

## 2013-10-10 ENCOUNTER — Ambulatory Visit (AMBULATORY_SURGERY_CENTER): Payer: Managed Care, Other (non HMO) | Admitting: Gastroenterology

## 2013-10-10 VITALS — BP 153/76 | HR 55 | Temp 97.7°F | Resp 13 | Ht 71.0 in | Wt 280.0 lb

## 2013-10-10 DIAGNOSIS — Z86010 Personal history of colonic polyps: Secondary | ICD-10-CM

## 2013-10-10 DIAGNOSIS — D126 Benign neoplasm of colon, unspecified: Secondary | ICD-10-CM

## 2013-10-10 MED ORDER — SODIUM CHLORIDE 0.9 % IV SOLN: 500.0000 mL | INTRAVENOUS | Status: DC

## 2013-10-10 NOTE — Progress Notes (Signed)
Called to room to assist during endoscopic procedure.  Patient ID and intended procedure confirmed with present staff. Received instructions for my participation in the procedure from the performing physician.  

## 2013-10-10 NOTE — Progress Notes (Signed)
Stable to RR 

## 2013-10-10 NOTE — Op Note (Signed)
[facility] [address] [place], [address]   COLONOSCOPY PROCEDURE REPORT  PATIENT: Bryan Haley, Bryan Haley  MR#: [id number] BIRTHDATE: 1951-04-07 , 62  yrs. old GENDER: Male ENDOSCOPIST: Meryl Dare, MD, [facility] PROCEDURE DATE:  10/10/2013 PROCEDURE:   Colonoscopy with snare polypectomy First Screening Colonoscopy - Avg.  risk and is 50 yrs.  old or older - No.  Prior Negative Screening - Now for repeat screening. N/A  History of Adenoma - Now for follow-up colonoscopy & has been > or = to 3 yrs.  Yes hx of adenoma.  Has been 3 or more years since last colonoscopy.  Polyps Removed Today? Yes. ASA CLASS:   Class II INDICATIONS:Patient's personal history of adenomatous colon polyps.  MEDICATIONS: MAC sedation, administered by CRNA and propofol (Diprivan) 250mg  IV DESCRIPTION OF PROCEDURE:   After the risks benefits and alternatives of the procedure were thoroughly explained, informed consent was obtained.  A digital rectal exam revealed no abnormalities of the rectum.   The LB [id number]  endoscope was introduced through the anus and advanced to the cecum, which was identified by both the appendix and ileocecal valve. No adverse events experienced.   The quality of the prep was excellent, using MoviPrep  The instrument was then slowly withdrawn as the colon was fully examined.  COLON FINDINGS: A sessile polyp measuring 5 mm in size was found in the sigmoid colon.  A polypectomy was performed with a cold snare. The resection was complete and the polyp tissue was completely retrieved.   Mild diverticulosis was noted in the transverse colon and descending colon.   The colon was otherwise normal.  There was no diverticulosis, inflammation, polyps or cancers unless previously stated.  Retroflexed views revealed small  internal hemorrhoids. The time to cecum=1 minutes 15 seconds.  Withdrawal time=10 minutes 47 seconds.  The scope was withdrawn and the procedure  completed. COMPLICATIONS: There were no complications.  ENDOSCOPIC IMPRESSION: 1.   Sessile polyp measuring 5 mm in the sigmoid colon; polypectomy performed with a cold snare 2.   Mild diverticulosis in the transverse colon and descending colon 3.   Small internal hemorrhoids  RECOMMENDATIONS: 1.  Await pathology results 2.  High fiber diet with liberal fluid intake. 3.  Repeat Colonoscopy in 5 years.  eSigned:  Meryl Dare, MD, [facility] 10/10/2013 9:54 AM

## 2013-10-10 NOTE — Progress Notes (Signed)
No complaints noted in the recovery room. Maw   

## 2013-10-10 NOTE — Patient Instructions (Signed)
YOU HAD AN ENDOSCOPIC PROCEDURE TODAY AT THE [facility]: Refer to the procedure report that was given to you for any specific questions about what was found during the examination.  If the procedure report does not answer your questions, please call your gastroenterologist to clarify.  If you requested that your care partner not be given the details of your procedure findings, then the procedure report has been included in a sealed envelope for you to review at your convenience later.  YOU SHOULD EXPECT: Some feelings of bloating in the abdomen. Passage of more gas than usual.  Walking can help get rid of the air that was put into your GI tract during the procedure and reduce the bloating. If you had a lower endoscopy (such as a colonoscopy or flexible sigmoidoscopy) you may notice spotting of blood in your stool or on the toilet paper. If you underwent a bowel prep for your procedure, then you may not have a normal bowel movement for a few days.  DIET: Your first meal following the procedure should be a light meal and then it is ok to progress to your normal diet.  A half-sandwich or bowl of soup is an example of a good first meal.  Heavy or fried foods are harder to digest and may make you feel nauseous or bloated.  Likewise meals heavy in dairy and vegetables can cause extra gas to form and this can also increase the bloating.  Drink plenty of fluids but you should avoid alcoholic beverages for 24 hours.  ACTIVITY: Your care partner should take you home directly after the procedure.  You should plan to take it easy, moving slowly for the rest of the day.  You can resume normal activity the day after the procedure however you should NOT DRIVE or use heavy machinery for 24 hours (because of the sedation medicines used during the test).    SYMPTOMS TO REPORT IMMEDIATELY: A gastroenterologist can be reached at any hour.  During normal business hours, 8:30 AM to 5:00 PM Monday through Friday,  call [phone].  After hours and on weekends, please call the GI answering service at [phone] who will take a message and have the physician on call contact you.   Following lower endoscopy (colonoscopy or flexible sigmoidoscopy):  Excessive amounts of blood in the stool  Significant tenderness or worsening of abdominal pains  Swelling of the abdomen that is new, acute  Fever of 100F or higher   FOLLOW UP: If any biopsies were taken you will be contacted by phone or by letter within the next 1-3 weeks.  Call your gastroenterologist if you have not heard about the biopsies in 3 weeks.  Our staff will call the home number listed on your records the next business day following your procedure to check on you and address any questions or concerns that you may have at that time regarding the information given to you following your procedure. This is a courtesy call and so if there is no answer at the home number and we have not heard from you through the emergency physician on call, we will assume that you have returned to your regular daily activities without incident.  SIGNATURES/CONFIDENTIALITY: You and/or your care partner have signed paperwork which will be entered into your electronic medical record.  These signatures attest to the fact that that the information above on your After Visit Summary has been reviewed and is understood.  Full responsibility of the confidentiality of   this discharge information lies with you and/or your care-partner.    Handouts were given to your care partner on polyps, diverticulosis, a high fiber diet with liberal fluid intake, and hemorrhoids. You may resume your current medications today. Your blood sugar was 216 in the recovery room.  Please call if any questions or concerns.

## 2013-10-11 ENCOUNTER — Telehealth: Payer: Self-pay | Admitting: *Deleted

## 2013-10-11 NOTE — Telephone Encounter (Signed)
  Follow up Call-  Call back number 10/10/2013  Post procedure Call Back phone  # [phone]  Permission to leave phone message Yes     Patient questions:  Do you have a fever, pain , or abdominal swelling? no Pain Score  0 *  Have you tolerated food without any problems? yes  Have you been able to return to your normal activities? yes  Do you have any questions about your discharge instructions: Diet   no Medications  no Follow up visit  no  Do you have questions or concerns about your Care? no  Actions: * If pain score is 4 or above: No action needed, pain <4.

## 2013-10-16 ENCOUNTER — Encounter: Payer: Self-pay | Admitting: Gastroenterology

## 2013-10-28 ENCOUNTER — Other Ambulatory Visit (INDEPENDENT_AMBULATORY_CARE_PROVIDER_SITE_OTHER): Payer: Managed Care, Other (non HMO)

## 2013-10-28 DIAGNOSIS — Z0000 Encounter for general adult medical examination without abnormal findings: Secondary | ICD-10-CM

## 2013-10-28 LAB — HEPATIC FUNCTION PANEL
Alkaline Phosphatase: 72 U/L (ref 39–117)
Bilirubin, Direct: 0.1 mg/dL (ref 0.0–0.3)
Total Bilirubin: 0.6 mg/dL (ref 0.3–1.2)

## 2013-10-28 LAB — HEMOGLOBIN A1C: Hgb A1c MFr Bld: 8.3 % — ABNORMAL HIGH (ref 4.6–6.5)

## 2013-10-28 LAB — POCT URINALYSIS DIPSTICK
Bilirubin, UA: NEGATIVE
Blood, UA: NEGATIVE
Ketones, UA: NEGATIVE
Nitrite, UA: NEGATIVE
Spec Grav, UA: 1.015
Urobilinogen, UA: 0.2
pH, UA: 5.5

## 2013-10-28 LAB — MICROALBUMIN / CREATININE URINE RATIO
Creatinine,U: 28.4 mg/dL
Microalb Creat Ratio: 95.2 mg/g — ABNORMAL HIGH (ref 0.0–30.0)
Microalb, Ur: 27 mg/dL — ABNORMAL HIGH (ref 0.0–1.9)

## 2013-10-28 LAB — CBC WITH DIFFERENTIAL/PLATELET
Eosinophils Absolute: 0.1 10*3/uL (ref 0.0–0.7)
Eosinophils Relative: 1.5 % (ref 0.0–5.0)
HCT: 39.1 % (ref 39.0–52.0)
Lymphocytes Relative: 17.2 % (ref 12.0–46.0)
Lymphs Abs: 1.2 10*3/uL (ref 0.7–4.0)
MCHC: 33.9 g/dL (ref 30.0–36.0)
MCV: 95.1 fl (ref 78.0–100.0)
Monocytes Absolute: 0.5 10*3/uL (ref 0.1–1.0)
Neutrophils Relative %: 74.4 % (ref 43.0–77.0)
Platelets: 137 10*3/uL — ABNORMAL LOW (ref 150.0–400.0)
RDW: 13.7 % (ref 11.5–14.6)
WBC: 6.7 10*3/uL (ref 4.5–10.5)

## 2013-10-28 LAB — BASIC METABOLIC PANEL
BUN: 16 mg/dL (ref 6–23)
CO2: 30 mEq/L (ref 19–32)
Chloride: 102 mEq/L (ref 96–112)
Creatinine, Ser: 1.3 mg/dL (ref 0.4–1.5)
Glucose, Bld: 221 mg/dL — ABNORMAL HIGH (ref 70–99)

## 2013-10-28 LAB — LIPID PANEL
Cholesterol: 159 mg/dL (ref 0–200)
HDL: 32.2 mg/dL — ABNORMAL LOW (ref >39.00)
Total CHOL/HDL Ratio: 5
Triglycerides: 467 mg/dL — ABNORMAL HIGH (ref 0.0–149.0)
VLDL: 93.4 mg/dL — ABNORMAL HIGH (ref 0.0–40.0)

## 2013-10-28 LAB — PSA: PSA: 1.15 ng/mL (ref 0.10–4.00)

## 2013-10-28 LAB — LDL CHOLESTEROL, DIRECT: Direct LDL: 56.1 mg/dL

## 2013-10-28 LAB — TSH: TSH: 3.02 u[IU]/mL (ref 0.35–5.50)

## 2013-10-31 ENCOUNTER — Encounter: Payer: Managed Care, Other (non HMO) | Admitting: Family Medicine

## 2013-11-04 ENCOUNTER — Ambulatory Visit (INDEPENDENT_AMBULATORY_CARE_PROVIDER_SITE_OTHER): Payer: Managed Care, Other (non HMO) | Admitting: Family Medicine

## 2013-11-04 ENCOUNTER — Encounter: Payer: Self-pay | Admitting: Family Medicine

## 2013-11-04 VITALS — BP 120/80 | Temp 98.3°F | Ht 70.75 in | Wt 285.0 lb

## 2013-11-04 DIAGNOSIS — R0789 Other chest pain: Secondary | ICD-10-CM | POA: Insufficient documentation

## 2013-11-04 DIAGNOSIS — L247 Irritant contact dermatitis due to plants, except food: Secondary | ICD-10-CM

## 2013-11-04 DIAGNOSIS — Z23 Encounter for immunization: Secondary | ICD-10-CM

## 2013-11-04 DIAGNOSIS — M109 Gout, unspecified: Secondary | ICD-10-CM

## 2013-11-04 DIAGNOSIS — M79671 Pain in right foot: Secondary | ICD-10-CM

## 2013-11-04 DIAGNOSIS — I10 Essential (primary) hypertension: Secondary | ICD-10-CM

## 2013-11-04 DIAGNOSIS — L255 Unspecified contact dermatitis due to plants, except food: Secondary | ICD-10-CM

## 2013-11-04 DIAGNOSIS — J309 Allergic rhinitis, unspecified: Secondary | ICD-10-CM

## 2013-11-04 DIAGNOSIS — M79609 Pain in unspecified limb: Secondary | ICD-10-CM

## 2013-11-04 DIAGNOSIS — E785 Hyperlipidemia, unspecified: Secondary | ICD-10-CM

## 2013-11-04 MED ORDER — FLUTICASONE PROPIONATE 50 MCG/ACT NA SUSP: NASAL | Status: DC

## 2013-11-04 MED ORDER — LOSARTAN POTASSIUM 100 MG PO TABS: ORAL_TABLET | ORAL | Status: DC

## 2013-11-04 MED ORDER — CARVEDILOL 3.125 MG PO TABS: ORAL_TABLET | ORAL | Status: DC

## 2013-11-04 MED ORDER — FLUOCINONIDE-E 0.05 % EX CREA: 1.0000 "application " | TOPICAL_CREAM | Freq: Two times a day (BID) | CUTANEOUS | Status: DC

## 2013-11-04 MED ORDER — AMLODIPINE BESYLATE 10 MG PO TABS: 10.0000 mg | ORAL_TABLET | Freq: Every day | ORAL | Status: DC

## 2013-11-04 MED ORDER — ALLOPURINOL 300 MG PO TABS: 300.0000 mg | ORAL_TABLET | Freq: Every day | ORAL | Status: DC

## 2013-11-04 MED ORDER — INDOMETHACIN ER 75 MG PO CPCR: ORAL_CAPSULE | ORAL | Status: DC

## 2013-11-04 MED ORDER — DOXAZOSIN MESYLATE 8 MG PO TABS: ORAL_TABLET | ORAL | Status: DC

## 2013-11-04 MED ORDER — SIMVASTATIN 40 MG PO TABS: 40.0000 mg | ORAL_TABLET | Freq: Every day | ORAL | Status: DC

## 2013-11-04 MED ORDER — LISINOPRIL 40 MG PO TABS: ORAL_TABLET | ORAL | Status: DC

## 2013-11-04 MED ORDER — FUROSEMIDE 80 MG PO TABS: ORAL_TABLET | ORAL | Status: DC

## 2013-11-04 NOTE — Progress Notes (Signed)
Subjective:    Patient ID: Bryan Haley, male    DOB: [birth year], 62 y.o.   MRN: [id number]  HPI Mr. hanning is a 62 year old married male X. smoker x1-1/2 weeks using the Chantix program who comes in today for general physical examination because of a history of type 1 diabetes, gout, hypertension, osteoarthritis, erectile dysfunction........ unresponsive to all the oral meds...... hyperlipidemia, and testosterone deficiency treated by Dr. Jill Side  He has a rash on his left arm that he got working out in the yard he's been using over-the-counter cortisone cream to no avail.  He is taking a half a Chantix twice a day and a spinal cigarettes now for a week and a half.  Exercise wise he can't exercise much because he 7 pain in his right foot. He seen 2 podiatrists with no help. 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 I advised him to see Dr. Toni Arthurs for consult  He says is having some lung pain,,,,,,,,,,, which he describes as a pressure sensation in the chest when he walks. It's been going on now for about a year it seems to be getting worse. He denies any true angina although because of his diabetes he may not have angina as a warning sign  He was given a Prevnar 13 to catch him up on his vaccinations  He continues to work 6 days a week   Review of Systems  Constitutional: Negative.   HENT: Negative.   Eyes: Negative.   Respiratory: Negative.   Cardiovascular: Negative.   Gastrointestinal: Negative.   Endocrine: Negative.   Genitourinary: Negative.   Musculoskeletal: Negative.   Skin: Negative.   Allergic/Immunologic: Negative.   Neurological: Negative.   Hematological: Negative.   Psychiatric/Behavioral: Negative.        Objective:   Physical Exam  Nursing note and vitals reviewed. Constitutional: He is oriented to person, place, and time. He appears well-developed and well-nourished.  HENT:  Head: Normocephalic and atraumatic.    Right Ear: External ear normal.  Left Ear: External ear normal.  Nose: Nose normal.  Mouth/Throat: Oropharynx is clear and moist.  Eyes: Conjunctivae and EOM are normal. Pupils are equal, round, and reactive to light.  Neck: Normal range of motion. Neck supple. No JVD present. No tracheal deviation present. No thyromegaly present.  Cardiovascular: Normal rate, regular rhythm, normal heart sounds and intact distal pulses.  Exam reveals no gallop and no friction rub.   No murmur heard. No carotid or bruits peripheral pulses 1+2 and symmetrical  Pulmonary/Chest: Effort normal and breath sounds normal. No stridor. No respiratory distress. He has no wheezes. He has no rales. He exhibits no tenderness.  Abdominal: Soft. Bowel sounds are normal. He exhibits no distension and no mass. There is no tenderness. There is no rebound and no guarding.  Genitourinary: Rectum normal, prostate normal and penis normal. Guaiac negative stool. No penile tenderness.  Musculoskeletal: Normal range of motion. He exhibits no edema and no tenderness.  Lymphadenopathy:    He has no cervical adenopathy.  Neurological: He is alert and oriented to person, place, and time. He has normal reflexes. No cranial nerve deficit. He exhibits normal muscle tone.  Skin: Skin is warm and dry. No rash noted. No erythema. No pallor.  Total body skin exam normal except for contact dermatitis left elbow  Psychiatric: He has a normal mood and affect. His behavior is normal. Judgment and thought content normal.  Assessment & Plan:  History of gout continue allopurinol  Hypertension at goal BP 120/80 continue current antihypertensive medications  Diabetes type 1 now followed by Dr. Jill Side........ he's gotten his A1c down to 7.5%  Hyper lipidemia continue Zocor  Pain right foot refer to Dr. Toni Arthurs  Eczema left arm Lidex gel  Atypical chest pain cardiac consult

## 2013-11-04 NOTE — Addendum Note (Signed)
Addended by: Kern Reap B on: 11/04/2013 05:32 PM   Modules accepted: Orders

## 2013-11-04 NOTE — Patient Instructions (Signed)
Call Dr. Toni Arthurs at [facility] to consult about your foot pain  We will get you set up a cardiology consult for further evaluation of the discomfort in your chest  Lidex gel,,,,,,,,,, small amounts 3 times daily until the rash goes away  Continue your other medications  Return in one year for general medical examination

## 2013-11-06 ENCOUNTER — Encounter: Payer: Self-pay | Admitting: Cardiology

## 2013-11-06 ENCOUNTER — Ambulatory Visit (INDEPENDENT_AMBULATORY_CARE_PROVIDER_SITE_OTHER): Payer: Managed Care, Other (non HMO) | Admitting: Cardiology

## 2013-11-06 VITALS — BP 142/70 | HR 68 | Ht 70.0 in | Wt 280.0 lb

## 2013-11-06 DIAGNOSIS — Z87891 Personal history of nicotine dependence: Secondary | ICD-10-CM

## 2013-11-06 DIAGNOSIS — I209 Angina pectoris, unspecified: Secondary | ICD-10-CM

## 2013-11-06 DIAGNOSIS — I208 Other forms of angina pectoris: Secondary | ICD-10-CM

## 2013-11-06 DIAGNOSIS — E785 Hyperlipidemia, unspecified: Secondary | ICD-10-CM

## 2013-11-06 DIAGNOSIS — E1065 Type 1 diabetes mellitus with hyperglycemia: Secondary | ICD-10-CM

## 2013-11-06 DIAGNOSIS — Z8249 Family history of ischemic heart disease and other diseases of the circulatory system: Secondary | ICD-10-CM | POA: Insufficient documentation

## 2013-11-06 NOTE — Patient Instructions (Signed)
Your physician recommends that you continue on your current medications as directed. Please refer to the Current Medication list given to you today.  Your physician has requested that you have en exercise stress myoview. For further information please visit [email or web address]. Please follow instruction sheet, as given.  Your physician wants you to follow-up in: 6 months with Dr. Skains You will receive a reminder letter in the mail two months in advance. If you don't receive a letter, please call our office to schedule the follow-up appointment.  

## 2013-11-06 NOTE — Progress Notes (Signed)
[address] [place], [place]  [address] Phone: [phone] Fax:  [phone]  Date:  11/06/2013   ID:  Shaheer, Bonfield [birth year], MRN [id number]  PCP:  Evette Georges, MD   History of Present Illness: Bryan Haley is a 62 y.o. male smoker with diabetes, hypertension, hyperlipidemia here for evaluation of chest pain. He previously described "lung pain" which was a pressure-like sensation in his chest wall when he was walking. It is been going on for about one year and seems to be escalating in severity. Feels like a burning. Walking will bring it on. More SOB.   Father had massive MI at 6.  Wt Readings from Last 3 Encounters:  11/06/13 280 lb (127.007 kg)  11/04/13 285 lb (129.275 kg)  10/10/13 280 lb (127.007 kg)     Past Medical History  Diagnosis Date  . Diabetic retinopathy of both eyes     Laser surgery, Dr. Luciana Axe  . DIABETES MELLITUS, TYPE I 09/02/2008  . DM W/COMPLICATION NOS, TYPE I 08/09/2007  . HYPOGONADISM 06/11/2010  . GOUT 07/11/2007  . HYPERTENSION 07/06/2007  . SLEEP APNEA 10/08/2009    wears CPAP "half the time"  . COLONIC POLYPS, HX OF 10/02/2008  . RENAL INSUFFICIENCY 12/11/2008  . Hyperlipidemia     Past Surgical History  Procedure Laterality Date  . Colonoscopy    . Carpal tunnel release      left    Current Outpatient Prescriptions  Medication Sig Dispense Refill  . allopurinol (ZYLOPRIM) 300 MG tablet Take 1 tablet (300 mg total) by mouth daily.  100 tablet  3  . amLODipine (NORVASC) 10 MG tablet Take 1 tablet (10 mg total) by mouth daily.  100 tablet  3  . aspirin 325 MG tablet Take 325 mg by mouth daily.        . carvedilol (COREG) 3.125 MG tablet One every morning  100 tablet  3  . CHANTIX CONTINUING MONTH PAK 1 MG tablet TAKE 1 TABLET (1 MG TOTAL) BY MOUTH DAILY.  56 tablet  1  . doxazosin (CARDURA) 8 MG tablet 1 each bedtime  100 tablet  3  . fluocinonide-emollient (LIDEX-E) 0.05 % cream Apply 1  application topically 2 (two) times daily.  30 g  0  . fluticasone (FLONASE) 50 MCG/ACT nasal spray 1 shot up each nostril at bedtime  16 g  6  . furosemide (LASIX) 80 MG tablet TAKE 1 TABLET TWICE A DAY  200 tablet  2  . indomethacin (INDOCIN SR) 75 MG CR capsule TAKE ONE CAPSULE TWICE A DAY WITH FOOD  200 capsule  1  . insulin aspart (NOVOLOG) 100 UNIT/ML injection 3 TIMES A DAY (JUST BEFORE EACH MEAL) 30-35-45 UNITS.      Marland Kitchen insulin NPH (HUMULIN N,NOVOLIN N) 100 UNIT/ML injection Inject 40 Units into the skin at bedtime.  20 mL  3  . lisinopril (PRINIVIL,ZESTRIL) 40 MG tablet 1 by mouth twice a day  200 tablet  3  . losartan (COZAAR) 100 MG tablet One tablet twice daily  200 tablet  3  . simvastatin (ZOCOR) 40 MG tablet Take 1 tablet (40 mg total) by mouth at bedtime.  100 tablet  3  . Testosterone (FORTESTA) 10 MG/ACT (2%) GEL Place 4 Squirts onto the skin daily.  60 g  5  . INS SYRINGE/NEEDLE 1CC/29G (B-D INSULIN SYRINGE) 29G X 1/2" 1 ML MISC Use as directed       .  Insulin Pen Needle (PEN NEEDLES 3/16") 31G X 5 MM MISC Inject 1 pen as directed 3 (three) times daily. Use as directed 3x daily  100 each  6  . ONE TOUCH ULTRA TEST test strip TEST TWICE A DAY  200 each  3   No current facility-administered medications for this visit.    Allergies:   No Known Allergies  Social History:  The patient  reports that he quit smoking about 3 years ago. His smoking use included Cigarettes. He smoked 0.00 packs per day. He has never used smokeless tobacco. He reports that he drinks alcohol. He reports that he does not use illicit drugs.   Family History  Problem Relation Age of Onset  . Cancer Neg Hx     No family history of colon cancer  . Colon cancer Neg Hx   . Esophageal cancer Neg Hx   . Stomach cancer Neg Hx   . Rectal cancer Neg Hx   . Diabetes Other   . Hyperlipidemia Other   . Hypertension Other   . Heart disease Father     ROS:  Please see the history of present illness.   Denies  any syncope, orthopnea, PND, rash, excessive joint pain, fevers, cough, weight loss.  All other systems reviewed and negative.   PHYSICAL EXAM: VS:  BP 142/70  Pulse 68  Ht 5\' 10"  (1.778 m)  Wt 280 lb (127.007 kg)  BMI 40.18 kg/m2 Well nourished, well developed, in no acute distress HEENT: normal, [place]/AT, EOMI Neck: no JVD, normal carotid upstroke, no bruit Cardiac:  normal S1, S2; RRR; no murmur Lungs:  clear to auscultation bilaterally, no wheezing, rhonchi or rales Abd: soft, nontender, no hepatomegaly, no bruitsObesity Ext: no edema, 2+ distal pulses Skin: warm and dry GU: deferred Neuro: no focal abnormalities noted, AAO x 3  EKG:  SR with first degree AVB 65 bpm.  ASSESSMENT AND PLAN:  1. Chest pain-concerning story for angina especially in light of his multitude of coronary risk factors including diabetes, coronary artery disease equivalent. I would like to start off with nuclear stress test to evaluate for any signs of ischemia. I will also see him back in 6 months to see if any symptoms have changed or escalated. If symptoms worsen or become more worrisome, further cardiac testing with coronary angiogram may be in order. 2. Diabetes-continue with aggressive risk factor prevention. He regularly sees eye MD. 3. Hyperlipidemia-continue with statin. Excellent. 4. Obesity--morbid obesity-BMI greater than 40. Strongly encourage exercise, weight loss. 5. Tobacco use-he has not smoked in greater than one week. Excellent. Continue to encourage. 6. Strong family history of early coronary artery disease-father died at age 58 from massive heart attack.  Signed, Donato Schultz, MD [facility]  11/06/2013 2:28 PM

## 2013-11-07 ENCOUNTER — Encounter: Payer: Self-pay | Admitting: Endocrinology

## 2013-11-07 ENCOUNTER — Ambulatory Visit (INDEPENDENT_AMBULATORY_CARE_PROVIDER_SITE_OTHER): Payer: Managed Care, Other (non HMO) | Admitting: Endocrinology

## 2013-11-07 VITALS — BP 120/70 | HR 62 | Temp 98.5°F | Ht 70.0 in | Wt 283.4 lb

## 2013-11-07 DIAGNOSIS — E1065 Type 1 diabetes mellitus with hyperglycemia: Secondary | ICD-10-CM

## 2013-11-07 MED ORDER — TESTOSTERONE 10 MG/ACT (2%) TD GEL: 5.0000 | Freq: Every day | TRANSDERMAL | Status: DC

## 2013-11-07 NOTE — Progress Notes (Signed)
Subjective:    Patient ID: Bryan Haley, male    DOB: December 04, 1950, 62 y.o.   MRN: [id number]  HPI Pt returns for f/u of insulin-requiring DM (dx'ed 2001, when he presented with fatigue; he has mild if any neuropathy of the lower extremities, but he has associated renal insufficiency; he has been on insulin since 2009; he has never had severe hypoglycemia or DKA).  pt states he will see cardiol soon.  no cbg record, but states cbg's are mildly low at hs, approx twice a month.  This is the lowest time of day.  It is highest in am, although he seldom eats at hs.  He feels the recent elev a1c is mostly accounted for by thanksgiving.   Past Medical History  Diagnosis Date  . Diabetic retinopathy of both eyes     Laser surgery, Dr. Luciana Axe  . DIABETES MELLITUS, TYPE I 09/02/2008  . DM W/COMPLICATION NOS, TYPE I 08/09/2007  . HYPOGONADISM 06/11/2010  . GOUT 07/11/2007  . HYPERTENSION 07/06/2007  . SLEEP APNEA 10/08/2009    wears CPAP "half the time"  . COLONIC POLYPS, HX OF 10/02/2008  . RENAL INSUFFICIENCY 12/11/2008  . Hyperlipidemia     Past Surgical History  Procedure Laterality Date  . Colonoscopy    . Carpal tunnel release      left    History   Social History  . Marital Status: Married    Spouse Name: N/A    Number of Children: N/A  . Years of Education: N/A   Occupational History  . sales    Social History Main Topics  . Smoking status: Former Smoker    Types: Cigarettes    Quit date: 10/02/2010  . Smokeless tobacco: Never Used  . Alcohol Use: Yes     Comment: 1 drink a week  . Drug Use: No  . Sexual Activity: Not on file   Other Topics Concern  . Not on file   Social History Narrative   Regular exercise-no   Pt went to seminar to quit smoking    Current Outpatient Prescriptions on File Prior to Visit  Medication Sig Dispense Refill  . allopurinol (ZYLOPRIM) 300 MG tablet Take 1 tablet (300 mg total) by mouth daily.  100 tablet  3  . amLODipine (NORVASC) 10  MG tablet Take 1 tablet (10 mg total) by mouth daily.  100 tablet  3  . aspirin 325 MG tablet Take 325 mg by mouth daily.        . carvedilol (COREG) 3.125 MG tablet One every morning  100 tablet  3  . CHANTIX CONTINUING MONTH PAK 1 MG tablet TAKE 1 TABLET (1 MG TOTAL) BY MOUTH DAILY.  56 tablet  1  . doxazosin (CARDURA) 8 MG tablet 1 each bedtime  100 tablet  3  . fluocinonide-emollient (LIDEX-E) 0.05 % cream Apply 1 application topically 2 (two) times daily.  30 g  0  . fluticasone (FLONASE) 50 MCG/ACT nasal spray 1 shot up each nostril at bedtime  16 g  6  . furosemide (LASIX) 80 MG tablet TAKE 1 TABLET TWICE A DAY  200 tablet  2  . indomethacin (INDOCIN SR) 75 MG CR capsule TAKE ONE CAPSULE TWICE A DAY WITH FOOD  200 capsule  1  . INS SYRINGE/NEEDLE 1CC/29G (B-D INSULIN SYRINGE) 29G X 1/2" 1 ML MISC Use as directed       . insulin aspart (NOVOLOG) 100 UNIT/ML injection 3 TIMES A DAY (JUST BEFORE  EACH MEAL) 30-35-35 UNITS.      Marland Kitchen Insulin Pen Needle (PEN NEEDLES 3/16") 31G X 5 MM MISC Inject 1 pen as directed 3 (three) times daily. Use as directed 3x daily  100 each  6  . lisinopril (PRINIVIL,ZESTRIL) 40 MG tablet 1 by mouth twice a day  200 tablet  3  . losartan (COZAAR) 100 MG tablet One tablet twice daily  200 tablet  3  . ONE TOUCH ULTRA TEST test strip TEST TWICE A DAY  200 each  3  . simvastatin (ZOCOR) 40 MG tablet Take 1 tablet (40 mg total) by mouth at bedtime.  100 tablet  3   No current facility-administered medications on file prior to visit.   No Known Allergies  Family History  Problem Relation Age of Onset  . Cancer Neg Hx     No family history of colon cancer  . Colon cancer Neg Hx   . Esophageal cancer Neg Hx   . Stomach cancer Neg Hx   . Rectal cancer Neg Hx   . Diabetes Other   . Hyperlipidemia Other   . Hypertension Other   . Heart disease Father    BP 120/70  Pulse 62  Temp(Src) 98.5 F (36.9 C) (Oral)  Ht 5\' 10"  (1.778 m)  Wt 283 lb 6 oz (128.538 kg)   BMI 40.66 kg/m2  SpO2 95%  Review of Systems Denies LOC and weight change.    Objective:   Physical Exam VITAL SIGNS:  See vs page GENERAL: no distress  Lab Results  Component Value Date   HGBA1C 8.3* 10/28/2013   Lab Results  Component Value Date   TESTOSTERONE 155.71* 07/08/2013      Assessment & Plan:  DM: he needs increased rx. This insulin regimen was chosen from multiple options, as it best matches his insulin to his changing requirements throughout the day.  The benefits of glycemic control must be weighed against the risks of hypoglycemia.   Renal insufficiency: this increases the risk of hypoglycemia. Hypogonadism: he requests increased rx.

## 2013-11-07 NOTE — Patient Instructions (Addendum)
Please come back for a follow-up appointment in 3 months check your blood sugar twice a day.  vary the time of day when you check, between before the 3 meals, and at bedtime.  also check if you have symptoms of your blood sugar being too high or too low.  please keep a record of the readings and bring it to your next appointment here.  You can write it on any piece of paper.  please call us sooner if your blood sugar goes below 70, or if you have a lot of readings over 200.  Please change the insulins to the numbers below.   Please increase the fortesa to 5 pumps per day.

## 2013-11-27 ENCOUNTER — Encounter: Payer: Self-pay | Admitting: Cardiology

## 2013-11-27 ENCOUNTER — Ambulatory Visit (HOSPITAL_COMMUNITY): Payer: Managed Care, Other (non HMO) | Attending: Cardiology | Admitting: Radiology

## 2013-11-27 VITALS — BP 136/62 | Ht 70.0 in | Wt 283.0 lb

## 2013-11-27 DIAGNOSIS — I209 Angina pectoris, unspecified: Secondary | ICD-10-CM | POA: Insufficient documentation

## 2013-11-27 DIAGNOSIS — I208 Other forms of angina pectoris: Secondary | ICD-10-CM

## 2013-11-27 DIAGNOSIS — E119 Type 2 diabetes mellitus without complications: Secondary | ICD-10-CM | POA: Insufficient documentation

## 2013-11-27 DIAGNOSIS — R002 Palpitations: Secondary | ICD-10-CM | POA: Insufficient documentation

## 2013-11-27 DIAGNOSIS — R079 Chest pain, unspecified: Secondary | ICD-10-CM | POA: Insufficient documentation

## 2013-11-27 DIAGNOSIS — I10 Essential (primary) hypertension: Secondary | ICD-10-CM | POA: Insufficient documentation

## 2013-11-27 DIAGNOSIS — R42 Dizziness and giddiness: Secondary | ICD-10-CM | POA: Insufficient documentation

## 2013-11-27 DIAGNOSIS — Z87891 Personal history of nicotine dependence: Secondary | ICD-10-CM | POA: Insufficient documentation

## 2013-11-27 DIAGNOSIS — R0602 Shortness of breath: Secondary | ICD-10-CM | POA: Insufficient documentation

## 2013-11-27 MED ORDER — TECHNETIUM TC 99M SESTAMIBI GENERIC - CARDIOLITE
10.0000 | Freq: Once | INTRAVENOUS | Status: AC | PRN
  Administered 2013-11-27: 10 via INTRAVENOUS

## 2013-11-27 MED ORDER — TECHNETIUM TC 99M SESTAMIBI GENERIC - CARDIOLITE
30.0000 | Freq: Once | INTRAVENOUS | Status: AC | PRN
  Administered 2013-11-27: 30 via INTRAVENOUS

## 2013-11-27 NOTE — Progress Notes (Signed)
[facility] SITE 3 NUCLEAR MED [address] [place], [place] [address] [phone]    Cardiology Nuclear Med Study  Bryan Haley is a 61 y.o. male     MRN : [id number]     DOB: October 03, 1951  Procedure Date: 11/27/2013  Nuclear Med Background Indication for Stress Test:  Evaluation for Ischemia History:  No Known History of CAD Cardiac Risk Factors: Family History - CAD, History of Smoking, Hypertension, IDDM, and Lipids  Symptoms:  Chest Pain, Dizziness, Palpitations and SOB   Nuclear Pre-Procedure Caffeine/Decaff Intake:  None > 12 hrs NPO After: 6:30pm   Lungs:  clear O2 Sat: 94% on room air. IV 0.9% NS with Angio Cath:  22g  IV Site: R Antecubital x 1, tolerated well IV Started by:  Irean Hong, RN  Chest Size (in):  56 Cup Size: n/a  Height: 5\' 10"  (1.778 m)  Weight:  283 lb (128.368 kg)  BMI:  Body mass index is 40.61 kg/(m^2). Tech Comments:  Held carvedilol x 24 hrs. 1/2 dose Humalin N last night,and no insulin today. Fasting CBG was 234 at 7:35 am. Irean Hong, RN.    Nuclear Med Study 1 or 2 day study: 1 day  Stress Test Type:  Stress  Reading MD: Donato Schultz, MD  Order Authorizing Provider:  Donato Schultz, MD  Resting Radionuclide: Technetium 52m Sestamibi  Resting Radionuclide Dose: 11.0 mCi   Stress Radionuclide:  Technetium 23m Sestamibi  Stress Radionuclide Dose: 33.0 mCi           Stress Protocol Rest HR: 69 Stress HR: 139  Rest BP: 136/62 Stress BP: 170/75  Exercise Time (min): 6:30 METS: 7.00   Predicted Max HR: 157 bpm % Max HR: 88.54 bpm Rate Pressure Product: 03474   Dose of Adenosine (mg):  n/a Dose of Lexiscan: n/a mg  Dose of Atropine (mg): n/a Dose of Dobutamine: n/a mcg/kg/min (at max HR)  Stress Test Technologist: Frederick Peers, EMT-P  Nuclear Technologist:  Domenic Polite, CNMT     Rest Procedure:  Myocardial perfusion imaging was performed at rest 45 minutes following the intravenous administration of Technetium 43m  Sestamibi. Rest ECG: NSR-LVH  Stress Procedure:  The patient exercised on the treadmill utilizing the Bruce Protocol for 6:30 minutes. The patient stopped due to sob and denied any chest pain.  Technetium 68m Sestamibi was injected at peak exercise and myocardial perfusion imaging was performed after a brief delay. Stress ECG: Significant ST abnormalities consistent with ischemia.  QPS Raw Data Images:  Mild diaphragmatic attenuation.  Normal left ventricular size. Stress Images:  There is decreased uptake in the inferior wall.  Decreased uptake in the lateral wall as well.  Rest Images:  Normal homogeneous uptake in all areas of the myocardium. Subtraction (SDS):  These findings are consistent with ischemia. Transient Ischemic Dilatation (Normal <1.22):  1.04 Lung/Heart Ratio (Normal <0.45):  0.39  Quantitative Gated Spect Images QGS EDV:  166 ml QGS ESV:  91 ml  Impression Exercise Capacity:  Good exercise capacity. BP Response:  Hypertensive blood pressure response. Clinical Symptoms:  Mild chest pain/dyspnea. ECG Impression:  Significant ST abnormalities consistent with ischemia. Comparison with Prior Nuclear Study: No images to compare  Overall Impression:  Intermediate risk stress nuclear study with moderate sized reverible, inferolateral defect..  LV Ejection Fraction: 45%.  LV Wall Motion:  Globally reduced LV systolic function of mild degree.  Corky Crafts., MD, [facility]

## 2013-12-02 ENCOUNTER — Encounter (HOSPITAL_COMMUNITY): Payer: Self-pay | Admitting: Respiratory Therapy

## 2013-12-02 ENCOUNTER — Other Ambulatory Visit: Payer: Self-pay | Admitting: Internal Medicine

## 2013-12-02 ENCOUNTER — Encounter: Payer: Self-pay | Admitting: Cardiology

## 2013-12-02 ENCOUNTER — Ambulatory Visit (INDEPENDENT_AMBULATORY_CARE_PROVIDER_SITE_OTHER): Payer: Managed Care, Other (non HMO) | Admitting: Cardiology

## 2013-12-02 ENCOUNTER — Encounter: Payer: Self-pay | Admitting: *Deleted

## 2013-12-02 ENCOUNTER — Telehealth: Payer: Self-pay | Admitting: Cardiology

## 2013-12-02 VITALS — BP 130/70 | HR 69 | Ht 70.0 in | Wt 281.0 lb

## 2013-12-02 DIAGNOSIS — I10 Essential (primary) hypertension: Secondary | ICD-10-CM

## 2013-12-02 DIAGNOSIS — E785 Hyperlipidemia, unspecified: Secondary | ICD-10-CM

## 2013-12-02 LAB — CBC WITH DIFFERENTIAL/PLATELET
BASOS ABS: 0 10*3/uL (ref 0.0–0.1)
Basophils Relative: 0.3 % (ref 0.0–3.0)
EOS ABS: 0.1 10*3/uL (ref 0.0–0.7)
Eosinophils Relative: 1 % (ref 0.0–5.0)
HEMATOCRIT: 41.3 % (ref 39.0–52.0)
Hemoglobin: 14 g/dL (ref 13.0–17.0)
LYMPHS ABS: 1.7 10*3/uL (ref 0.7–4.0)
Lymphocytes Relative: 20.6 % (ref 12.0–46.0)
MCHC: 33.9 g/dL (ref 30.0–36.0)
MCV: 93.7 fl (ref 78.0–100.0)
MONO ABS: 0.6 10*3/uL (ref 0.1–1.0)
Monocytes Relative: 7.2 % (ref 3.0–12.0)
NEUTROS PCT: 70.9 % (ref 43.0–77.0)
Neutro Abs: 6 10*3/uL (ref 1.4–7.7)
PLATELETS: 158 10*3/uL (ref 150.0–400.0)
RBC: 4.41 Mil/uL (ref 4.22–5.81)
RDW: 14.1 % (ref 11.5–14.6)
WBC: 8.4 10*3/uL (ref 4.5–10.5)

## 2013-12-02 LAB — BASIC METABOLIC PANEL
BUN: 25 mg/dL — ABNORMAL HIGH (ref 6–23)
CO2: 30 mEq/L (ref 19–32)
Calcium: 9.1 mg/dL (ref 8.4–10.5)
Chloride: 97 mEq/L (ref 96–112)
Creatinine, Ser: 1.5 mg/dL (ref 0.4–1.5)
GFR: 48.98 mL/min — ABNORMAL LOW (ref >60.00)
Glucose, Bld: 162 mg/dL — ABNORMAL HIGH (ref 70–99)
POTASSIUM: 3.4 meq/L — AB (ref 3.5–5.1)
SODIUM: 136 meq/L (ref 135–145)

## 2013-12-02 LAB — PROTIME-INR
INR: 1 ratio (ref 0.8–1.0)
PROTHROMBIN TIME: 11 s (ref 10.2–12.4)

## 2013-12-02 MED ORDER — ISOSORBIDE MONONITRATE ER 30 MG PO TB24: 30.0000 mg | ORAL_TABLET | Freq: Every day | ORAL | Status: DC

## 2013-12-02 NOTE — Progress Notes (Signed)
Pt will ne seen today to discuss cath

## 2013-12-02 NOTE — Patient Instructions (Signed)
Your physician has requested that you have a cardiac catheterization. Cardiac catheterization is used to diagnose and/or treat various heart conditions. Doctors may recommend this procedure for a number of different reasons. The most common reason is to evaluate chest pain. Chest pain can be a symptom of coronary artery disease (CAD), and cardiac catheterization can show whether plaque is narrowing or blocking your heart's arteries. This procedure is also used to evaluate the valves, as well as measure the blood flow and oxygen levels in different parts of your heart. For further information please visit [email or web address]. Please follow instruction sheet, as given.  Your physician recommends that you have  lab work today : CBC, INR,BMET  Your physician has recommended you make the following change in your medication:   1. Start Imdur 30 mg 1 tab daily   Your physician recommends that you schedule a follow-up appointment in: 2 weeks with DR. Honeywell

## 2013-12-02 NOTE — Telephone Encounter (Signed)
Spoke with pt there aware to hold insulin morning of Cath

## 2013-12-02 NOTE — Progress Notes (Signed)
     [address] [place], [place]  [address] Phone: [phone] Fax:  [phone]  Date:  12/02/2013   ID:  Clemente M Roskos, DOB 06/19/1951, MRN [id number]  PCP:  TODD,JEFFREY ALLEN, MD   History of Present Illness: Bryan Haley is a 63 y.o. male smoker with diabetes, hypertension, hyperlipidemia here for evaluation of chest pain. He previously described "lung pain" which was a pressure-like sensation in his chest wall when he was walking. It is been going on for about one year and seems to be escalating in severity. Feels like a burning. Walking will bring it on. More SOB.   Father had massive MI at 38.  Nuclear stress test 11/27/13:  Impression  Exercise Capacity: Good exercise capacity.  BP Response: Hypertensive blood pressure response.  Clinical Symptoms: Mild chest pain/dyspnea.  ECG Impression: Significant ST abnormalities consistent with ischemia.  Comparison with Prior Nuclear Study: No images to compare  Overall Impression: Intermediate risk stress nuclear study with moderate sized reverible, inferolateral defect..  LV Ejection Fraction: 45%. LV Wall Motion: Globally reduced LV systolic function of mild degree.    Wt Readings from Last 3 Encounters:  12/02/13 281 lb (127.461 kg)  11/27/13 283 lb (128.368 kg)  11/07/13 283 lb 6 oz (128.538 kg)     Past Medical History  Diagnosis Date  . Diabetic retinopathy of both eyes     Laser surgery, Dr. Rankin  . DIABETES MELLITUS, TYPE I 09/02/2008  . DM W/COMPLICATION NOS, TYPE I 08/09/2007  . HYPOGONADISM 06/11/2010  . GOUT 07/11/2007  . HYPERTENSION 07/06/2007  . SLEEP APNEA 10/08/2009    wears CPAP "half the time"  . COLONIC POLYPS, HX OF 10/02/2008  . RENAL INSUFFICIENCY 12/11/2008  . Hyperlipidemia     Past Surgical History  Procedure Laterality Date  . Colonoscopy    . Carpal tunnel release      left    Current Outpatient Prescriptions  Medication Sig Dispense Refill  . allopurinol  (ZYLOPRIM) 300 MG tablet Take 1 tablet (300 mg total) by mouth daily.  100 tablet  3  . amLODipine (NORVASC) 10 MG tablet Take 1 tablet (10 mg total) by mouth daily.  100 tablet  3  . aspirin 325 MG tablet Take 325 mg by mouth daily.        . carvedilol (COREG) 3.125 MG tablet One every morning  100 tablet  3  . CHANTIX CONTINUING MONTH PAK 1 MG tablet TAKE 1 TABLET (1 MG TOTAL) BY MOUTH DAILY.  56 tablet  1  . doxazosin (CARDURA) 8 MG tablet 1 each bedtime  100 tablet  3  . fluocinonide-emollient (LIDEX-E) 0.05 % cream Apply 1 application topically 2 (two) times daily.  30 g  0  . fluticasone (FLONASE) 50 MCG/ACT nasal spray 1 shot up each nostril at bedtime  16 g  6  . furosemide (LASIX) 80 MG tablet TAKE 1 TABLET TWICE A DAY  200 tablet  2  . indomethacin (INDOCIN SR) 75 MG CR capsule TAKE ONE CAPSULE TWICE A DAY WITH FOOD  200 capsule  1  . INS SYRINGE/NEEDLE 1CC/29G (B-D INSULIN SYRINGE) 29G X 1/2" 1 ML MISC Use as directed       . insulin aspart (NOVOLOG) 100 UNIT/ML injection 3 TIMES A DAY (JUST BEFORE EACH MEAL) 30-35-35 UNITS.      . insulin NPH (HUMULIN N,NOVOLIN N) 100 UNIT/ML injection Inject 45 Units into the skin at bedtime.      .   Insulin Pen Needle (PEN NEEDLES 3/16") 31G X 5 MM MISC Inject 1 pen as directed 3 (three) times daily. Use as directed 3x daily  100 each  6  . lisinopril (PRINIVIL,ZESTRIL) 40 MG tablet 1 by mouth twice a day  200 tablet  3  . losartan (COZAAR) 100 MG tablet One tablet twice daily  200 tablet  3  . ONE TOUCH ULTRA TEST test strip TEST TWICE A DAY  200 each  3  . simvastatin (ZOCOR) 40 MG tablet Take 1 tablet (40 mg total) by mouth at bedtime.  100 tablet  3  . Testosterone 10 MG/ACT (2%) GEL Place 5 Squirts onto the skin daily.  60 g  5   No current facility-administered medications for this visit.    Allergies:   No Known Allergies  Social History:  The patient  reports that he quit smoking about 3 years ago. His smoking use included Cigarettes. He  smoked 0.00 packs per day. He has never used smokeless tobacco. He reports that he drinks alcohol. He reports that he does not use illicit drugs.   Family History  Problem Relation Age of Onset  . Cancer Neg Hx     No family history of colon cancer  . Colon cancer Neg Hx   . Esophageal cancer Neg Hx   . Stomach cancer Neg Hx   . Rectal cancer Neg Hx   . Diabetes Other   . Hyperlipidemia Other   . Hypertension Other   . Heart disease Father     ROS:  Please see the history of present illness.   Denies any syncope, orthopnea, PND, rash, excessive joint pain, fevers, cough, weight loss.  All other systems reviewed and negative.   PHYSICAL EXAM: VS:  BP 130/70  Pulse 69  Ht 5\' 10"  (1.778 m)  Wt 281 lb (127.461 kg)  BMI 40.32 kg/m2 Well nourished, well developed, in no acute distress HEENT: normal, [place]/AT, EOMI Neck: no JVD, normal carotid upstroke, no bruit Cardiac:  normal S1, S2; RRR; no murmur Lungs:  clear to auscultation bilaterally, no wheezing, rhonchi or rales Abd: soft, nontender, no hepatomegaly, no bruitsObesity Ext: no edema, 2+ distal pulses, normal radial pulse Skin: warm and dry GU: deferred Neuro: no focal abnormalities noted, AAO x 3  EKG:  SR with first degree AVB 65 bpm.  ASSESSMENT AND PLAN:  1. Chest pain/angina-abnormal nuclear stress test, intermediate risk. Starting isosorbide. On Coreg, beta blocker. Continue with aspirin. Concerning story for angina especially in light of his multitude of coronary risk factors including diabetes, coronary artery disease equivalent. We are going to proceed with cardiac catheterization, risks and benefits including stroke, heart attack, death, bleeding have been discussed with patient. Radial artery approach. 2. Diabetes-continue with aggressive risk factor prevention. He regularly sees eye MD. 3. Hyperlipidemia-continue with statin. Excellent. 4. Obesity--morbid obesity-BMI greater than 40. Strongly encourage exercise,  weight loss. 5. Tobacco use-he has not smoked in greater than one week. Excellent. Continue to encourage. 64. Strong family history of early coronary artery disease-father died at age 10 from massive heart attack.  wife was present for discussion.  Signed, Candee Furbish, MD [facility]  12/02/2013 1:47 PM

## 2013-12-03 NOTE — H&P (View-Only) (Signed)
[facility] [address], [address], [place]  [address] Phone: [phone] Fax:  [phone]  Date:  12/02/2013   ID:  Bryan Haley, DOB 05/28/1951, MRN [id number]  PCP:  [name], MD   History of Present Illness: Bryan Haley is a 63 y.o. male smoker with diabetes, hypertension, hyperlipidemia here for evaluation of chest pain. He previously described "lung pain" which was a pressure-like sensation in his chest wall when he was walking. It is been going on for about one year and seems to be escalating in severity. Feels like a burning. Walking will bring it on. More SOB.   Father had massive MI at 10.  Nuclear stress test 11/27/13:  Impression  Exercise Capacity: Good exercise capacity.  BP Response: Hypertensive blood pressure response.  Clinical Symptoms: Mild chest pain/dyspnea.  ECG Impression: Significant ST abnormalities consistent with ischemia.  Comparison with Prior Nuclear Study: No images to compare  Overall Impression: Intermediate risk stress nuclear study with moderate sized reverible, inferolateral defect..  LV Ejection Fraction: 45%. LV Wall Motion: Globally reduced LV systolic function of mild degree.    Wt Readings from Last 3 Encounters:  12/02/13 281 lb (127.461 kg)  11/27/13 283 lb (128.368 kg)  11/07/13 283 lb 6 oz (128.538 kg)     Past Medical History  Diagnosis Date  . Diabetic retinopathy of both eyes     Laser surgery, Dr. Zadie Rhine  . DIABETES MELLITUS, TYPE I 09/02/2008  . DM W/COMPLICATION NOS, TYPE I 08/09/2007  . HYPOGONADISM 06/11/2010  . GOUT 07/11/2007  . HYPERTENSION 07/06/2007  . SLEEP APNEA 10/08/2009    wears CPAP "half the time"  . COLONIC POLYPS, HX OF 10/02/2008  . RENAL INSUFFICIENCY 12/11/2008  . Hyperlipidemia     Past Surgical History  Procedure Laterality Date  . Colonoscopy    . Carpal tunnel release      left    Current Outpatient Prescriptions  Medication Sig Dispense Refill  . allopurinol  (ZYLOPRIM) 300 MG tablet Take 1 tablet (300 mg total) by mouth daily.  100 tablet  3  . amLODipine (NORVASC) 10 MG tablet Take 1 tablet (10 mg total) by mouth daily.  100 tablet  3  . aspirin 325 MG tablet Take 325 mg by mouth daily.        . carvedilol (COREG) 3.125 MG tablet One every morning  100 tablet  3  . CHANTIX CONTINUING MONTH PAK 1 MG tablet TAKE 1 TABLET (1 MG TOTAL) BY MOUTH DAILY.  56 tablet  1  . doxazosin (CARDURA) 8 MG tablet 1 each bedtime  100 tablet  3  . fluocinonide-emollient (LIDEX-E) 0.05 % cream Apply 1 application topically 2 (two) times daily.  30 g  0  . fluticasone (FLONASE) 50 MCG/ACT nasal spray 1 shot up each nostril at bedtime  16 g  6  . furosemide (LASIX) 80 MG tablet TAKE 1 TABLET TWICE A DAY  200 tablet  2  . indomethacin (INDOCIN SR) 75 MG CR capsule TAKE ONE CAPSULE TWICE A DAY WITH FOOD  200 capsule  1  . INS SYRINGE/NEEDLE 1CC/29G (B-D INSULIN SYRINGE) 29G X 1/2" 1 ML MISC Use as directed       . insulin aspart (NOVOLOG) 100 UNIT/ML injection 3 TIMES A DAY (JUST BEFORE EACH MEAL) 30-35-35 UNITS.      Marland Kitchen insulin NPH (HUMULIN N,NOVOLIN N) 100 UNIT/ML injection Inject 45 Units into the skin at bedtime.      Marland Kitchen  Insulin Pen Needle (PEN NEEDLES 3/16") 31G X 5 MM MISC Inject 1 pen as directed 3 (three) times daily. Use as directed 3x daily  100 each  6  . lisinopril (PRINIVIL,ZESTRIL) 40 MG tablet 1 by mouth twice a day  200 tablet  3  . losartan (COZAAR) 100 MG tablet One tablet twice daily  200 tablet  3  . ONE TOUCH ULTRA TEST test strip TEST TWICE A DAY  200 each  3  . simvastatin (ZOCOR) 40 MG tablet Take 1 tablet (40 mg total) by mouth at bedtime.  100 tablet  3  . Testosterone 10 MG/ACT (2%) GEL Place 5 Squirts onto the skin daily.  60 g  5   No current facility-administered medications for this visit.    Allergies:   No Known Allergies  Social History:  The patient  reports that he quit smoking about 3 years ago. His smoking use included Cigarettes. He  smoked 0.00 packs per day. He has never used smokeless tobacco. He reports that he drinks alcohol. He reports that he does not use illicit drugs.   Family History  Problem Relation Age of Onset  . Cancer Neg Hx     No family history of colon cancer  . Colon cancer Neg Hx   . Esophageal cancer Neg Hx   . Stomach cancer Neg Hx   . Rectal cancer Neg Hx   . Diabetes Other   . Hyperlipidemia Other   . Hypertension Other   . Heart disease Father     ROS:  Please see the history of present illness.   Denies any syncope, orthopnea, PND, rash, excessive joint pain, fevers, cough, weight loss.  All other systems reviewed and negative.   PHYSICAL EXAM: VS:  BP 130/70  Pulse 69  Ht 5\' 10"  (1.778 m)  Wt 281 lb (127.461 kg)  BMI 40.32 kg/m2 Well nourished, well developed, in no acute distress HEENT: normal, [place]/AT, EOMI Neck: no JVD, normal carotid upstroke, no bruit Cardiac:  normal S1, S2; RRR; no murmur Lungs:  clear to auscultation bilaterally, no wheezing, rhonchi or rales Abd: soft, nontender, no hepatomegaly, no bruitsObesity Ext: no edema, 2+ distal pulses, normal radial pulse Skin: warm and dry GU: deferred Neuro: no focal abnormalities noted, AAO x 3  EKG:  SR with first degree AVB 65 bpm.  ASSESSMENT AND PLAN:  1. Chest pain/angina-abnormal nuclear stress test, intermediate risk. Starting isosorbide. On Coreg, beta blocker. Continue with aspirin. Concerning story for angina especially in light of his multitude of coronary risk factors including diabetes, coronary artery disease equivalent. We are going to proceed with cardiac catheterization, risks and benefits including stroke, heart attack, death, bleeding have been discussed with patient. Radial artery approach. 2. Diabetes-continue with aggressive risk factor prevention. He regularly sees eye MD. 3. Hyperlipidemia-continue with statin. Excellent. 4. Obesity--morbid obesity-BMI greater than 40. Strongly encourage exercise,  weight loss. 5. Tobacco use-he has not smoked in greater than one week. Excellent. Continue to encourage. 64. Strong family history of early coronary artery disease-father died at age 10 from massive heart attack.  wife was present for discussion.  Signed, Candee Furbish, MD [facility]  12/02/2013 1:47 PM

## 2013-12-03 NOTE — Interval H&P Note (Signed)
History and Physical Interval Note:  12/03/2013 10:31 PM  Bryan Haley  has presented today for surgery, with the diagnosis of abn stress   [name]  The various methods of treatment have been discussed with the patient and family. After consideration of risks, benefits and other options for treatment, the patient has consented to  Procedure(s): LEFT HEART CATHETERIZATION WITH CORONARY ANGIOGRAM (Bilateral) as a surgical intervention .  The patient's history has been reviewed, patient examined, no change in status, stable for surgery.  I have reviewed the patient's chart and labs.  Questions were answered to the patient's satisfaction.     SKAINS, MARK

## 2013-12-04 ENCOUNTER — Encounter (HOSPITAL_COMMUNITY)
Admission: RE | Disposition: A | Discharge status: Home / Self Care | Payer: Self-pay | Source: Ambulatory Visit | Attending: Cardiology

## 2013-12-04 ENCOUNTER — Ambulatory Visit (HOSPITAL_COMMUNITY)
Admission: RE | Admit: 2013-12-04 | Discharge: 2013-12-05 | Disposition: A | Discharge status: Home / Self Care | Payer: Managed Care, Other (non HMO) | Source: Ambulatory Visit | Attending: Cardiology | Admitting: Cardiology

## 2013-12-04 ENCOUNTER — Encounter (HOSPITAL_COMMUNITY): Payer: Self-pay | Admitting: Interventional Cardiology

## 2013-12-04 DIAGNOSIS — Z7982 Long term (current) use of aspirin: Secondary | ICD-10-CM | POA: Insufficient documentation

## 2013-12-04 DIAGNOSIS — I10 Essential (primary) hypertension: Secondary | ICD-10-CM | POA: Insufficient documentation

## 2013-12-04 DIAGNOSIS — E1065 Type 1 diabetes mellitus with hyperglycemia: Secondary | ICD-10-CM

## 2013-12-04 DIAGNOSIS — R9430 Abnormal result of cardiovascular function study, unspecified: Secondary | ICD-10-CM | POA: Diagnosis present

## 2013-12-04 DIAGNOSIS — Z6841 Body Mass Index (BMI) 40.0 and over, adult: Secondary | ICD-10-CM | POA: Insufficient documentation

## 2013-12-04 DIAGNOSIS — Z8249 Family history of ischemic heart disease and other diseases of the circulatory system: Secondary | ICD-10-CM | POA: Insufficient documentation

## 2013-12-04 DIAGNOSIS — I2510 Atherosclerotic heart disease of native coronary artery without angina pectoris: Secondary | ICD-10-CM | POA: Insufficient documentation

## 2013-12-04 DIAGNOSIS — R9439 Abnormal result of other cardiovascular function study: Secondary | ICD-10-CM | POA: Diagnosis present

## 2013-12-04 DIAGNOSIS — E11319 Type 2 diabetes mellitus with unspecified diabetic retinopathy without macular edema: Secondary | ICD-10-CM | POA: Insufficient documentation

## 2013-12-04 DIAGNOSIS — E1039 Type 1 diabetes mellitus with other diabetic ophthalmic complication: Secondary | ICD-10-CM | POA: Insufficient documentation

## 2013-12-04 DIAGNOSIS — I209 Angina pectoris, unspecified: Secondary | ICD-10-CM | POA: Insufficient documentation

## 2013-12-04 DIAGNOSIS — IMO0002 Reserved for concepts with insufficient information to code with codable children: Secondary | ICD-10-CM

## 2013-12-04 DIAGNOSIS — Z794 Long term (current) use of insulin: Secondary | ICD-10-CM | POA: Insufficient documentation

## 2013-12-04 DIAGNOSIS — E785 Hyperlipidemia, unspecified: Secondary | ICD-10-CM | POA: Insufficient documentation

## 2013-12-04 DIAGNOSIS — I200 Unstable angina: Secondary | ICD-10-CM | POA: Insufficient documentation

## 2013-12-04 HISTORY — DX: Obstructive sleep apnea (adult) (pediatric): G47.33

## 2013-12-04 HISTORY — PX: CORONARY ANGIOPLASTY WITH STENT PLACEMENT: SHX49

## 2013-12-04 HISTORY — DX: Angina pectoris, unspecified: I20.9

## 2013-12-04 HISTORY — DX: Atherosclerotic heart disease of native coronary artery without angina pectoris: I25.10

## 2013-12-04 HISTORY — PX: LEFT HEART CATHETERIZATION WITH CORONARY ANGIOGRAM: SHX5451

## 2013-12-04 HISTORY — DX: Shortness of breath: R06.02

## 2013-12-04 LAB — GLUCOSE, CAPILLARY
GLUCOSE-CAPILLARY: 224 mg/dL — AB (ref 70–99)
GLUCOSE-CAPILLARY: 194 mg/dL — AB (ref 70–99)
GLUCOSE-CAPILLARY: 110 mg/dL — AB (ref 70–99)
Glucose-Capillary: 184 mg/dL — ABNORMAL HIGH (ref 70–99)

## 2013-12-04 LAB — BASIC METABOLIC PANEL
BUN: 29 mg/dL — ABNORMAL HIGH (ref 6–23)
CHLORIDE: 97 meq/L (ref 96–112)
CO2: 27 meq/L (ref 19–32)
CREATININE: 1.68 mg/dL — AB (ref 0.50–1.35)
Calcium: 9.4 mg/dL (ref 8.4–10.5)
GFR calc Af Amer: 48 mL/min — ABNORMAL LOW (ref >90)
GFR calc non Af Amer: 42 mL/min — ABNORMAL LOW (ref >90)
Glucose, Bld: 234 mg/dL — ABNORMAL HIGH (ref 70–99)
Potassium: 4.4 mEq/L (ref 3.7–5.3)
SODIUM: 137 meq/L (ref 137–147)

## 2013-12-04 LAB — POCT ACTIVATED CLOTTING TIME: ACTIVATED CLOTTING TIME: 387 s

## 2013-12-04 SURGERY — LEFT HEART CATHETERIZATION WITH CORONARY ANGIOGRAM
Anesthesia: Moderate Sedation | Laterality: Bilateral

## 2013-12-04 MED ORDER — ALLOPURINOL 300 MG PO TABS
300.0000 mg | ORAL_TABLET | Freq: Every day | ORAL | Status: DC
  Administered 2013-12-05: 10:00:00 300 mg via ORAL
  Filled 2013-12-04: qty 1

## 2013-12-04 MED ORDER — AMLODIPINE BESYLATE 10 MG PO TABS
10.0000 mg | ORAL_TABLET | Freq: Every day | ORAL | Status: DC
  Administered 2013-12-05: 10:00:00 10 mg via ORAL
  Filled 2013-12-04: qty 1

## 2013-12-04 MED ORDER — INSULIN ASPART 100 UNIT/ML ~~LOC~~ SOLN
30.0000 [IU] | Freq: Every day | SUBCUTANEOUS | Status: DC
  Administered 2013-12-05: 30 [IU] via SUBCUTANEOUS

## 2013-12-04 MED ORDER — ACETAMINOPHEN 325 MG PO TABS
650.0000 mg | ORAL_TABLET | ORAL | Status: DC | PRN
Start: 2013-12-04 — End: 2013-12-05

## 2013-12-04 MED ORDER — SODIUM CHLORIDE 0.9 % IV SOLN
INTRAVENOUS | Status: DC
  Administered 2013-12-04: 1000 mL via INTRAVENOUS

## 2013-12-04 MED ORDER — VERAPAMIL HCL 2.5 MG/ML IV SOLN
INTRAVENOUS | Status: AC
  Filled 2013-12-04: qty 2

## 2013-12-04 MED ORDER — ISOSORBIDE MONONITRATE ER 30 MG PO TB24
30.0000 mg | ORAL_TABLET | Freq: Every day | ORAL | Status: DC
  Administered 2013-12-04 – 2013-12-05 (×2): 30 mg via ORAL
  Filled 2013-12-04 (×2): qty 1

## 2013-12-04 MED ORDER — HEPARIN (PORCINE) IN NACL 2-0.9 UNIT/ML-% IJ SOLN
INTRAMUSCULAR | Status: AC
  Filled 2013-12-04: qty 1000

## 2013-12-04 MED ORDER — HEPARIN SODIUM (PORCINE) 1000 UNIT/ML IJ SOLN
INTRAMUSCULAR | Status: AC
  Filled 2013-12-04: qty 1

## 2013-12-04 MED ORDER — SODIUM CHLORIDE 0.9 % IV SOLN
1.7500 mg/kg/h | INTRAVENOUS | Status: AC
  Filled 2013-12-04: qty 250

## 2013-12-04 MED ORDER — VARENICLINE TARTRATE 1 MG PO TABS
1.0000 mg | ORAL_TABLET | Freq: Every day | ORAL | Status: DC
  Filled 2013-12-04 (×2): qty 1

## 2013-12-04 MED ORDER — INSULIN ASPART 100 UNIT/ML ~~LOC~~ SOLN: 30.0000 [IU] | Freq: Three times a day (TID) | SUBCUTANEOUS | Status: DC

## 2013-12-04 MED ORDER — SODIUM CHLORIDE 0.9 % IV BOLUS (SEPSIS)
250.0000 mL | Freq: Once | INTRAVENOUS | Status: AC
  Administered 2013-12-04: 08:00:00 via INTRAVENOUS

## 2013-12-04 MED ORDER — BIVALIRUDIN 250 MG IV SOLR
INTRAVENOUS | Status: AC
  Filled 2013-12-04: qty 250

## 2013-12-04 MED ORDER — CLOPIDOGREL BISULFATE 75 MG PO TABS
75.0000 mg | ORAL_TABLET | Freq: Every day | ORAL | Status: DC
  Administered 2013-12-05: 10:00:00 75 mg via ORAL
  Filled 2013-12-04: qty 1

## 2013-12-04 MED ORDER — LIDOCAINE HCL (PF) 1 % IJ SOLN
INTRAMUSCULAR | Status: AC
  Filled 2013-12-04: qty 30

## 2013-12-04 MED ORDER — DOXAZOSIN MESYLATE 8 MG PO TABS
8.0000 mg | ORAL_TABLET | Freq: Every day | ORAL | Status: DC
  Administered 2013-12-04: 8 mg via ORAL
  Filled 2013-12-04 (×2): qty 1

## 2013-12-04 MED ORDER — SODIUM CHLORIDE 0.9 % IJ SOLN: 3.0000 mL | INTRAMUSCULAR | Status: DC | PRN

## 2013-12-04 MED ORDER — NITROGLYCERIN 0.2 MG/ML ON CALL CATH LAB
INTRAVENOUS | Status: AC
  Filled 2013-12-04: qty 1

## 2013-12-04 MED ORDER — ASPIRIN 81 MG PO CHEW
81.0000 mg | CHEWABLE_TABLET | Freq: Every day | ORAL | Status: DC
  Administered 2013-12-05: 81 mg via ORAL
  Filled 2013-12-04: qty 1

## 2013-12-04 MED ORDER — SODIUM CHLORIDE 0.9 % IV SOLN: 1.0000 mL/kg/h | INTRAVENOUS | Status: AC

## 2013-12-04 MED ORDER — MIDAZOLAM HCL 2 MG/2ML IJ SOLN
INTRAMUSCULAR | Status: AC
  Filled 2013-12-04: qty 2

## 2013-12-04 MED ORDER — FENTANYL CITRATE 0.05 MG/ML IJ SOLN
INTRAMUSCULAR | Status: AC
  Filled 2013-12-04: qty 2

## 2013-12-04 MED ORDER — TICAGRELOR 90 MG PO TABS
ORAL_TABLET | ORAL | Status: AC
  Filled 2013-12-04: qty 1

## 2013-12-04 MED ORDER — SODIUM CHLORIDE 0.9 % IV SOLN: 250.0000 mL | INTRAVENOUS | Status: DC | PRN

## 2013-12-04 MED ORDER — SODIUM CHLORIDE 0.9 % IJ SOLN: 3.0000 mL | Freq: Two times a day (BID) | INTRAMUSCULAR | Status: DC

## 2013-12-04 MED ORDER — INSULIN NPH (HUMAN) (ISOPHANE) 100 UNIT/ML ~~LOC~~ SUSP
45.0000 [IU] | Freq: Every day | SUBCUTANEOUS | Status: DC
  Administered 2013-12-04: 22:00:00 45 [IU] via SUBCUTANEOUS
  Filled 2013-12-04: qty 10

## 2013-12-04 MED ORDER — FLUTICASONE PROPIONATE 50 MCG/ACT NA SUSP
1.0000 | Freq: Every day | NASAL | Status: DC
  Administered 2013-12-04: 22:00:00 1 via NASAL
  Filled 2013-12-04: qty 16

## 2013-12-04 MED ORDER — CARVEDILOL 3.125 MG PO TABS
3.1250 mg | ORAL_TABLET | Freq: Every day | ORAL | Status: DC
  Administered 2013-12-05: 3.125 mg via ORAL
  Filled 2013-12-04: qty 1

## 2013-12-04 MED ORDER — SODIUM CHLORIDE 0.9 % IV SOLN
0.2500 mg/kg/h | INTRAVENOUS | Status: DC
  Filled 2013-12-04: qty 250

## 2013-12-04 MED ORDER — LISINOPRIL 40 MG PO TABS
40.0000 mg | ORAL_TABLET | Freq: Two times a day (BID) | ORAL | Status: DC
  Administered 2013-12-04 – 2013-12-05 (×2): 40 mg via ORAL
  Filled 2013-12-04 (×3): qty 1

## 2013-12-04 MED ORDER — INSULIN ASPART 100 UNIT/ML ~~LOC~~ SOLN
35.0000 [IU] | Freq: Two times a day (BID) | SUBCUTANEOUS | Status: DC
  Administered 2013-12-04: 18:00:00 35 [IU] via SUBCUTANEOUS

## 2013-12-04 MED ORDER — SIMVASTATIN 40 MG PO TABS
40.0000 mg | ORAL_TABLET | Freq: Every day | ORAL | Status: DC
  Administered 2013-12-04: 40 mg via ORAL
  Filled 2013-12-04 (×2): qty 1

## 2013-12-04 MED ORDER — ONDANSETRON HCL 4 MG/2ML IJ SOLN: 4.0000 mg | Freq: Four times a day (QID) | INTRAMUSCULAR | Status: DC | PRN

## 2013-12-04 MED ORDER — ASPIRIN 81 MG PO CHEW: 81.0000 mg | CHEWABLE_TABLET | ORAL | Status: DC

## 2013-12-04 NOTE — CV Procedure (Signed)
CARDIAC CATHETERIZATION  PROCEDURE:  Left heart catheterization with selective coronary angiography, left ventriculogram via the radial artery approach.  INDICATIONS:  63 year old male with abnormal nuclear stress test, intermediate risk demonstrating inferolateral ischemia with ejection fraction of 47%, exertional anginal symptoms with minimal exertion on both beta blocker as well as isosorbide currently.  The risks, benefits, and details of the procedure were explained to the patient, including possibilities of stroke, heart attack, death, renal impairment, arterial damage, bleeding.  The patient verbalized understanding and wanted to proceed.  Informed written consent was obtained.  PROCEDURE TECHNIQUE:  Allen's test was performed pre-and post procedure and was normal. The right radial artery site was prepped and draped in a sterile fashion. One percent lidocaine was used for local anesthesia. Using the modified Seldinger technique a 5 French hydrophilic sheath was inserted into the radial artery without difficulty. Ultrasound was utilized to obtain access on the right radial artery. 3 mg of verapamil was administered via the sheath. When trying to advance the Judkins right catheter, we noted extreme tortuosity of the right sided subclavian system. There was no ability to torque the right catheter. Therefore, the left wrist was prepped and we went via the left radial artery. He tolerated this procedure well. A Judkins right #4 catheter with the guidance of a Versicore wire was placed in the right coronary cusp and selectively cannulated the right coronary artery. After traversing the aortic arch, 5000 units of heparin IV was administered initially and then 2000 units were administered once again after left radial approach was successful. A Judkins left #4 catheter was used to selectively cannulate the left main artery. Multiple views with hand injection of Omnipaque were obtained. Catheter a pigtail  catheter was used to cross into the left ventricle, hemodynamics were obtained, no ventriculogram was performed. Following the procedure, sheath was removed, patient was hemodynamically stable, hemostasis was maintained with a Terumo T band. The right radial T band was utilized throughout the left radial approach. Intervention took place.   CONTRAST:  Total of 60 ml.    FLOUROSCOPY TIME: 10.5 min.  COMPLICATIONS:  None.    HEMODYNAMICS:  Aortic pressure was 510/25 mmHg; LV systolic pressure was 852 mmHg; LVEDP 25 mmHg.  There was no gradient between the left ventricle and aorta.    ANGIOGRAPHIC DATA:    Left main: Minor luminal irregularities of the left main artery distally, branches into LAD and circumflex.  Left anterior descending (LAD): There is an occluded diagonal branch. This is being filled via right to left collateralization. There are minor luminal irregularities otherwise throughout the LAD approximately 30-40% in the midsegment. Appears non-flow-limiting.  Circumflex artery (CIRC): Small caliber vessel with 3 obtuse marginal branches. The second obtuse marginal branch is very small in caliber and significantly diseased in its proximal segment of up to 80%. The distal AV groove circumflex also has disease of approximate 70-80%. Fairly small caliber vessel.  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 The distal RCA and midsegment have diffuse mild luminal irregularities. At the bifurcation of the posterior lateral segment there is significant disease of approximately 80%. There is also disease of the posterior descending artery in its proximal segment of approximate 70%. There is collateral blood flow is seen traversing to the diagonal branch that is occluded in the left system.  LEFT VENTRICULOGRAM:  This was not performed. Ejection fraction was  47% on  nuclear stress test. Dye sparing procedure. Creatinine has ranged from 1.5-1.68.   IMPRESSIONS:  Significant, diffuse RCA disease, proximal shelf of calcium/spontaneous dissection, PDA bifurcation with PL also significantly diseased as described above. Mildly reduced left ventricular systolic function based upon nuclear stress of 47% ejection fraction.  LVEDP 25 mmHg, elevated.    RECOMMENDATION:  Proceeding with percutaneous interventions to the right coronary artery. Dr. Irish Lack. Discussed with patient, risks and benefits.

## 2013-12-04 NOTE — Progress Notes (Signed)
TR BAND REMOVAL  LOCATION:    right radial  DEFLATED PER PROTOCOL:    yes  TIME BAND OFF / DRESSING APPLIED:    1500   SITE UPON ARRIVAL:    Level 0  SITE AFTER BAND REMOVAL:    Level 0  REVERSE ALLEN'S TEST:     positive  CIRCULATION SENSATION AND MOVEMENT:    Within Normal Limits   yes  COMMENTS:   Tolerated procedure well 

## 2013-12-04 NOTE — Interval H&P Note (Signed)
Cath Lab Visit (complete for each Cath Lab visit)  Clinical Evaluation Leading to the Procedure:   ACS: no  Non-ACS:    Anginal Classification: CCS III  Anti-ischemic medical therapy: Maximal Therapy (2 or more classes of medications)  Non-Invasive Test Results: Intermediate-risk stress test findings: cardiac mortality 1-3%/year  Prior CABG: No previous CABG       History and Physical Interval Note:  12/04/2013 8:48 AM  Bryan Haley  has presented today for surgery, with the diagnosis of abn stress   [name]  The various methods of treatment have been discussed with the patient and family. After consideration of risks, benefits and other options for treatment, the patient has consented to  Procedure(s): LEFT HEART CATHETERIZATION WITH CORONARY ANGIOGRAM (Bilateral) as a surgical intervention .  The patient's history has been reviewed, patient examined, no change in status, stable for surgery.  I have reviewed the patient's chart and labs.  Questions were answered to the patient's satisfaction.     Wolf Boulay

## 2013-12-04 NOTE — Progress Notes (Signed)
TR BAND REMOVAL  LOCATION:    left radial  DEFLATED PER PROTOCOL:    yes  TIME BAND OFF / DRESSING APPLIED:    1500   SITE UPON ARRIVAL:    Level 0  SITE AFTER BAND REMOVAL:    Level 0  REVERSE ALLEN'S TEST:     positive  CIRCULATION SENSATION AND MOVEMENT:    Within Normal Limits   yes  COMMENTS:   Tolerated procedure well

## 2013-12-04 NOTE — CV Procedure (Signed)
       PROCEDURE:  PCI RCA, PCI PLA  INDICATIONS:  Abnormal stress test; class III angina  The risks, benefits, and details of the procedure were explained to the patient.  The patient verbalized understanding and wanted to proceed.  Informed written consent was obtained.  PROCEDURE TECHNIQUE:  The diagnostic cath was performed by Dr. Marlou Porch.  This revealed a proximal, 80% stenosis looked like a spontaneous dissection. There is a 90% stenosis in the proximal posterior lateral artery.   Via the left radial artery, Right coronary angiography was done using a Judkins R4 guide catheter.  Angiomax was used for anticoagulation. A pro-water wire was placed across the areas of disease in the RCA and posterior lateral artery. A 2.5 x 15 balloon was used to predilate the proximal posterior lateral artery.  A 2.75 x 23 alpine drug-eluting stent was then deployed in the proximal posterolateral artery.  The stent was post dilated with a 3.0 x 15 noncompliant balloon. There is an excellent angiographic result. Intracoronary nitroglycerin was administered.  Attention was then turned to the proximal lesion. A 3.5 x 18 alpine drug-eluting stent was deployed. The very proximal vessel was significantly larger than the distal portion of the stent. A 4.0 x 12 balloon was used to post dilate the proximal portion of the stent. It was inflated to 22 atmospheres. There is an excellent angiographic result with no residual stenosis. There is no residual dissection flap. A TR band was used for hemostasis.   CONTRAST:  Total of 75 cc, for the intervention.  COMPLICATIONS:  None.       ANGIOGRAPHIC DATA:     The right coronary artery is a large dominant vessel with a proximal 80% stenosis with spontaneous dissection and a 90% posterolateral artery stenosis.    IMPRESSIONS:  1.   Successful PCI of the proximal posterior lateral artery with a 2.75 x 23 Alpine drug-eluting stent, post dilated to greater than 3 mm in  diameter.   2.  Successful PCI of the proximal right coronary artery with a 3.5 x 18 alpine drug-eluting stent, postdilated to 4.3 mm in diameter.   RECOMMENDATION:  Continue dual antiplatelet therapy for at least a year. Continue aggressive secondary prevention.

## 2013-12-05 DIAGNOSIS — I2510 Atherosclerotic heart disease of native coronary artery without angina pectoris: Secondary | ICD-10-CM

## 2013-12-05 DIAGNOSIS — I200 Unstable angina: Secondary | ICD-10-CM | POA: Diagnosis present

## 2013-12-05 HISTORY — DX: Atherosclerotic heart disease of native coronary artery without angina pectoris: I25.10

## 2013-12-05 LAB — CBC
HCT: 39 % (ref 39.0–52.0)
Hemoglobin: 13.3 g/dL (ref 13.0–17.0)
MCH: 32.1 pg (ref 26.0–34.0)
MCHC: 34.1 g/dL (ref 30.0–36.0)
MCV: 94.2 fL (ref 78.0–100.0)
PLATELETS: 129 10*3/uL — AB (ref 150–400)
RBC: 4.14 MIL/uL — AB (ref 4.22–5.81)
RDW: 13.3 % (ref 11.5–15.5)
WBC: 6.9 10*3/uL (ref 4.0–10.5)

## 2013-12-05 LAB — BASIC METABOLIC PANEL
BUN: 20 mg/dL (ref 6–23)
CHLORIDE: 103 meq/L (ref 96–112)
CO2: 28 mEq/L (ref 19–32)
CREATININE: 1.4 mg/dL — AB (ref 0.50–1.35)
Calcium: 9.1 mg/dL (ref 8.4–10.5)
GFR calc Af Amer: 60 mL/min — ABNORMAL LOW (ref >90)
GFR calc non Af Amer: 52 mL/min — ABNORMAL LOW (ref >90)
Glucose, Bld: 132 mg/dL — ABNORMAL HIGH (ref 70–99)
POTASSIUM: 4.1 meq/L (ref 3.7–5.3)
Sodium: 145 mEq/L (ref 137–147)

## 2013-12-05 LAB — GLUCOSE, CAPILLARY: Glucose-Capillary: 221 mg/dL — ABNORMAL HIGH (ref 70–99)

## 2013-12-05 MED ORDER — CLOPIDOGREL BISULFATE 75 MG PO TABS: 75.0000 mg | ORAL_TABLET | Freq: Every day | ORAL | Status: DC

## 2013-12-05 MED ORDER — ASPIRIN 81 MG PO CHEW: 81.0000 mg | CHEWABLE_TABLET | Freq: Every day | ORAL | Status: DC

## 2013-12-05 NOTE — Progress Notes (Signed)
     SUBJECTIVE: No chest pain or SOB  BP 135/56  Pulse 58  Temp(Src) 98.2 F (36.8 C) (Oral)  Resp 20  Ht 5\' 8"  (1.727 m)  Wt 281 lb 12 oz (127.8 kg)  BMI 42.85 kg/m2  SpO2 97%  Intake/Output Summary (Last 24 hours) at 12/05/13 0716 Last data filed at 12/04/13 1900  Gross per 24 hour  Intake 1194.13 ml  Output      0 ml  Net 1194.13 ml    PHYSICAL EXAM General: Well developed, well nourished, in no acute distress. Alert and oriented x 3.  Psych:  Good affect, responds appropriately Neck: No JVD. No masses noted.  Lungs: Clear bilaterally with no wheezes or rhonci noted.  Heart: RRR with no murmurs noted. Abdomen: Bowel sounds are present. Soft, non-tender.  Extremities: No lower extremity edema.   LABS: Basic Metabolic Panel:  Recent Labs  12/02/13 1407 12/04/13 0645  NA 136 137  K 3.4* 4.4  CL 97 97  CO2 30 27  GLUCOSE 162* 234*  BUN 25* 29*  CREATININE 1.5 1.68*  CALCIUM 9.1 9.4   CBC:  Recent Labs  12/02/13 1407 12/05/13 0552  WBC 8.4 6.9  NEUTROABS 6.0  --   HGB 14.0 13.3  HCT 41.3 39.0  MCV 93.7 94.2  PLT 158.0 129*    Current Meds: . allopurinol  300 mg Oral Daily  . amLODipine  10 mg Oral Daily  . aspirin  81 mg Oral Daily  . carvedilol  3.125 mg Oral Daily  . clopidogrel  75 mg Oral Q breakfast  . doxazosin  8 mg Oral QHS  . fluticasone  1 spray Each Nare Daily  . insulin aspart  30 Units Subcutaneous QAC breakfast  . insulin aspart  35 Units Subcutaneous BID AC  . insulin NPH Human  45 Units Subcutaneous QHS  . isosorbide mononitrate  30 mg Oral Daily  . lisinopril  40 mg Oral BID  . simvastatin  40 mg Oral QHS  . varenicline  1 mg Oral q1800     ASSESSMENT AND PLAN:  1. CAD/Unstable angina: Pt admitted after outpatient cath yesterday. Found to have severe disease in the proximal RCA and in the posterolateral branch of the RCA. Now s/p DES x 1 proximal RCA and DES x 1 posterolateral branch. He is on ASA and Plavix. He did  well overnight. Will continue ASA and Plavix indefinetely. Will continue beta blocker, statin, Ace-inh, Imdur.   2. Dispo:D/C home after cardiac rehab if renal function is stable. Follow up with Dr. Marlou Porch as planned in 2 weeks.    MCALHANY,CHRISTOPHER  1/8/20157:16 AM

## 2013-12-05 NOTE — Progress Notes (Signed)
CARDIAC REHAB PHASE I   PRE:  Rate/Rhythm: 60 SR    BP: sitting 142/60    SaO2:   MODE:  Ambulation: 550 ft   POST:  Rate/Rhythm: 72 SR    BP: sitting 150/55     SaO2:   Tolerated well. Feels good. Good ed. Pt has been working on diet since December. Doing well without smoking. Sts he doesn't have time for CRPII but will call if something changes.  [id number]  Josephina Shih Kristan CES, ACSM 12/05/2013 9:00 AM

## 2013-12-05 NOTE — Discharge Summary (Signed)
Physician Discharge Summary     Patient ID: Bryan Haley MRN: [id number] DOB/AGE: [birth year] 63 y.o.  Admit date: 12/04/2013 Discharge date: 12/05/2013  Admission Diagnoses: Abnormal Stress Test  Discharge Diagnoses:  Principal Problem:   Abnormal stress test Active Problems:   Nonspecific abnormal unspecified cardiovascular function study   Coronary atherosclerosis of native coronary artery   Unstable angina   Discharged Condition: stable  Hospital Course: Bryan Haley is a 63 y.o. male smoker with diabetes, hypertension and hyperlipidemia, followed by Dr. Marlou Porch. He was seen in clinic for evaluation of chest pain, that was concerning for unstable angina. Dr. Marlou Porch ordered a NST, which was done on 11/27/13. The test read as an intermediate risk stress nuclear study with a moderate sized reversible inferolateral defect. He was subsequently referred for a LHC. He presented to [facility] on 12/04/13 for the procedure. The diagnostic portion was performed by Dr. Marlou Porch, via the right radial artery. He was found to have significant, diffuse RCA disease with proximal shelf of calcium/spontaneous dissection and the PDA bifurcation with PLA was also significantly diseased. He underwent successful PCI + stenting of the proximal PLA, as well as the RCA, utilizing DES. The intervention was performed by Dr. Irish Lack. He tolerated the procedure well. He left the cath lab in stable condition. He was placed on DAPT with ASA + Plavix. He was also continued on his BB, ACE-I, Imdur and statin. He was kept overnight for observation and hydration. He had no post cath complications. He had no difficulties and no further exertional angina, while ambulating with cardiac rehab. The right radial access site remained stable. His renal function was stable. He was last seen and examined by Dr. Angelena Form, who determined he was stable for discharge. He will f/u with Dr. Marlou Porch in clinic in on 12/17/13.    Consults:  None  Significant Diagnostic Studies:   LHC 12/04/12 HEMODYNAMICS: Aortic pressure was 947/65 mmHg; LV systolic pressure was 465 mmHg; LVEDP 25 mmHg. There was no gradient between the left ventricle and aorta.  ANGIOGRAPHIC DATA:  Left main: Minor luminal irregularities of the left main artery distally, branches into LAD and circumflex.  Left anterior descending (LAD): There is an occluded diagonal branch. This is being filled via right to left collateralization. There are minor luminal irregularities otherwise throughout the LAD approximately 30-40% in the midsegment. Appears non-flow-limiting.  Circumflex artery (CIRC): Small caliber vessel with 3 obtuse marginal branches. The second obtuse marginal branch is very small in caliber and significantly diseased in its proximal segment of up to 80%. The distal AV groove circumflex also has disease of approximate 70-80%. Fairly small caliber vessel.  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 The distal RCA and midsegment have diffuse mild luminal irregularities. At the bifurcation of the posterior lateral segment there is significant disease of approximately 80%. There is also disease of the posterior descending artery in its proximal segment of approximate 70%. There is collateral blood flow is seen traversing to the diagonal branch that is occluded in the left system.  LEFT VENTRICULOGRAM: This was not performed. Ejection fraction was 47% on nuclear stress test. Dye sparing procedure. Creatinine has ranged from 1.5-1.68.    Treatments: See Hospital Course  Discharge Exam: Blood pressure 142/60, pulse 63, temperature 98.2 F (36.8 C), temperature source Oral, resp. rate 20, height 5\' 8"  (1.727 m), weight 281 lb 12 oz (  127.8 kg), SpO2 97.00%.   Disposition: Final discharge disposition not confirmed   Future  Appointments Provider Department Dept Phone   12/17/2013 2:30 PM Candee Furbish, MD [facility] [phone]   02/06/2014 8:00 AM Renato Shin, MD [facility] [phone]       Medication List    ASK your doctor about these medications       allopurinol 300 MG tablet  Commonly known as:  ZYLOPRIM  Take 1 tablet (300 mg total) by mouth daily.     amLODipine 10 MG tablet  Commonly known as:  NORVASC  Take 1 tablet (10 mg total) by mouth daily.     aspirin 325 MG tablet  Take 325 mg by mouth daily.     B-D INSULIN SYRINGE 29G X 1/2" 1 ML Misc  Generic drug:  INS SYRINGE/NEEDLE 1CC/29G  Use as directed     carvedilol 3.125 MG tablet  Commonly known as:  COREG  Take 3.125 mg by mouth daily.     doxazosin 8 MG tablet  Commonly known as:  CARDURA  Take 8 mg by mouth at bedtime.     fluocinonide-emollient 0.05 % cream  Commonly known as:  LIDEX-E  Apply 1 application topically 2 (two) times daily.     fluticasone 50 MCG/ACT nasal spray  Commonly known as:  FLONASE  Place 1 spray into both nostrils daily.     furosemide 80 MG tablet  Commonly known as:  LASIX  Take 80 mg by mouth 2 (two) times daily.     indomethacin 75 MG CR capsule  Commonly known as:  INDOCIN SR  Take 75 mg by mouth 2 (two) times daily with a meal.     insulin aspart 100 UNIT/ML injection  Commonly known as:  novoLOG  Inject 30-35 Units into the skin 3 (three) times daily with meals. 30 units at breakfast. 35 units at lunch and dinner     insulin NPH Human 100 UNIT/ML injection  Commonly known as:  HUMULIN N,NOVOLIN N  Inject 45 Units into the skin at bedtime.     isosorbide mononitrate 30 MG 24 hr tablet  Commonly known as:  IMDUR  Take 1 tablet (30 mg total) by mouth daily.     lisinopril 40 MG tablet  Commonly known as:  PRINIVIL,ZESTRIL  Take 40 mg by mouth 2 (two) times daily.     losartan 100 MG tablet  Commonly known as:  COZAAR  Take 100 mg by  mouth 2 (two) times daily.     ONE TOUCH ULTRA TEST test strip  Generic drug:  glucose blood  TEST TWICE A DAY     Pen Needles 3/16" 31G X 5 MM Misc  Inject 1 pen as directed 3 (three) times daily. Use as directed 3x daily     simvastatin 40 MG tablet  Commonly known as:  ZOCOR  Take 1 tablet (40 mg total) by mouth at bedtime.     Testosterone 10 MG/ACT (2%) Gel  Place 5 Squirts onto the skin daily.     varenicline 1 MG tablet  Commonly known as:  CHANTIX  Take 1 mg by mouth daily.       TIME SPENT ON DISCHARGE, INCLUDING PHYSICIAN TIME: > 30 MINUTES  Signed: Derk Doubek, PA-C 12/05/2013, 7:56 AM

## 2013-12-05 NOTE — Discharge Summary (Signed)
See full note this am. cdm 

## 2013-12-08 ENCOUNTER — Other Ambulatory Visit: Payer: Self-pay | Admitting: Family Medicine

## 2013-12-13 ENCOUNTER — Telehealth: Payer: Self-pay

## 2013-12-13 NOTE — Telephone Encounter (Signed)
Pt called stating that his Blood sugar has been dropping in the 60s and 70s the past few days wanted Dr to be informed. Please advise, Thanks!

## 2013-12-13 NOTE — Telephone Encounter (Signed)
Please reduce each of your 4 daily insulin shots by 5 units.  Please call back if this does not help.

## 2013-12-16 NOTE — Telephone Encounter (Signed)
Pt informed

## 2013-12-17 ENCOUNTER — Ambulatory Visit (INDEPENDENT_AMBULATORY_CARE_PROVIDER_SITE_OTHER): Payer: Managed Care, Other (non HMO) | Admitting: Cardiology

## 2013-12-17 ENCOUNTER — Encounter: Payer: Self-pay | Admitting: Cardiology

## 2013-12-17 VITALS — BP 142/71 | HR 62 | Ht 70.0 in | Wt 282.8 lb

## 2013-12-17 DIAGNOSIS — I10 Essential (primary) hypertension: Secondary | ICD-10-CM

## 2013-12-17 DIAGNOSIS — I2510 Atherosclerotic heart disease of native coronary artery without angina pectoris: Secondary | ICD-10-CM

## 2013-12-17 DIAGNOSIS — E785 Hyperlipidemia, unspecified: Secondary | ICD-10-CM

## 2013-12-17 DIAGNOSIS — I208 Other forms of angina pectoris: Secondary | ICD-10-CM

## 2013-12-17 MED ORDER — NITROGLYCERIN 0.4 MG SL SUBL: 0.4000 mg | SUBLINGUAL_TABLET | SUBLINGUAL | Status: DC | PRN

## 2013-12-17 NOTE — Progress Notes (Signed)
[facility]. [address], [address] [place], [place]  [address] Phone: [phone] Fax:  [phone]  Date:  12/17/2013   ID:  Bryan Haley, DOB [birth year], MRN [id number]  PCP:  [name], MD   History of Present Illness: Bryan Haley is a 63 y.o. 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 He underwent successful percutaneous intervention of the proximal PLA as well as the proximal RCA utilizing drug-eluting stents. Dual antiplatelet therapy. Isosorbide, statin. LEFT radial artery approach.  He's been feeling very well since the stent placement. Doing well. No chest pain.   Wt Readings from Last 3 Encounters:  12/17/13 282 lb 12.8 oz (128.277 kg)  12/05/13 281 lb 12 oz (127.8 kg)  12/05/13 281 lb 12 oz (127.8 kg)     Past Medical History  Diagnosis Date  . HYPOGONADISM 06/11/2010  . GOUT 07/11/2007  . HYPERTENSION 07/06/2007  . COLONIC POLYPS, HX OF 10/02/2008  . RENAL INSUFFICIENCY 12/11/2008  . Hyperlipidemia   . Other and unspecified angina pectoris   . Coronary artery disease   . Exertional shortness of breath     "last 2-3 yrs" (12/04/2013)  . OSA (obstructive sleep apnea) 10/08/2009    wears CPAP "more than 1/2 the time"  . Diabetic retinopathy of both eyes     Laser surgery, Dr. Zadie Rhine  . DM W/COMPLICATION NOS, TYPE I 08/09/2007    "dx'd ~ 1994; I say it's type II" (12/04/2013)  . Coronary atherosclerosis of native coronary artery 12/05/2013    Prox RCA, and PLA DES 12/04/13.     Past Surgical History  Procedure Laterality Date  . Colonoscopy    . Carpal tunnel release Left ~ 2004  . Coronary angioplasty with stent placement  12/04/2013    "2 stents"  . Eye examination under anesthesia w/ retinal cryotherapy and retinal laser Bilateral 2005-2010      "for my diabetes" (12/04/2013)    Current Outpatient Prescriptions  Medication Sig Dispense Refill  . allopurinol (ZYLOPRIM) 300 MG tablet Take 1 tablet (300 mg total) by mouth daily.  100 tablet  3  . amLODipine (NORVASC) 10 MG tablet Take 1 tablet (10 mg total) by mouth daily.  100 tablet  3  . aspirin 81 MG chewable tablet Chew 1 tablet (81 mg total) by mouth daily.      . carvedilol (COREG) 3.125 MG tablet Take 3.125 mg by mouth daily.      . clopidogrel (PLAVIX) 75 MG tablet Take 1 tablet (75 mg total) by mouth daily with breakfast.  30 tablet  11  . doxazosin (CARDURA) 8 MG tablet Take 8 mg by mouth at bedtime.      . fluocinonide-emollient (LIDEX-E) 0.05 % cream Apply 1 application topically 2 (two) times daily.  30 g  0  . fluticasone (FLONASE) 50 MCG/ACT nasal spray Place 1 spray into both nostrils daily.      . furosemide (LASIX) 80 MG tablet Take 80 mg by mouth 2 (two) times daily.      . indomethacin (INDOCIN SR) 75 MG CR capsule Take 75 mg by mouth 2 (two) times daily with a meal.      . INS SYRINGE/NEEDLE 1CC/29G (B-D INSULIN SYRINGE) 29G X 1/2" 1 ML MISC Use as directed       . insulin aspart (NOVOLOG)  100 UNIT/ML injection Inject 30-35 Units into the skin 3 (three) times daily with meals. 30 units at breakfast. 35 units at lunch and dinner      . insulin NPH (HUMULIN N,NOVOLIN N) 100 UNIT/ML injection Inject 45 Units into the skin at bedtime.      . Insulin Pen Needle (PEN NEEDLES 3/16") 31G X 5 MM MISC Inject 1 pen as directed 3 (three) times daily. Use as directed 3x daily  100 each  6  . isosorbide mononitrate (IMDUR) 30 MG 24 hr tablet Take 1 tablet (30 mg total) by mouth daily.  90 tablet  3  . lisinopril (PRINIVIL,ZESTRIL) 40 MG tablet Take 40 mg by mouth 2 (two) times daily.      Marland Kitchen losartan (COZAAR) 100 MG tablet Take 100 mg by mouth 2 (two) times daily.      . ONE TOUCH ULTRA TEST test strip TEST TWICE A DAY  100 each  1  . simvastatin (ZOCOR) 40 MG tablet Take 1  tablet (40 mg total) by mouth at bedtime.  100 tablet  3  . Testosterone 10 MG/ACT (2%) GEL Place 5 Squirts onto the skin daily.  60 g  5  . varenicline (CHANTIX) 1 MG tablet Take 1 mg by mouth daily.       No current facility-administered medications for this visit.    Allergies:   No Known Allergies  Social History:  The patient  reports that he quit smoking about 3 years ago. His smoking use included Cigarettes. He has a 67.5 pack-year smoking history. He has never used smokeless tobacco. He reports that he drinks about 0.6 ounces of alcohol per week. He reports that he does not use illicit drugs.   ROS:  Please see the history of present illness.   Denies any bleeding, syncope, orthopnea, PND  PHYSICAL EXAM: VS:  BP 142/71  Pulse 62  Ht 5\' 10"  (1.778 m)  Wt 282 lb 12.8 oz (128.277 kg)  BMI 40.58 kg/m2 Well nourished, well developed, in no acute distress HEENT: normal Neck: no JVD Cardiac:  normal S1, S2; RRR; no murmur Lungs:  clear to auscultation bilaterally, no wheezing, rhonchi or rales Abd: soft, nontender, no hepatomegaly Ext: no edema Skin: warm and dry Neuro: no focal abnormalities noted  EKG:  None today Nuclear stress 12/14: Intermediate risk, inferolateral reversible defect. Cardiac catheterization 12/04/13-proximal RCA, PLA disease. Status post DES to both regions.    ASSESSMENT AND PLAN:  1. Coronary artery disease-status post percutaneous intervention as described above. RCA region. Overall doing well. No exertional anginal symptoms. Continue with dual antiplatelet therapy. USE LEFT RADIAL in future.  2. Angina-resolved post percutaneous intervention. Multidrug regimen. NTG rx. Does not wish to participating cardiac rehabilitation because insurance does not cover. 3. Hyperlipidemia-continue with statin therapy. LDL goal less than 70. 4. Hypertension-currently well controlled. 5. Morbid obesity-continue to encourage weight loss. He is very motivated  currently.  Signed, Candee Furbish, MD [facility]  12/17/2013 2:50 PM

## 2013-12-17 NOTE — Patient Instructions (Addendum)
Your physician has recommended you make the following change in your medication:   1. Start Nitroglycerin Place 1 tablet (0.4 mg total) under the tongue every 5 (five) minutes as needed for chest pain.   Your physician recommends that you schedule a follow-up appointment in: 3 months with Dr. Marlou Porch

## 2013-12-20 ENCOUNTER — Other Ambulatory Visit: Payer: Self-pay | Admitting: Family Medicine

## 2014-02-06 ENCOUNTER — Ambulatory Visit (INDEPENDENT_AMBULATORY_CARE_PROVIDER_SITE_OTHER): Payer: Managed Care, Other (non HMO) | Admitting: Endocrinology

## 2014-02-06 ENCOUNTER — Encounter: Payer: Self-pay | Admitting: Endocrinology

## 2014-02-06 VITALS — BP 122/70 | HR 67 | Temp 98.0°F | Ht 70.0 in | Wt 283.0 lb

## 2014-02-06 DIAGNOSIS — E1029 Type 1 diabetes mellitus with other diabetic kidney complication: Secondary | ICD-10-CM

## 2014-02-06 DIAGNOSIS — E291 Testicular hypofunction: Secondary | ICD-10-CM

## 2014-02-06 LAB — TESTOSTERONE: Testosterone: 473.5 ng/dL (ref 350.00–890.00)

## 2014-02-06 LAB — HEMOGLOBIN A1C: Hgb A1c MFr Bld: 7.9 % — ABNORMAL HIGH (ref 4.6–6.5)

## 2014-02-06 MED ORDER — INSULIN ASPART 100 UNIT/ML ~~LOC~~ SOLN: SUBCUTANEOUS | Status: DC

## 2014-02-06 NOTE — Patient Instructions (Addendum)
Please come back for a follow-up appointment in 3 months.  check your blood sugar twice a day.  vary the time of day when you check, between before the 3 meals, and at bedtime.  also check if you have symptoms of your blood sugar being too high or too low.  please keep a record of the readings and bring it to your next appointment here.  You can write it on any piece of paper.  please call us sooner if your blood sugar goes below 70, or if you have a lot of readings over 200.  blood tests are being requested for you today.  We'll contact you with results.    

## 2014-02-06 NOTE — Progress Notes (Signed)
Subjective:    Patient ID: Bryan Haley, male    DOB: 06/04/1951, 63 y.o.   MRN: [id number]  HPI Pt returns for f/u of insulin-requiring DM (dx'ed 2001, when he presented with fatigue; he has mild if any neuropathy of the lower extremities; he has associated renal insufficiency and CAD; he has been on insulin since 2009; he has never had severe hypoglycemia or DKA).  Pt called 2 months ago, and reports frequent hypoglycemia.  This resolved with reduction of his insulin.  no cbg record, but states cbg's are highest in am (higher than at hs, even with no hs-snack).  He feels no different since the testosterone was increased. Past Medical History  Diagnosis Date  . HYPOGONADISM 06/11/2010  . GOUT 07/11/2007  . HYPERTENSION 07/06/2007  . COLONIC POLYPS, HX OF 10/02/2008  . RENAL INSUFFICIENCY 12/11/2008  . Hyperlipidemia   . Other and unspecified angina pectoris   . Coronary artery disease   . Exertional shortness of breath     "last 2-3 yrs" (12/04/2013)  . OSA (obstructive sleep apnea) 10/08/2009    wears CPAP "more than 1/2 the time"  . Diabetic retinopathy of both eyes     Laser surgery, Dr. Zadie Rhine  . DM W/COMPLICATION NOS, TYPE I 08/09/2007    "dx'd ~ 1994; I say it's type II" (12/04/2013)  . Coronary atherosclerosis of native coronary artery 12/05/2013    Prox RCA, and PLA DES 12/04/13.     Past Surgical History  Procedure Laterality Date  . Colonoscopy    . Carpal tunnel release Left ~ 2004  . Coronary angioplasty with stent placement  12/04/2013    "2 stents"  . Eye examination under anesthesia w/ retinal cryotherapy and retinal laser Bilateral 2005-2010    "for my diabetes" (12/04/2013)    History   Social History  . Marital Status: Married    Spouse Name: N/A    Number of Children: N/A  . Years of Education: N/A   Occupational History  . sales    Social History Main Topics  . Smoking status: Former Smoker -- 1.50 packs/day for 45 years    Types: Cigarettes    Quit date:  10/02/2010  . Smokeless tobacco: Never Used  . Alcohol Use: 0.6 oz/week    1 Shots of liquor per week  . Drug Use: No  . Sexual Activity: Yes   Other Topics Concern  . Not on file   Social History Narrative   Regular exercise-no   Pt went to seminar to quit smoking    Current Outpatient Prescriptions on File Prior to Visit  Medication Sig Dispense Refill  . allopurinol (ZYLOPRIM) 300 MG tablet Take 1 tablet (300 mg total) by mouth daily.  100 tablet  3  . amLODipine (NORVASC) 10 MG tablet Take 1 tablet (10 mg total) by mouth daily.  100 tablet  3  . aspirin 81 MG chewable tablet Chew 1 tablet (81 mg total) by mouth daily.      . carvedilol (COREG) 3.125 MG tablet Take 3.125 mg by mouth daily.      . clopidogrel (PLAVIX) 75 MG tablet Take 1 tablet (75 mg total) by mouth daily with breakfast.  30 tablet  11  . doxazosin (CARDURA) 8 MG tablet Take 8 mg by mouth at bedtime.      . fluocinonide-emollient (LIDEX-E) 0.05 % cream Apply 1 application topically 2 (two) times daily.  30 g  0  . fluticasone (FLONASE) 50 MCG/ACT nasal  spray Place 1 spray into both nostrils daily.      . furosemide (LASIX) 80 MG tablet Take 80 mg by mouth 2 (two) times daily.      . indomethacin (INDOCIN SR) 75 MG CR capsule Take 75 mg by mouth 2 (two) times daily with a meal.      . INS SYRINGE/NEEDLE 1CC/29G (B-D INSULIN SYRINGE) 29G X 1/2" 1 ML MISC Use as directed       . insulin NPH (HUMULIN N,NOVOLIN N) 100 UNIT/ML injection Inject 50 Units into the skin at bedtime.       . Insulin Pen Needle (PEN NEEDLES 3/16") 31G X 5 MM MISC Inject 1 pen as directed 3 (three) times daily. Use as directed 3x daily  100 each  6  . isosorbide mononitrate (IMDUR) 30 MG 24 hr tablet Take 1 tablet (30 mg total) by mouth daily.  90 tablet  3  . lisinopril (PRINIVIL,ZESTRIL) 40 MG tablet Take 40 mg by mouth 2 (two) times daily.      Marland Kitchen losartan (COZAAR) 100 MG tablet Take 100 mg by mouth 2 (two) times daily.      . nitroGLYCERIN  (NITROSTAT) 0.4 MG SL tablet Place 1 tablet (0.4 mg total) under the tongue every 5 (five) minutes as needed for chest pain.  30 tablet  3  . ONE TOUCH ULTRA TEST test strip TEST TWICE A DAY  100 each  1  . simvastatin (ZOCOR) 40 MG tablet Take 1 tablet (40 mg total) by mouth at bedtime.  100 tablet  3  . Testosterone 10 MG/ACT (2%) GEL Place 5 Squirts onto the skin daily.  60 g  5  . varenicline (CHANTIX) 1 MG tablet Take 1 mg by mouth daily.       No current facility-administered medications on file prior to visit.   No Known Allergies  Family History  Problem Relation Age of Onset  . Cancer Neg Hx     No family history of colon cancer  . Colon cancer Neg Hx   . Esophageal cancer Neg Hx   . Stomach cancer Neg Hx   . Rectal cancer Neg Hx   . Diabetes Other   . Hyperlipidemia Other   . Hypertension Other   . Heart disease Father    BP 122/70  Pulse 67  Temp(Src) 98 F (36.7 C) (Oral)  Ht 5\' 10"  (1.778 m)  Wt 283 lb (128.368 kg)  BMI 40.61 kg/m2  SpO2 97%  Review of Systems Denies LOC and decreased urinary stream.    Objective:   Physical Exam VITAL SIGNS:  See vs page GENERAL: no distress  Lab Results  Component Value Date   HGBA1C 7.9* 02/06/2014   Lab Results  Component Value Date   TESTOSTERONE 473.50 02/06/2014      Assessment & Plan:  DM: he needs increased rx. This insulin regimen was chosen from multiple options, as it best matches his insulin to his changing requirements throughout the day.  The benefits of glycemic control must be weighed against the risks of hypoglycemia.   Renal insufficiency: this increases the risk of hypoglycemia. Hypogonadism: well-controlled

## 2014-02-13 ENCOUNTER — Telehealth: Payer: Self-pay | Admitting: Family Medicine

## 2014-02-13 MED ORDER — LOSARTAN POTASSIUM 100 MG PO TABS: 100.0000 mg | ORAL_TABLET | Freq: Two times a day (BID) | ORAL | Status: DC

## 2014-02-13 NOTE — Telephone Encounter (Signed)
Pt request refill of losartan (COZAAR) 100 MG tablet Pt is out and it was declined at the pharm Wife states pt has been on this for years and needs asap. [facility] [address]  Pt must do 90 day due to insurance requirements.

## 2014-04-04 ENCOUNTER — Other Ambulatory Visit: Payer: Self-pay

## 2014-04-04 MED ORDER — INSULIN NPH (HUMAN) (ISOPHANE) 100 UNIT/ML ~~LOC~~ SUSP: 50.0000 [IU] | Freq: Every day | SUBCUTANEOUS | Status: DC

## 2014-04-08 ENCOUNTER — Ambulatory Visit (INDEPENDENT_AMBULATORY_CARE_PROVIDER_SITE_OTHER): Payer: Managed Care, Other (non HMO) | Admitting: Family Medicine

## 2014-04-08 ENCOUNTER — Encounter: Payer: Self-pay | Admitting: Family Medicine

## 2014-04-08 VITALS — BP 130/64 | HR 69 | Temp 98.8°F | Ht 70.0 in | Wt 287.0 lb

## 2014-04-08 DIAGNOSIS — J310 Chronic rhinitis: Secondary | ICD-10-CM

## 2014-04-08 DIAGNOSIS — J329 Chronic sinusitis, unspecified: Secondary | ICD-10-CM

## 2014-04-08 MED ORDER — AMOXICILLIN 875 MG PO TABS: 875.0000 mg | ORAL_TABLET | Freq: Two times a day (BID) | ORAL | Status: DC

## 2014-04-08 MED ORDER — HYDROCODONE-HOMATROPINE 5-1.5 MG/5ML PO SYRP: 5.0000 mL | ORAL_SOLUTION | Freq: Three times a day (TID) | ORAL | Status: DC | PRN

## 2014-04-08 NOTE — Addendum Note (Signed)
Addended by: Lucretia Kern on: 04/08/2014 02:21 PM   Modules accepted: Orders

## 2014-04-08 NOTE — Progress Notes (Signed)
Pre visit review using our clinic review tool, if applicable. No additional management support is needed unless otherwise documented below in the visit note. 

## 2014-04-08 NOTE — Patient Instructions (Signed)
-  afrin twice daily for 4 days   -cough medication if needed  -antibiotic only if worsening or not improving as expected and see your doctor if worsening- shred prescription if you do not use it

## 2014-04-08 NOTE — Progress Notes (Signed)
No chief complaint on file.   HPI:  -started: last week -symptoms:nasal congestion, sore throat, cough -denies:fever, SOB, NVD, tooth pain -has tried: his flonase and antihistamine -sick contacts/travel/risks: denies flu exposure, tick exposure or or Ebola risks -Hx of: allergies ROS: See pertinent positives and negatives per HPI.  Past Medical History  Diagnosis Date  . HYPOGONADISM 06/11/2010  . GOUT 07/11/2007  . HYPERTENSION 07/06/2007  . COLONIC POLYPS, HX OF 10/02/2008  . RENAL INSUFFICIENCY 12/11/2008  . Hyperlipidemia   . Other and unspecified angina pectoris   . Coronary artery disease   . Exertional shortness of breath     "last 2-3 yrs" (12/04/2013)  . OSA (obstructive sleep apnea) 10/08/2009    wears CPAP "more than 1/2 the time"  . Diabetic retinopathy of both eyes     Laser surgery, Dr. Zadie Rhine  . DM W/COMPLICATION NOS, TYPE I 08/09/2007    "dx'd ~ 1994; I say it's type II" (12/04/2013)  . Coronary atherosclerosis of native coronary artery 12/05/2013    Prox RCA, and PLA DES 12/04/13.     Past Surgical History  Procedure Laterality Date  . Colonoscopy    . Carpal tunnel release Left ~ 2004  . Coronary angioplasty with stent placement  12/04/2013    "2 stents"  . Eye examination under anesthesia w/ retinal cryotherapy and retinal laser Bilateral 2005-2010    "for my diabetes" (12/04/2013)    Family History  Problem Relation Age of Onset  . Cancer Neg Hx     No family history of colon cancer  . Colon cancer Neg Hx   . Esophageal cancer Neg Hx   . Stomach cancer Neg Hx   . Rectal cancer Neg Hx   . Diabetes Other   . Hyperlipidemia Other   . Hypertension Other   . Heart disease Father     History   Social History  . Marital Status: Married    Spouse Name: N/A    Number of Children: N/A  . Years of Education: N/A   Occupational History  . sales    Social History Main Topics  . Smoking status: Former Smoker -- 1.50 packs/day for 45 years    Types:  Cigarettes    Quit date: 10/02/2010  . Smokeless tobacco: Never Used  . Alcohol Use: 0.6 oz/week    1 Shots of liquor per week  . Drug Use: No  . Sexual Activity: Yes   Other Topics Concern  . None   Social History Narrative   Regular exercise-no   Pt went to seminar to quit smoking    Current outpatient prescriptions:allopurinol (ZYLOPRIM) 300 MG tablet, Take 1 tablet (300 mg total) by mouth daily., Disp: 100 tablet, Rfl: 3;  amLODipine (NORVASC) 10 MG tablet, Take 1 tablet (10 mg total) by mouth daily., Disp: 100 tablet, Rfl: 3;  aspirin 81 MG chewable tablet, Chew 1 tablet (81 mg total) by mouth daily., Disp: , Rfl: ;  carvedilol (COREG) 3.125 MG tablet, Take 3.125 mg by mouth daily., Disp: , Rfl:  clopidogrel (PLAVIX) 75 MG tablet, Take 1 tablet (75 mg total) by mouth daily with breakfast., Disp: 30 tablet, Rfl: 11;  doxazosin (CARDURA) 8 MG tablet, Take 8 mg by mouth at bedtime., Disp: , Rfl: ;  fluocinonide-emollient (LIDEX-E) 0.05 % cream, Apply 1 application topically 2 (two) times daily., Disp: 30 g, Rfl: 0;  fluticasone (FLONASE) 50 MCG/ACT nasal spray, Place 1 spray into both nostrils daily., Disp: , Rfl:  furosemide (LASIX)  80 MG tablet, Take 80 mg by mouth 2 (two) times daily., Disp: , Rfl: ;  indomethacin (INDOCIN SR) 75 MG CR capsule, Take 75 mg by mouth 2 (two) times daily with a meal., Disp: , Rfl: ;  INS SYRINGE/NEEDLE 1CC/29G (B-D INSULIN SYRINGE) 29G X 1/2" 1 ML MISC, Use as directed , Disp: , Rfl: ;  insulin aspart (NOVOLOG) 100 UNIT/ML injection, 25 units at breakfast. 30 units at lunch and 40 units at dinner, Disp: 30 mL, Rfl: 3 insulin NPH Human (HUMULIN N,NOVOLIN N) 100 UNIT/ML injection, Inject 0.5 mLs (50 Units total) into the skin at bedtime., Disp: 20 mL, Rfl: 2;  Insulin Pen Needle (PEN NEEDLES 3/16") 31G X 5 MM MISC, Inject 1 pen as directed 3 (three) times daily. Use as directed 3x daily, Disp: 100 each, Rfl: 6;  isosorbide mononitrate (IMDUR) 30 MG 24 hr tablet,  Take 1 tablet (30 mg total) by mouth daily., Disp: 90 tablet, Rfl: 3 lisinopril (PRINIVIL,ZESTRIL) 40 MG tablet, Take 40 mg by mouth 2 (two) times daily., Disp: , Rfl: ;  losartan (COZAAR) 100 MG tablet, Take 1 tablet (100 mg total) by mouth 2 (two) times daily., Disp: 180 tablet, Rfl: 3;  nitroGLYCERIN (NITROSTAT) 0.4 MG SL tablet, Place 1 tablet (0.4 mg total) under the tongue every 5 (five) minutes as needed for chest pain., Disp: 30 tablet, Rfl: 3 ONE TOUCH ULTRA TEST test strip, TEST TWICE A DAY, Disp: 100 each, Rfl: 1;  simvastatin (ZOCOR) 40 MG tablet, Take 1 tablet (40 mg total) by mouth at bedtime., Disp: 100 tablet, Rfl: 3;  Testosterone 10 MG/ACT (2%) GEL, Place 5 Squirts onto the skin daily., Disp: 60 g, Rfl: 5;  varenicline (CHANTIX) 1 MG tablet, Take 1 mg by mouth daily., Disp: , Rfl:  amoxicillin (AMOXIL) 875 MG tablet, Take 1 tablet (875 mg total) by mouth 2 (two) times daily., Disp: 20 tablet, Rfl: 0  EXAM:  Filed Vitals:   04/08/14 1410  BP: 130/64  Pulse: 69  Temp: 98.8 F (37.1 C)    Body mass index is 41.18 kg/(m^2).  GENERAL: vitals reviewed and listed above, alert, oriented, appears well hydrated and in no acute distress  HEENT: atraumatic, conjunttiva clear, no obvious abnormalities on inspection of external nose and ears, normal appearance of ear canals and TMs, clear nasal congestion, mild post oropharyngeal erythema with PND, no tonsillar edema or exudate, no sinus TTP  NECK: no obvious masses on inspection  LUNGS: clear to auscultation bilaterally, no wheezes, rales or rhonchi, good air movement  CV: HRRR, no peripheral edema  MS: moves all extremities without noticeable abnormality  PSYCH: pleasant and cooperative, no obvious depression or anxiety  ASSESSMENT AND PLAN:  Discussed the following assessment and plan:  Rhinosinusitis - Plan: amoxicillin (AMOXIL) 875 MG tablet  -given HPI and exam findings today, a serious infection or illness is unlikely.  We discussed potential etiologies, with VURI being most likely, and advised supportive care and monitoring. We discussed treatment side effects, likely course, antibiotic misuse, transmission, and signs of developing a serious illness. -he is worried about this turning into a bacterial sinus infection as reports this has happenend in the past -of course, we advised to return or notify a doctor immediately if symptoms worsen or persist or new concerns arise.    Patient Instructions  -afrin twice daily for 4 days   -cough medication if needed  -antibiotic only if worsening or not improving as expected and see your doctor if worsening-  shred prescription if you do not use it       Lucretia Kern

## 2014-05-09 ENCOUNTER — Encounter: Payer: Self-pay | Admitting: Endocrinology

## 2014-05-09 ENCOUNTER — Ambulatory Visit (INDEPENDENT_AMBULATORY_CARE_PROVIDER_SITE_OTHER): Payer: Managed Care, Other (non HMO) | Admitting: Endocrinology

## 2014-05-09 VITALS — BP 108/64 | HR 66 | Temp 98.3°F | Ht 70.0 in | Wt 280.0 lb

## 2014-05-09 DIAGNOSIS — E1029 Type 1 diabetes mellitus with other diabetic kidney complication: Secondary | ICD-10-CM

## 2014-05-09 LAB — HEMOGLOBIN A1C: Hgb A1c MFr Bld: 8.1 % — ABNORMAL HIGH (ref 4.6–6.5)

## 2014-05-09 NOTE — Patient Instructions (Addendum)
Please come back for a follow-up appointment in 3 months.  check your blood sugar twice a day.  vary the time of day when you check, between before the 3 meals, and at bedtime.  also check if you have symptoms of your blood sugar being too high or too low.  please keep a record of the readings and bring it to your next appointment here.  You can write it on any piece of paper.  please call us sooner if your blood sugar goes below 70, or if you have a lot of readings over 200.  blood tests are being requested for you today.  We'll contact you with results.    

## 2014-05-09 NOTE — Progress Notes (Signed)
Subjective:    Patient ID: Bryan Haley, male    DOB: Nov 22, 1951, 63 y.o.   MRN: [id number]  HPI Pt returns for f/u of insulin-requiring DM (dx'ed 2001, when he presented with fatigue; he has mild if any neuropathy of the lower extremities; he has associated renal insufficiency and CAD; he has been on insulin since 2009; he has never had pancreatitis, severe hypoglycemia or DKA; ins declined weight-loss surgery).  no cbg record, but states cbg's are mildly low in the afternoon approx once a month.  It is highest after breakfast.   pt states he feels well in general.   Past Medical History  Diagnosis Date  . HYPOGONADISM 06/11/2010  . GOUT 07/11/2007  . HYPERTENSION 07/06/2007  . COLONIC POLYPS, HX OF 10/02/2008  . RENAL INSUFFICIENCY 12/11/2008  . Hyperlipidemia   . Other and unspecified angina pectoris   . Coronary artery disease   . Exertional shortness of breath     "last 2-3 yrs" (12/04/2013)  . OSA (obstructive sleep apnea) 10/08/2009    wears CPAP "more than 1/2 the time"  . Diabetic retinopathy of both eyes     Laser surgery, Dr. Zadie Rhine  . DM W/COMPLICATION NOS, TYPE I 08/09/2007    "dx'd ~ 1994; I say it's type II" (12/04/2013)  . Coronary atherosclerosis of native coronary artery 12/05/2013    Prox RCA, and PLA DES 12/04/13.     Past Surgical History  Procedure Laterality Date  . Colonoscopy    . Carpal tunnel release Left ~ 2004  . Coronary angioplasty with stent placement  12/04/2013    "2 stents"  . Eye examination under anesthesia w/ retinal cryotherapy and retinal laser Bilateral 2005-2010    "for my diabetes" (12/04/2013)    History   Social History  . Marital Status: Married    Spouse Name: N/A    Number of Children: N/A  . Years of Education: N/A   Occupational History  . sales    Social History Main Topics  . Smoking status: Former Smoker -- 1.50 packs/day for 45 years    Types: Cigarettes    Quit date: 10/02/2010  . Smokeless tobacco: Never Used  . Alcohol  Use: 0.6 oz/week    1 Shots of liquor per week  . Drug Use: No  . Sexual Activity: Yes   Other Topics Concern  . Not on file   Social History Narrative   Regular exercise-no   Pt went to seminar to quit smoking    Current Outpatient Prescriptions on File Prior to Visit  Medication Sig Dispense Refill  . allopurinol (ZYLOPRIM) 300 MG tablet Take 1 tablet (300 mg total) by mouth daily.  100 tablet  3  . amLODipine (NORVASC) 10 MG tablet Take 1 tablet (10 mg total) by mouth daily.  100 tablet  3  . amoxicillin (AMOXIL) 875 MG tablet Take 1 tablet (875 mg total) by mouth 2 (two) times daily.  20 tablet  0  . aspirin 81 MG chewable tablet Chew 1 tablet (81 mg total) by mouth daily.      . carvedilol (COREG) 3.125 MG tablet Take 3.125 mg by mouth daily.      . clopidogrel (PLAVIX) 75 MG tablet Take 1 tablet (75 mg total) by mouth daily with breakfast.  30 tablet  11  . doxazosin (CARDURA) 8 MG tablet Take 8 mg by mouth at bedtime.      . fluocinonide-emollient (LIDEX-E) 0.05 % cream Apply 1 application topically  2 (two) times daily.  30 g  0  . fluticasone (FLONASE) 50 MCG/ACT nasal spray Place 1 spray into both nostrils daily.      . furosemide (LASIX) 80 MG tablet Take 80 mg by mouth 2 (two) times daily.      Marland Kitchen HYDROcodone-homatropine (HYCODAN) 5-1.5 MG/5ML syrup Take 5 mLs by mouth every 8 (eight) hours as needed for cough.  120 mL  0  . indomethacin (INDOCIN SR) 75 MG CR capsule Take 75 mg by mouth 2 (two) times daily with a meal.      . INS SYRINGE/NEEDLE 1CC/29G (B-D INSULIN SYRINGE) 29G X 1/2" 1 ML MISC Use as directed       . insulin NPH Human (HUMULIN N,NOVOLIN N) 100 UNIT/ML injection Inject 0.5 mLs (50 Units total) into the skin at bedtime.  20 mL  2  . Insulin Pen Needle (PEN NEEDLES 3/16") 31G X 5 MM MISC Inject 1 pen as directed 3 (three) times daily. Use as directed 3x daily  100 each  6  . isosorbide mononitrate (IMDUR) 30 MG 24 hr tablet Take 1 tablet (30 mg total) by mouth  daily.  90 tablet  3  . lisinopril (PRINIVIL,ZESTRIL) 40 MG tablet Take 40 mg by mouth 2 (two) times daily.      Marland Kitchen losartan (COZAAR) 100 MG tablet Take 1 tablet (100 mg total) by mouth 2 (two) times daily.  180 tablet  3  . nitroGLYCERIN (NITROSTAT) 0.4 MG SL tablet Place 1 tablet (0.4 mg total) under the tongue every 5 (five) minutes as needed for chest pain.  30 tablet  3  . ONE TOUCH ULTRA TEST test strip TEST TWICE A DAY  100 each  1  . simvastatin (ZOCOR) 40 MG tablet Take 1 tablet (40 mg total) by mouth at bedtime.  100 tablet  3  . Testosterone 10 MG/ACT (2%) GEL Place 5 Squirts onto the skin daily.  60 g  5  . varenicline (CHANTIX) 1 MG tablet Take 1 mg by mouth daily.       No current facility-administered medications on file prior to visit.    No Known Allergies  Family History  Problem Relation Age of Onset  . Cancer Neg Hx     No family history of colon cancer  . Colon cancer Neg Hx   . Esophageal cancer Neg Hx   . Stomach cancer Neg Hx   . Rectal cancer Neg Hx   . Diabetes Other   . Hyperlipidemia Other   . Hypertension Other   . Heart disease Father     BP 108/64  Pulse 66  Temp(Src) 98.3 F (36.8 C) (Oral)  Ht 5\' 10"  (1.778 m)  Wt 280 lb (127.007 kg)  BMI 40.18 kg/m2  SpO2 96%     Review of Systems Denies LOC and weight change.      Objective:   Physical Exam Pulses: dorsalis pedis intact bilat.   Feet: no deformity. normal color and temp.  Trace bilat leg edema.   Skin:  no ulcer on the feet.  Spotty hyperpigmentation of the legs.   Neuro: sensation is intact to touch on the feet.   Lab Results  Component Value Date   HGBA1C 8.1* 05/09/2014      Assessment & Plan:  DM: moderate exacerbation. Side-effect of treatment: hypoglycemia.  This limits the ability to achieve glycemic control.  This impairs the ability to achieve glycemic control.  I'll work around this as best I  can Noncompliance with cbg recording: I'll work around this also as best I  can   Patient is advised the following: Patient Instructions  Please come back for a follow-up appointment in 3 months check your blood sugar twice a day.  vary the time of day when you check, between before the 3 meals, and at bedtime.  also check if you have symptoms of your blood sugar being too high or too low.  please keep a record of the readings and bring it to your next appointment here.  You can write it on any piece of paper.  please call us sooner if your blood sugar goes below 70, or if you have a lot of readings over 200.  blood tests are being requested for you today.  We'll contact you with results.

## 2014-05-15 ENCOUNTER — Ambulatory Visit (INDEPENDENT_AMBULATORY_CARE_PROVIDER_SITE_OTHER): Payer: Managed Care, Other (non HMO) | Admitting: Cardiology

## 2014-05-15 ENCOUNTER — Encounter: Payer: Self-pay | Admitting: Cardiology

## 2014-05-15 VITALS — BP 140/71 | HR 63 | Ht 70.0 in | Wt 278.8 lb

## 2014-05-15 DIAGNOSIS — I10 Essential (primary) hypertension: Secondary | ICD-10-CM | POA: Insufficient documentation

## 2014-05-15 DIAGNOSIS — I208 Other forms of angina pectoris: Secondary | ICD-10-CM

## 2014-05-15 DIAGNOSIS — E7800 Pure hypercholesterolemia, unspecified: Secondary | ICD-10-CM

## 2014-05-15 DIAGNOSIS — I2510 Atherosclerotic heart disease of native coronary artery without angina pectoris: Secondary | ICD-10-CM

## 2014-05-15 DIAGNOSIS — F17200 Nicotine dependence, unspecified, uncomplicated: Secondary | ICD-10-CM

## 2014-05-15 LAB — LIPID PANEL
CHOL/HDL RATIO: 4
Cholesterol: 107 mg/dL (ref 0–200)
HDL: 29.4 mg/dL — ABNORMAL LOW (ref >39.00)
NONHDL: 77.6
Triglycerides: 410 mg/dL — ABNORMAL HIGH (ref 0.0–149.0)
VLDL: 82 mg/dL — AB (ref 0.0–40.0)

## 2014-05-15 NOTE — Patient Instructions (Signed)
Your physician recommends that you a lipid profile: today  Your physician wants you to follow-up in: 1 year with Dr. Dawna Part will receive a reminder letter in the mail two months in advance. If you don't receive a letter, please call our office to schedule the follow-up appointment.    Your physician recommends that you continue on your current medications as directed. Please refer to the Current Medication list given to you today.

## 2014-05-15 NOTE — Progress Notes (Signed)
[place]. [address], [place]  [address] Phone: [phone] Fax:  [phone]  Date:  05/15/2014   ID:  ASAHEL RISDEN, DOB [birth year], MRN [id number]  PCP:  [name], MD   History of Present Illness: Bryan Haley is a 63 y.o. 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 He underwent successful percutaneous intervention of the proximal PLA as well as the proximal RCA utilizing drug-eluting stents. Dual antiplatelet therapy. Isosorbide, statin. LEFT radial artery approach.  He's been feeling very well since the stent placement. Doing well. No chest pain.    Wt Readings from Last 3 Encounters:  05/15/14 278 lb 12.8 oz (126.463 kg)  05/09/14 280 lb (127.007 kg)  04/08/14 287 lb (130.182 kg)     Past Medical History  Diagnosis Date  . HYPOGONADISM 06/11/2010  . GOUT 07/11/2007  . HYPERTENSION 07/06/2007  . COLONIC POLYPS, HX OF 10/02/2008  . RENAL INSUFFICIENCY 12/11/2008  . Hyperlipidemia   . Other and unspecified angina pectoris   . Coronary artery disease   . Exertional shortness of breath     "last 2-3 yrs" (12/04/2013)  . OSA (obstructive sleep apnea) 10/08/2009    wears CPAP "more than 1/2 the time"  . Diabetic retinopathy of both eyes     Laser surgery, Dr. Zadie Rhine  . DM W/COMPLICATION NOS, TYPE I 08/09/2007    "dx'd ~ 1994; I say it's type II" (12/04/2013)  . Coronary atherosclerosis of native coronary artery 12/05/2013    Prox RCA, and PLA DES 12/04/13.     Past Surgical History  Procedure Laterality Date  . Colonoscopy    . Carpal tunnel release Left ~ 2004  . Coronary angioplasty with stent placement  12/04/2013    "2 stents"  . Eye examination under anesthesia w/ retinal cryotherapy and retinal laser Bilateral 2005-2010    "for  my diabetes" (12/04/2013)    Current Outpatient Prescriptions  Medication Sig Dispense Refill  . allopurinol (ZYLOPRIM) 300 MG tablet Take 1 tablet (300 mg total) by mouth daily.  100 tablet  3  . amLODipine (NORVASC) 10 MG tablet Take 1 tablet (10 mg total) by mouth daily.  100 tablet  3  . amoxicillin (AMOXIL) 875 MG tablet Take 1 tablet (875 mg total) by mouth 2 (two) times daily.  20 tablet  0  . aspirin 81 MG chewable tablet Chew 1 tablet (81 mg total) by mouth daily.      . carvedilol (COREG) 3.125 MG tablet Take 3.125 mg by mouth daily.      . clopidogrel (PLAVIX) 75 MG tablet Take 1 tablet (75 mg total) by mouth daily with breakfast.  30 tablet  11  . doxazosin (CARDURA) 8 MG tablet Take 8 mg by mouth at bedtime.      . fluocinonide-emollient (LIDEX-E) 0.05 % cream Apply 1 application topically 2 (two) times daily.  30 g  0  . fluticasone (FLONASE) 50 MCG/ACT nasal spray Place 1 spray into both nostrils daily.      . furosemide (LASIX) 80 MG tablet Take 80 mg by mouth 2 (two) times daily.      Marland Kitchen HYDROcodone-homatropine (HYCODAN) 5-1.5 MG/5ML syrup Take 5 mLs by mouth every 8 (eight) hours as needed for cough.  120 mL  0  . indomethacin (INDOCIN SR) 75 MG  CR capsule Take 75 mg by mouth 2 (two) times daily with a meal.      . INS SYRINGE/NEEDLE 1CC/29G (B-D INSULIN SYRINGE) 29G X 1/2" 1 ML MISC Use as directed       . insulin aspart (NOVOLOG) 100 UNIT/ML injection 40 units at breakfast. 20 units at lunch and 40 units at dinner      . insulin NPH Human (HUMULIN N,NOVOLIN N) 100 UNIT/ML injection Inject 0.5 mLs (50 Units total) into the skin at bedtime.  20 mL  2  . Insulin Pen Needle (PEN NEEDLES 3/16") 31G X 5 MM MISC Inject 1 pen as directed 3 (three) times daily. Use as directed 3x daily  100 each  6  . isosorbide mononitrate (IMDUR) 30 MG 24 hr tablet Take 1 tablet (30 mg total) by mouth daily.  90 tablet  3  . lisinopril (PRINIVIL,ZESTRIL) 40 MG tablet Take 40 mg by mouth 2 (two) times  daily.      Marland Kitchen losartan (COZAAR) 100 MG tablet Take 1 tablet (100 mg total) by mouth 2 (two) times daily.  180 tablet  3  . nitroGLYCERIN (NITROSTAT) 0.4 MG SL tablet Place 1 tablet (0.4 mg total) under the tongue every 5 (five) minutes as needed for chest pain.  30 tablet  3  . ONE TOUCH ULTRA TEST test strip TEST TWICE A DAY  100 each  1  . simvastatin (ZOCOR) 40 MG tablet Take 1 tablet (40 mg total) by mouth at bedtime.  100 tablet  3  . Testosterone 10 MG/ACT (2%) GEL Place 5 Squirts onto the skin daily.  60 g  5  . varenicline (CHANTIX) 1 MG tablet Take 1 mg by mouth daily.       No current facility-administered medications for this visit.    Allergies:   No Known Allergies  Social History:  The patient  reports that he quit smoking about 3 years ago. His smoking use included Cigarettes. He has a 67.5 pack-year smoking history. He has never used smokeless tobacco. He reports that he drinks about .6 ounces of alcohol per week. He reports that he does not use illicit drugs.   ROS:  Please see the history of present illness.   Denies any bleeding, syncope, orthopnea, PND  PHYSICAL EXAM: VS:  BP 140/71  Pulse 63  Ht 5\' 10"  (1.778 m)  Wt 278 lb 12.8 oz (126.463 kg)  BMI 40.00 kg/m2 Well nourished, well developed, in no acute distress HEENT: normal Neck: no JVD Cardiac:  normal S1, S2; RRR; no murmur Lungs:  clear to auscultation bilaterally, no wheezing, rhonchi or rales Abd: soft, nontender, no hepatomegaly, obese Ext: no edema Skin: warm and dry Neuro: no focal abnormalities noted  EKG:  None today Nuclear stress 12/14: Intermediate risk, inferolateral reversible defect. Cardiac catheterization 12/04/13-proximal RCA, PLA disease. Status post DES to both regions.  Labs: 05/09/14: Hemoglobin A1c 8.1   ASSESSMENT AND PLAN:  1. Coronary artery disease-status post percutaneous intervention as described above. RCA region. Overall doing well. No exertional anginal symptoms. Continue  with dual antiplatelet therapy. USE LEFT RADIAL in future.  2. Angina-resolved post percutaneous intervention. Multidrug regimen. NTG rx. Isosorbide. Low-dose beta blocker. Did not wish to participating cardiac rehabilitation because insurance did not cover. 3. Hyperlipidemia-continue with statin therapy. LDL goal less than 70. We'll check lipid profile. On simvastatin 40 mg. 4. Hypertension-currently well controlled. 5. Tobacco use-smoking cessation discussed. He quit for quite some time but restarted and then quit  again. He understands the importance of cessation. 6. Morbid obesity-continue to encourage weight loss. He is very motivated currently. He has tried [employer] as well as [employer] in the past. They both worked but once he gets off of the diet plan, his weight increases. 7. One year followup.  Signed, Candee Furbish, MD [facility]  05/15/2014 9:23 AM

## 2014-05-23 ENCOUNTER — Other Ambulatory Visit: Payer: Self-pay | Admitting: Family Medicine

## 2014-06-13 ENCOUNTER — Telehealth: Payer: Self-pay

## 2014-06-13 ENCOUNTER — Other Ambulatory Visit: Payer: Self-pay | Admitting: Endocrinology

## 2014-06-13 MED ORDER — TESTOSTERONE 10 MG/ACT (2%) TD GEL: 5.0000 | Freq: Every day | TRANSDERMAL | Status: DC

## 2014-06-13 NOTE — Telephone Encounter (Signed)
Rx faxed to pharmacy  

## 2014-06-13 NOTE — Telephone Encounter (Signed)
i printed 

## 2014-06-13 NOTE — Telephone Encounter (Signed)
Received a refill request for Testosterone 10 mg. Pt was last seen on 05/08/2014 and med was last filled on 11/07/2013.  Thanks!

## 2014-06-29 ENCOUNTER — Other Ambulatory Visit: Payer: Self-pay | Admitting: Endocrinology

## 2014-07-09 ENCOUNTER — Other Ambulatory Visit: Payer: Self-pay | Admitting: Family Medicine

## 2014-07-18 ENCOUNTER — Other Ambulatory Visit: Payer: Self-pay | Admitting: Endocrinology

## 2014-07-22 ENCOUNTER — Other Ambulatory Visit: Payer: Self-pay | Admitting: Endocrinology

## 2014-07-23 LAB — HM DIABETES EYE EXAM

## 2014-08-01 ENCOUNTER — Other Ambulatory Visit: Payer: Self-pay | Admitting: Family Medicine

## 2014-08-11 ENCOUNTER — Encounter: Payer: Self-pay | Admitting: Endocrinology

## 2014-08-11 ENCOUNTER — Ambulatory Visit (INDEPENDENT_AMBULATORY_CARE_PROVIDER_SITE_OTHER): Payer: Managed Care, Other (non HMO) | Admitting: Endocrinology

## 2014-08-11 VITALS — BP 118/52 | HR 67 | Temp 98.2°F | Ht 71.0 in | Wt 286.0 lb

## 2014-08-11 DIAGNOSIS — Z23 Encounter for immunization: Secondary | ICD-10-CM

## 2014-08-11 DIAGNOSIS — E1029 Type 1 diabetes mellitus with other diabetic kidney complication: Secondary | ICD-10-CM

## 2014-08-11 LAB — HEMOGLOBIN A1C: Hgb A1c MFr Bld: 8 % — ABNORMAL HIGH (ref 4.6–6.5)

## 2014-08-11 NOTE — Patient Instructions (Addendum)
Please come back for a follow-up appointment in 3 months.   check your blood sugar twice a day.  vary the time of day when you check, between before the 3 meals, and at bedtime.  also check if you have symptoms of your blood sugar being too high or too low.  please keep a record of the readings and bring it to your next appointment here.  You can write it on any piece of paper.  please call us sooner if your blood sugar goes below 70, or if you have a lot of readings over 200.  blood tests are being requested for you today.  We'll contact you with results.   Please let me know if you decide to pursue the weight-loss surgery again, to see if the insurance will pay

## 2014-08-11 NOTE — Progress Notes (Signed)
Subjective:    Patient ID: Bryan Haley, male    DOB: 06-Nov-1951, 63 y.o.   MRN: [id number]  HPI Pt returns for f/u of insulin-requiring DM (dx'ed 2001, when he presented with fatigue; complicated by renal insufficiency and CAD; he has been on insulin since 2009; he has never had pancreatitis, severe hypoglycemia or DKA; ins declined weight-loss surgery; he takes multiple daily injections).  no cbg record, but states cbg's are seldom low.  This happens at hs.  It is highest in the afternoon.   pt states he feels well in general.   Past Medical History  Diagnosis Date  . HYPOGONADISM 06/11/2010  . GOUT 07/11/2007  . HYPERTENSION 07/06/2007  . COLONIC POLYPS, HX OF 10/02/2008  . RENAL INSUFFICIENCY 12/11/2008  . Hyperlipidemia   . Other and unspecified angina pectoris   . Coronary artery disease   . Exertional shortness of breath     "last 2-3 yrs" (12/04/2013)  . OSA (obstructive sleep apnea) 10/08/2009    wears CPAP "more than 1/2 the time"  . Diabetic retinopathy of both eyes     Laser surgery, Dr. Zadie Rhine  . DM W/COMPLICATION NOS, TYPE I 08/09/2007    "dx'd ~ 1994; I say it's type II" (12/04/2013)  . Coronary atherosclerosis of native coronary artery 12/05/2013    Prox RCA, and PLA DES 12/04/13.     Past Surgical History  Procedure Laterality Date  . Colonoscopy    . Carpal tunnel release Left ~ 2004  . Coronary angioplasty with stent placement  12/04/2013    "2 stents"  . Eye examination under anesthesia w/ retinal cryotherapy and retinal laser Bilateral 2005-2010    "for my diabetes" (12/04/2013)    History   Social History  . Marital Status: Married    Spouse Name: N/A    Number of Children: N/A  . Years of Education: N/A   Occupational History  . sales    Social History Main Topics  . Smoking status: Former Smoker -- 1.50 packs/day for 45 years    Types: Cigarettes    Quit date: 10/02/2010  . Smokeless tobacco: Never Used  . Alcohol Use: 0.6 oz/week    1 Shots of  liquor per week  . Drug Use: No  . Sexual Activity: Yes   Other Topics Concern  . Not on file   Social History Narrative   Regular exercise-no   Pt went to seminar to quit smoking    Current Outpatient Prescriptions on File Prior to Visit  Medication Sig Dispense Refill  . allopurinol (ZYLOPRIM) 300 MG tablet Take 1 tablet (300 mg total) by mouth daily.  100 tablet  3  . amLODipine (NORVASC) 10 MG tablet Take 1 tablet (10 mg total) by mouth daily.  100 tablet  3  . aspirin 81 MG chewable tablet Chew 1 tablet (81 mg total) by mouth daily.      . B-D UF III MINI PEN NEEDLES 31G X 5 MM MISC INJECT 1 PEN AS DIRECTED 3 (THREE) TIMES DAILY. USE AS DIRECTED 3X DAILY  100 each  2  . carvedilol (COREG) 3.125 MG tablet Take 3.125 mg by mouth daily.      . CHANTIX CONTINUING MONTH PAK 1 MG tablet TAKE 1 TABLET BY MOUTH EVERY DAY  56 tablet  1  . clopidogrel (PLAVIX) 75 MG tablet Take 1 tablet (75 mg total) by mouth daily with breakfast.  30 tablet  11  . doxazosin (CARDURA) 8 MG tablet  Take 8 mg by mouth at bedtime.      . fluocinonide-emollient (LIDEX-E) 0.05 % cream Apply 1 application topically 2 (two) times daily.  30 g  0  . fluticasone (FLONASE) 50 MCG/ACT nasal spray Place 1 spray into both nostrils daily.      . furosemide (LASIX) 80 MG tablet Take 80 mg by mouth 2 (two) times daily.      . indomethacin (INDOCIN SR) 75 MG CR capsule Take 75 mg by mouth 2 (two) times daily with a meal.      . indomethacin (INDOCIN SR) 75 MG CR capsule TAKE 1 CAPSULE TWICE A DAY WITH FOOD  200 capsule  0  . INS SYRINGE/NEEDLE 1CC/29G (B-D INSULIN SYRINGE) 29G X 1/2" 1 ML MISC Use as directed       . insulin aspart (NOVOLOG) 100 UNIT/ML injection 50 units at breakfast. 20 units at lunch and 40 units at dinner      . isosorbide mononitrate (IMDUR) 30 MG 24 hr tablet Take 1 tablet (30 mg total) by mouth daily.  90 tablet  3  . lisinopril (PRINIVIL,ZESTRIL) 40 MG tablet Take 40 mg by mouth 2 (two) times daily.       Marland Kitchen losartan (COZAAR) 100 MG tablet Take 1 tablet (100 mg total) by mouth 2 (two) times daily.  180 tablet  3  . nitroGLYCERIN (NITROSTAT) 0.4 MG SL tablet Place 1 tablet (0.4 mg total) under the tongue every 5 (five) minutes as needed for chest pain.  30 tablet  3  . NOVOLIN N 100 UNIT/ML injection INJECT 0.5 MLS INTO THE SKIN AT BEDTIME  20 mL  1  . ONE TOUCH ULTRA TEST test strip TEST TWICE A DAY  100 each  1  . simvastatin (ZOCOR) 40 MG tablet Take 1 tablet (40 mg total) by mouth at bedtime.  100 tablet  3  . Testosterone 10 MG/ACT (2%) GEL Place 5 Squirts onto the skin daily.  60 g  5  . varenicline (CHANTIX) 1 MG tablet Take 1 mg by mouth daily.       No current facility-administered medications on file prior to visit.    No Known Allergies  Family History  Problem Relation Age of Onset  . Cancer Neg Hx     No family history of colon cancer  . Colon cancer Neg Hx   . Esophageal cancer Neg Hx   . Stomach cancer Neg Hx   . Rectal cancer Neg Hx   . Diabetes Other   . Hyperlipidemia Other   . Hypertension Other   . Heart disease Father     BP 118/52  Pulse 67  Temp(Src) 98.2 F (36.8 C) (Oral)  Ht 5\' 11"  (1.803 m)  Wt 286 lb (129.729 kg)  BMI 39.91 kg/m2  SpO2 96%    Review of Systems Denies LOC and weight change.    Objective:   Physical Exam VITAL SIGNS:  See vs page GENERAL: no distress Pulses: dorsalis pedis intact bilat.  Feet: no deformity. normal color and temp. Trace bilat leg edema.  Skin: no ulcer on the feet. Spotty hyperpigmentation of the legs. normal color and temp on the feet. Neuro: sensation is intact to touch on the feet.    Lab Results  Component Value Date   HGBA1C 8.0* 08/11/2014       Assessment & Plan:  DM: moderate exacerbation Obesity, not improved: i again advised surgery for ths Noncompliance with cbg recording: I'll work around this  as best I can   Patient is advised the following: Patient Instructions  Please come back  for a follow-up appointment in 3 months.   check your blood sugar twice a day.  vary the time of day when you check, between before the 3 meals, and at bedtime.  also check if you have symptoms of your blood sugar being too high or too low.  please keep a record of the readings and bring it to your next appointment here.  You can write it on any piece of paper.  please call us sooner if your blood sugar goes below 70, or if you have a lot of readings over 200.  blood tests are being requested for you today.  We'll contact you with results.   Please let me know if you decide to pursue the weight-loss surgery again, to see if the insurance will pay  increase the novolog to 50 units at breakfast. 20 units at lunch and 40 units at dinner.

## 2014-08-14 ENCOUNTER — Encounter: Payer: Self-pay | Admitting: Family Medicine

## 2014-08-22 ENCOUNTER — Other Ambulatory Visit: Payer: Self-pay | Admitting: Endocrinology

## 2014-09-03 ENCOUNTER — Other Ambulatory Visit: Payer: Self-pay | Admitting: Family Medicine

## 2014-09-04 ENCOUNTER — Other Ambulatory Visit: Payer: Self-pay | Admitting: Family Medicine

## 2014-09-08 ENCOUNTER — Encounter: Payer: Self-pay | Admitting: Gastroenterology

## 2014-09-21 ENCOUNTER — Other Ambulatory Visit: Payer: Self-pay | Admitting: Family Medicine

## 2014-09-30 ENCOUNTER — Ambulatory Visit (INDEPENDENT_AMBULATORY_CARE_PROVIDER_SITE_OTHER): Payer: Managed Care, Other (non HMO)

## 2014-09-30 ENCOUNTER — Encounter: Payer: Self-pay | Admitting: Podiatry

## 2014-09-30 ENCOUNTER — Ambulatory Visit (INDEPENDENT_AMBULATORY_CARE_PROVIDER_SITE_OTHER): Payer: Managed Care, Other (non HMO) | Admitting: Podiatry

## 2014-09-30 VITALS — BP 166/80 | HR 61 | Resp 16

## 2014-09-30 DIAGNOSIS — M2141 Flat foot [pes planus] (acquired), right foot: Secondary | ICD-10-CM

## 2014-09-30 DIAGNOSIS — M779 Enthesopathy, unspecified: Secondary | ICD-10-CM

## 2014-09-30 DIAGNOSIS — M2142 Flat foot [pes planus] (acquired), left foot: Secondary | ICD-10-CM

## 2014-09-30 NOTE — Progress Notes (Signed)
   Subjective:    Patient ID: Bryan Haley, male    DOB: Oct 03, 1951, 63 y.o.   MRN: [id number]  HPI Comments: "I have a problem with this right foot"  Patient c/o aching medial right foot for several years. His foot is very flat. He's worn orthotics for 3 years. Just more painful now and wants surgery.  Foot Pain      Review of Systems  All other systems reviewed and are negative.      Objective:   Physical Exam        Assessment & Plan:

## 2014-09-30 NOTE — Progress Notes (Signed)
Subjective:     Patient ID: Bryan Haley, male   DOB: [birth year], 63 y.o.   MRN: [id number]  HPIpatient presents stating that he is having a lot of pain in the medial side of his right arch and foot. States it's been going on for years and he is been to several other physicians over that time and has had different types of orthotics made and has tried different exercises with his symptoms no longer under control. He has had diabetes for approximately 23 years and states that it's under very good control with his last A1c being just above 7. He states as long as he is sitting he does not have a lot of pain but if he tries to do any form of walking the pain will present and makes it hard for him to be ambulatory   Review of Systems  All other systems reviewed and are negative.      Objective:   Physical Exam  Constitutional: He is oriented to person, place, and time.  Cardiovascular: Intact distal pulses.   Musculoskeletal: Normal range of motion.  Neurological: He is oriented to person, place, and time.  Skin: Skin is warm.  Nursing note and vitals reviewed.  pulses were noted to be PT +1 over 4 bilateral and DP +2 over 4 bilateral. His digits appeared to be well perfused and I did note normal neurological function. Patient is noted to have significant collapse of the medial longitudinal arch on the right side with moderate equinus condition noted bilateral. He does appear to have quite a bit of discomfort at the posterior tibial insertion into the navicular and is developing skin irritation secondary to walking on the medial side of his foot. 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 I discussed different treatment options including consideration for new type of orthotic along  with possible steroidal injection therapy into the inflamed tendon versus the possibility for AFO bracing therapy versus the possibility for surgery. Patient is most interested in surgery and I discussed that this would be difficult for him at his age and with his long-term history of diabetes and that he would have to get approval from several physicians. Patient seems to want to go this route and I also discussed that he would be nonweightbearing for an extended period of time. He understands this and is willing to undergo this and at this time I am going to refer him to Dr. Jacqualyn Posey for evaluation and to consider what might be the most appropriate surgical treatment for him again versus the thought of aggressive conservative care

## 2014-10-02 ENCOUNTER — Telehealth: Payer: Self-pay | Admitting: Endocrinology

## 2014-10-02 NOTE — Telephone Encounter (Addendum)
Pt advised of note below and voiced understanding. Pt to call back if sugars do not get better.

## 2014-10-02 NOTE — Telephone Encounter (Signed)
Patient stated that his Blood sugar is running high, over 200. Please advise on what to do.

## 2014-10-02 NOTE — Telephone Encounter (Signed)
See below. Contacted pt he sates that for the past 4 weeks his blood sugar has not come down past 200. Readings in the morning, afternoon and evening have consistently been over 200. Pt confirmed that he is taking Novolog 50 units with breakfast, 20 units with lunch, 40 with supper and Novolin 50 units at bedtime. Please advise, Thanks!

## 2014-10-02 NOTE — Telephone Encounter (Signed)
Ok, please increase Novolog to 60 units with breakfast, 30 units with lunch, 50 with supper and Novolin 60 units at bedtime. Please call next week if not better.

## 2014-10-03 ENCOUNTER — Telehealth: Payer: Self-pay | Admitting: Cardiology

## 2014-10-03 ENCOUNTER — Encounter: Payer: Self-pay | Admitting: Podiatry

## 2014-10-03 ENCOUNTER — Ambulatory Visit (INDEPENDENT_AMBULATORY_CARE_PROVIDER_SITE_OTHER): Payer: Managed Care, Other (non HMO) | Admitting: Podiatry

## 2014-10-03 VITALS — BP 153/69 | HR 73 | Resp 16

## 2014-10-03 DIAGNOSIS — M2140 Flat foot [pes planus] (acquired), unspecified foot: Secondary | ICD-10-CM

## 2014-10-03 DIAGNOSIS — R0989 Other specified symptoms and signs involving the circulatory and respiratory systems: Secondary | ICD-10-CM

## 2014-10-03 DIAGNOSIS — M19079 Primary osteoarthritis, unspecified ankle and foot: Secondary | ICD-10-CM

## 2014-10-03 DIAGNOSIS — M129 Arthropathy, unspecified: Secondary | ICD-10-CM

## 2014-10-03 DIAGNOSIS — M79671 Pain in right foot: Secondary | ICD-10-CM

## 2014-10-03 NOTE — Telephone Encounter (Signed)
Will forward to Dr Marlou Porch for review and clearance

## 2014-10-03 NOTE — Telephone Encounter (Signed)
New message     Need clearance to have foot surgery---pt has a fallen arch.  Please fax clearance to [facility].  Pt did not know phone number but doctor is in epic.

## 2014-10-03 NOTE — Telephone Encounter (Signed)
Delay elective surgery until 12/12/2014. That will be one year post drug eluting stent placement. At that time, he may come off of his dual antiplatelet therapy.  Candee Furbish, MD

## 2014-10-07 ENCOUNTER — Encounter: Payer: Self-pay | Admitting: Podiatry

## 2014-10-07 NOTE — Telephone Encounter (Signed)
Pt aware he can not come off of ASA and plavix until after 12/12/2014.  He states understanding and that he has no problem waiting until then to have surgery.  Advised pt to contact us or have his surgeon contact us if and when he needs surgical clearance.

## 2014-10-07 NOTE — Progress Notes (Signed)
Patient ID: Bryan Haley, male   DOB: September 06, 1951, 63 y.o.   MRN: [id number]  Subjective: Bryan Haley, 63 year old male presents the office today for surgical evaluation of right flatfoot deformity. Patient states that he has pain particularly with weightbearing and ambulation and he has difficulty standing for periods of time due to the pain. He has attempted various forms of bracing/orthotics without resolution of symptoms. At this time he is seeking surgical options to help correct his pain and deformity. No other complaints at this time.   Objective: AAO x3, NAD DP/PT pulses palpable bilaterally although PT slightly decreased, CRT less than 3 seconds Protective sensation intact with Simms Weinstein monofilament, vibratory sensation intact, Achilles tendon reflex intact On the right foot there is a rigid flatfoot deformity and decrease in medial arch height. There is mild tenderness on palpation along the course of the posterior tibial tendon and along its insertion into the navicular. Ankle and subtalar joint range of motion without pain and crepitation. Equinus present. There is also decrease in medial arch heigh upon weightbearing on the left lower extremity, but not as severe as on the right.  MMT 5/5, ROM WNL No calf pain, swelling, warmth, erythema  Assessment: 63 year old male with symptomatic flatfoot deformity with arthritic changes.   Plan: -Previous x-rays were reviewed and discussed with the patient. -Conservative vs. Surgical intervention discussed with the patient including all alternatives, risks, complications.  -At this time, the patient states he has tried bracing without resolution of symptoms, and he is inquiring about surgical intervention. I discussed with him in great detail surgical options. At this time, I believe the patient would benefit most from a medial column fusion, gastroc recession, and possible posterior tibial tendon repair. I discussed with him the  post-operative course as well as risks of this type of surgery with include, but not limited to, infection, bleeding, pain, swelling, Need for further surgery, painful or ugly scar, numbness or sensation changes, chronic pain, hardware failure, reoccurrence, transfer lesions, blood clots. Patient understands these risks. -Before proceeding with surgery patient will be cleared his cardiologist, noninvasive vascular testing including ABIs which is ordered today, blood work including an updated HbA1c closer to surgery, and possible clearance from his PCP. Will also likely obtain an MRI prior to surgery.  -Follow-up as needed if he wishes to proceed with the surgery. In the meantime, encouraged to call the office with any questions/concerns/change in symptoms.   *Received notification from his cardiologist to hold off on surgery until 12/12/14.

## 2014-10-08 ENCOUNTER — Other Ambulatory Visit: Payer: Self-pay | Admitting: Endocrinology

## 2014-10-09 ENCOUNTER — Ambulatory Visit (HOSPITAL_COMMUNITY)
Admission: RE | Admit: 2014-10-09 | Discharge: 2014-10-09 | Disposition: A | Discharge status: Home / Self Care | Payer: Managed Care, Other (non HMO) | Source: Ambulatory Visit | Attending: Cardiology | Admitting: Cardiology

## 2014-10-09 DIAGNOSIS — R0989 Other specified symptoms and signs involving the circulatory and respiratory systems: Secondary | ICD-10-CM | POA: Diagnosis present

## 2014-10-09 NOTE — Progress Notes (Signed)
Lower Extremity Arterial Duplex Completed. °Brianna L Mazza,RVT °

## 2014-10-13 ENCOUNTER — Telehealth: Payer: Self-pay | Admitting: Endocrinology

## 2014-10-13 MED ORDER — INSULIN ASPART 100 UNIT/ML FLEXPEN: PEN_INJECTOR | SUBCUTANEOUS | Status: DC

## 2014-10-13 MED ORDER — INSULIN NPH (HUMAN) (ISOPHANE) 100 UNIT/ML ~~LOC~~ SUSP: SUBCUTANEOUS | Status: DC

## 2014-10-13 NOTE — Telephone Encounter (Signed)
Patient states that Dr. Loanne Drilling has increased insulin dosage  Please contact [facility] to increase insulin dosage    Thank you

## 2014-10-13 NOTE — Telephone Encounter (Signed)
Rx for Novolog and Novolin sent to pharmacy.

## 2014-10-20 ENCOUNTER — Telehealth: Payer: Self-pay | Admitting: Podiatry

## 2014-10-20 NOTE — Telephone Encounter (Signed)
I called the patient and discussed the results of the vascular studies. 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 I discussed with the patient that doing a reconstructive surgery is not the best option at this time due to cicurlation. The patient denies any claudication symptoms to his bilateral lower extremities. The patient was inquiring if anything can begin to improve the blood flow. I discussed with him and he can follow-up with Dr. Alvester Chou for further evaluation I discussed with him that even if he were stented or circulation improved, I would not recommend reconstructive surgery at this time. He states that if he has not been to have surgery to his foot, he is not going to undergo any intervention and did not want to follow-up with Dr. Gwenlyn Found. I discussed with him that if he has any claudication symptoms which were discussed with him to call the office or make an appointment with Dr. Alvester Chou.  I discussed other options including AFO/[place] brace.

## 2014-10-28 ENCOUNTER — Other Ambulatory Visit: Payer: Managed Care, Other (non HMO)

## 2014-10-28 ENCOUNTER — Other Ambulatory Visit (INDEPENDENT_AMBULATORY_CARE_PROVIDER_SITE_OTHER): Payer: Managed Care, Other (non HMO)

## 2014-10-28 DIAGNOSIS — E785 Hyperlipidemia, unspecified: Secondary | ICD-10-CM

## 2014-10-28 DIAGNOSIS — Z0000 Encounter for general adult medical examination without abnormal findings: Secondary | ICD-10-CM

## 2014-10-28 LAB — HEPATIC FUNCTION PANEL
ALBUMIN: 4.1 g/dL (ref 3.5–5.2)
ALK PHOS: 82 U/L (ref 39–117)
ALT: 37 U/L (ref 0–53)
AST: 32 U/L (ref 0–37)
Bilirubin, Direct: 0.1 mg/dL (ref 0.0–0.3)
Total Bilirubin: 0.9 mg/dL (ref 0.2–1.2)
Total Protein: 6.5 g/dL (ref 6.0–8.3)

## 2014-10-28 LAB — CBC WITH DIFFERENTIAL/PLATELET
BASOS PCT: 0.3 % (ref 0.0–3.0)
Basophils Absolute: 0 10*3/uL (ref 0.0–0.1)
EOS PCT: 1.2 % (ref 0.0–5.0)
Eosinophils Absolute: 0.1 10*3/uL (ref 0.0–0.7)
HCT: 41.6 % (ref 39.0–52.0)
Hemoglobin: 13.9 g/dL (ref 13.0–17.0)
LYMPHS PCT: 16.7 % (ref 12.0–46.0)
Lymphs Abs: 1.2 10*3/uL (ref 0.7–4.0)
MCHC: 33.4 g/dL (ref 30.0–36.0)
MCV: 93.1 fl (ref 78.0–100.0)
MONO ABS: 0.6 10*3/uL (ref 0.1–1.0)
MONOS PCT: 7.9 % (ref 3.0–12.0)
Neutro Abs: 5.5 10*3/uL (ref 1.4–7.7)
Neutrophils Relative %: 73.9 % (ref 43.0–77.0)
PLATELETS: 138 10*3/uL — AB (ref 150.0–400.0)
RBC: 4.47 Mil/uL (ref 4.22–5.81)
RDW: 13.8 % (ref 11.5–15.5)
WBC: 7.4 10*3/uL (ref 4.0–10.5)

## 2014-10-28 LAB — LIPID PANEL
Cholesterol: 126 mg/dL (ref 0–200)
HDL: 23.1 mg/dL — ABNORMAL LOW (ref >39.00)
NonHDL: 102.9
Total CHOL/HDL Ratio: 5
Triglycerides: 350 mg/dL — ABNORMAL HIGH (ref 0.0–149.0)
VLDL: 70 mg/dL — AB (ref 0.0–40.0)

## 2014-10-28 LAB — MICROALBUMIN / CREATININE URINE RATIO
Creatinine,U: 60.1 mg/dL
Microalb Creat Ratio: 101.6 mg/g — ABNORMAL HIGH (ref 0.0–30.0)
Microalb, Ur: 61.1 mg/dL — ABNORMAL HIGH (ref 0.0–1.9)

## 2014-10-28 LAB — BASIC METABOLIC PANEL
BUN: 22 mg/dL (ref 6–23)
CALCIUM: 8.8 mg/dL (ref 8.4–10.5)
CHLORIDE: 99 meq/L (ref 96–112)
CO2: 29 mEq/L (ref 19–32)
CREATININE: 1.5 mg/dL (ref 0.4–1.5)
GFR: 49.58 mL/min — AB (ref >60.00)
Glucose, Bld: 228 mg/dL — ABNORMAL HIGH (ref 70–99)
Potassium: 3.8 mEq/L (ref 3.5–5.1)
Sodium: 139 mEq/L (ref 135–145)

## 2014-10-28 LAB — POCT URINALYSIS DIPSTICK
Bilirubin, UA: NEGATIVE
Blood, UA: NEGATIVE
KETONES UA: NEGATIVE
LEUKOCYTES UA: NEGATIVE
Nitrite, UA: NEGATIVE
Spec Grav, UA: 1.01
Urobilinogen, UA: 0.2
pH, UA: 5.5

## 2014-10-28 LAB — PSA: PSA: 1.34 ng/mL (ref 0.10–4.00)

## 2014-10-28 LAB — TSH: TSH: 3.69 u[IU]/mL (ref 0.35–4.50)

## 2014-10-28 LAB — HEMOGLOBIN A1C: Hgb A1c MFr Bld: 8.7 % — ABNORMAL HIGH (ref 4.6–6.5)

## 2014-10-28 LAB — LDL CHOLESTEROL, DIRECT: Direct LDL: 44.8 mg/dL

## 2014-10-29 ENCOUNTER — Other Ambulatory Visit: Payer: Managed Care, Other (non HMO)

## 2014-11-04 ENCOUNTER — Other Ambulatory Visit: Payer: Self-pay | Admitting: Family Medicine

## 2014-11-05 ENCOUNTER — Ambulatory Visit (INDEPENDENT_AMBULATORY_CARE_PROVIDER_SITE_OTHER): Payer: Managed Care, Other (non HMO) | Admitting: Family Medicine

## 2014-11-05 ENCOUNTER — Encounter: Payer: Self-pay | Admitting: Family Medicine

## 2014-11-05 VITALS — BP 120/78 | Temp 98.1°F | Ht 70.0 in | Wt 288.0 lb

## 2014-11-05 DIAGNOSIS — E785 Hyperlipidemia, unspecified: Secondary | ICD-10-CM

## 2014-11-05 DIAGNOSIS — N259 Disorder resulting from impaired renal tubular function, unspecified: Secondary | ICD-10-CM

## 2014-11-05 DIAGNOSIS — M10079 Idiopathic gout, unspecified ankle and foot: Secondary | ICD-10-CM

## 2014-11-05 DIAGNOSIS — Z720 Tobacco use: Secondary | ICD-10-CM

## 2014-11-05 DIAGNOSIS — F17200 Nicotine dependence, unspecified, uncomplicated: Secondary | ICD-10-CM

## 2014-11-05 DIAGNOSIS — M109 Gout, unspecified: Secondary | ICD-10-CM

## 2014-11-05 DIAGNOSIS — I10 Essential (primary) hypertension: Secondary | ICD-10-CM

## 2014-11-05 MED ORDER — FUROSEMIDE 80 MG PO TABS: 80.0000 mg | ORAL_TABLET | Freq: Two times a day (BID) | ORAL | Status: DC

## 2014-11-05 MED ORDER — CARVEDILOL 3.125 MG PO TABS: 3.1250 mg | ORAL_TABLET | Freq: Every day | ORAL | Status: DC

## 2014-11-05 MED ORDER — ALLOPURINOL 300 MG PO TABS: 300.0000 mg | ORAL_TABLET | Freq: Every day | ORAL | Status: DC

## 2014-11-05 MED ORDER — FLUTICASONE PROPIONATE 50 MCG/ACT NA SUSP: 1.0000 | Freq: Every day | NASAL | Status: DC

## 2014-11-05 MED ORDER — LISINOPRIL 40 MG PO TABS: 40.0000 mg | ORAL_TABLET | Freq: Two times a day (BID) | ORAL | Status: DC

## 2014-11-05 MED ORDER — AMLODIPINE BESYLATE 10 MG PO TABS: 10.0000 mg | ORAL_TABLET | Freq: Every day | ORAL | Status: DC

## 2014-11-05 MED ORDER — CLOPIDOGREL BISULFATE 75 MG PO TABS: 75.0000 mg | ORAL_TABLET | Freq: Every day | ORAL | Status: DC

## 2014-11-05 MED ORDER — SIMVASTATIN 40 MG PO TABS: 40.0000 mg | ORAL_TABLET | Freq: Every day | ORAL | Status: DC

## 2014-11-05 MED ORDER — INDOMETHACIN ER 75 MG PO CPCR: 75.0000 mg | ORAL_CAPSULE | Freq: Two times a day (BID) | ORAL | Status: DC

## 2014-11-05 MED ORDER — DOXAZOSIN MESYLATE 8 MG PO TABS: 8.0000 mg | ORAL_TABLET | Freq: Every day | ORAL | Status: DC

## 2014-11-05 MED ORDER — LOSARTAN POTASSIUM 100 MG PO TABS: 100.0000 mg | ORAL_TABLET | Freq: Two times a day (BID) | ORAL | Status: DC

## 2014-11-05 MED ORDER — VARENICLINE TARTRATE 1 MG PO TABS: ORAL_TABLET | ORAL | Status: DC

## 2014-11-05 NOTE — Progress Notes (Signed)
   Subjective:    Patient ID: Bryan Haley, male    DOB: 1951-03-23, 63 y.o.   MRN: [id number]  HPI Bryan Haley is a 63 year old married male who has the classic metabolic syndrome,,, obesity, hyperlipidemia, hypertension,, diabetes,,,,, coronary artery disease,,,, and tobacco abuse  We've worked hard with him over the last couple months to get him off nicotine completely. He is on Chantix 0.5 mg every  A.m. And tells me has not smoked a cigarette in 6 weeks. Bryan Haley keep him on the Chantix on a long-term basis. His med list reviewed the been no changes. He's followed in cardiology because his underlying coronary artery disease  He's followed in endocrinology because of his diabetes and erectile dysfunction.  Vaccinations updated by Bryan Haley   Review of Systems  Constitutional: Negative.   HENT: Negative.   Eyes: Negative.   Respiratory: Negative.   Cardiovascular: Negative.   Gastrointestinal: Negative.   Endocrine: Negative.   Genitourinary: Negative.   Musculoskeletal: Negative.   Skin: Negative.   Allergic/Immunologic: Negative.   Neurological: Negative.   Hematological: Negative.   Psychiatric/Behavioral: Negative.        Objective:   Physical Exam  Constitutional: He is oriented to person, place, and time. He appears well-developed and well-nourished.  HENT:  Head: Normocephalic and atraumatic.  Right Ear: External ear normal.  Left Ear: External ear normal.  Nose: Nose normal.  Mouth/Throat: Oropharynx is clear and moist.  Eyes: Conjunctivae and EOM are normal. Pupils are equal, round, and reactive to light.  Neck: Normal range of motion. Neck supple. No JVD present. No tracheal deviation present. No thyromegaly present.  Cardiovascular: Normal rate, regular rhythm, normal heart sounds and intact distal pulses.  Exam reveals no gallop and no friction rub.   No murmur heard. No carotid nor aortic bruits peripheral pulses 1+ and symmetrical  Pulmonary/Chest: Effort  normal and breath sounds normal. No stridor. No respiratory distress. He has no wheezes. He has no rales. He exhibits no tenderness.  Abdominal: Soft. Bowel sounds are normal. He exhibits no distension and no mass. There is no tenderness. There is no rebound and no guarding.  Genitourinary: Rectum normal and penis normal. Guaiac negative stool. No penile tenderness.  1+ symmetrical nonnodular BPH  Musculoskeletal: Normal range of motion. He exhibits no edema or tenderness.  Lymphadenopathy:    He has no cervical adenopathy.  Neurological: He is alert and oriented to person, place, and time. He has normal reflexes. No cranial nerve deficit. He exhibits normal muscle tone.  Skin: Skin is warm and dry. No rash noted. No erythema. No pallor.  Total body skin exam normal except for a red ulcerated lesion on his right scapula advised return ASAP for removal also 2 lesions on his neck that appear abnormal and one on his face.  Psychiatric: He has a normal mood and affect. His behavior is normal. Judgment and thought content normal.  Nursing note and vitals reviewed.         Assessment & Plan:  Metabolic syndrome........ continue current therapy  Tobacco abuse........ continue Chantix one half tab daily 3 months then taper slowly  3 abnormal lesions..... One on his back, one on his neck, one on his face, .......Marland Kitchen return next week for removal

## 2014-11-05 NOTE — Progress Notes (Signed)
Pre visit review using our clinic review tool, if applicable. No additional management support is needed unless otherwise documented below in the visit note. Lab Results  Component Value Date   HGBA1C 8.7* 10/28/2014   HGBA1C 8.0* 08/11/2014   HGBA1C 8.1* 05/09/2014   Lab Results  Component Value Date   MICROALBUR 61.1* 10/28/2014   LDLCALC 34 09/10/2010   CREATININE 1.5 10/28/2014

## 2014-11-05 NOTE — Patient Instructions (Signed)
Continue your current medications  Continue close follow-up with endocrinology and cardiology  Walk 15 minutes twice daily  Return next Tuesday 30 minute appointment for removal of the lesions we discussed  Chantix one half tab daily for 2 months then begin to taper by taking a half a tablet Monday Wednesday Friday for 2 months

## 2014-11-06 ENCOUNTER — Encounter (HOSPITAL_COMMUNITY): Payer: Self-pay | Admitting: Cardiology

## 2014-11-10 ENCOUNTER — Encounter: Payer: Self-pay | Admitting: Endocrinology

## 2014-11-10 ENCOUNTER — Ambulatory Visit (INDEPENDENT_AMBULATORY_CARE_PROVIDER_SITE_OTHER): Payer: Managed Care, Other (non HMO) | Admitting: Endocrinology

## 2014-11-10 VITALS — BP 138/84 | HR 65 | Temp 98.6°F | Ht 70.0 in | Wt 288.0 lb

## 2014-11-10 DIAGNOSIS — IMO0002 Reserved for concepts with insufficient information to code with codable children: Secondary | ICD-10-CM

## 2014-11-10 DIAGNOSIS — E1065 Type 1 diabetes mellitus with hyperglycemia: Secondary | ICD-10-CM

## 2014-11-10 NOTE — Progress Notes (Signed)
Subjective:    Patient ID: Bryan Haley, male    DOB: 07-11-1951, 63 y.o.   MRN: [id number]  HPI  Pt returns for f/u of diabetes mellitus: DM type: Insulin-requiring type 2 Dx'ed: 3557 Complications: renal insufficiency and CAD Therapy: insulin since 2009 DKA: never Severe hypoglycemia: never Pancreatitis: never Other: insurance declined weight-loss surgery; he takes multiple daily injections Interval history: no cbg record, but states cbg's are seldom low.  This happens at in the afternoon.  It is highest in AM.   pt states he feels well in general.    Past Medical History  Diagnosis Date  . HYPOGONADISM 06/11/2010  . GOUT 07/11/2007  . HYPERTENSION 07/06/2007  . COLONIC POLYPS, HX OF 10/02/2008  . RENAL INSUFFICIENCY 12/11/2008  . Hyperlipidemia   . Other and unspecified angina pectoris   . Coronary artery disease   . Exertional shortness of breath     "last 2-3 yrs" (12/04/2013)  . OSA (obstructive sleep apnea) 10/08/2009    wears CPAP "more than 1/2 the time"  . Diabetic retinopathy of both eyes     Laser surgery, Dr. Zadie Rhine  . DM W/COMPLICATION NOS, TYPE I 08/09/2007    "dx'd ~ 1994; I say it's type II" (12/04/2013)  . Coronary atherosclerosis of native coronary artery 12/05/2013    Prox RCA, and PLA DES 12/04/13.     Past Surgical History  Procedure Laterality Date  . Colonoscopy    . Carpal tunnel release Left ~ 2004  . Coronary angioplasty with stent placement  12/04/2013    "2 stents"  . Eye examination under anesthesia w/ retinal cryotherapy and retinal laser Bilateral 2005-2010    "for my diabetes" (12/04/2013)  . Left heart catheterization with coronary angiogram Bilateral 12/04/2013    Procedure: LEFT HEART CATHETERIZATION WITH CORONARY ANGIOGRAM;  Surgeon: Candee Furbish, MD;  Location: [facility] CATH LAB;  Service: Cardiovascular;  Laterality: Bilateral;    History   Social History  . Marital Status: Married    Spouse Name: N/A    Number of Children: N/A  . Years of  Education: N/A   Occupational History  . sales    Social History Main Topics  . Smoking status: Former Smoker -- 1.50 packs/day for 45 years    Types: Cigarettes    Quit date: 10/02/2010  . Smokeless tobacco: Never Used  . Alcohol Use: 0.6 oz/week    1 Shots of liquor per week  . Drug Use: No  . Sexual Activity: Yes   Other Topics Concern  . Not on file   Social History Narrative   Regular exercise-no   Pt went to seminar to quit smoking    Current Outpatient Prescriptions on File Prior to Visit  Medication Sig Dispense Refill  . allopurinol (ZYLOPRIM) 300 MG tablet Take 1 tablet (300 mg total) by mouth daily. 100 tablet 3  . amLODipine (NORVASC) 10 MG tablet Take 1 tablet (10 mg total) by mouth daily. 100 tablet 3  . aspirin 81 MG chewable tablet Chew 1 tablet (81 mg total) by mouth daily.    . B-D UF III MINI PEN NEEDLES 31G X 5 MM MISC INJECT 1 PEN AS DIRECTED 3 (THREE) TIMES DAILY. USE AS DIRECTED 3X DAILY 100 each 2  . carvedilol (COREG) 3.125 MG tablet Take 1 tablet (3.125 mg total) by mouth daily. 100 tablet 3  . CHANTIX CONTINUING MONTH PAK 1 MG tablet TAKE 1 TABLET BY MOUTH EVERY DAY 56 tablet 1  . clopidogrel (  PLAVIX) 75 MG tablet Take 1 tablet (75 mg total) by mouth daily with breakfast. 100 tablet 3  . doxazosin (CARDURA) 8 MG tablet Take 1 tablet (8 mg total) by mouth at bedtime. 100 tablet 3  . fluocinonide-emollient (LIDEX-E) 0.05 % cream Apply 1 application topically 2 (two) times daily. 30 g 0  . fluticasone (FLONASE) 50 MCG/ACT nasal spray Place 1 spray into both nostrils daily. 16 g 11  . furosemide (LASIX) 80 MG tablet Take 1 tablet (80 mg total) by mouth 2 (two) times daily. 200 tablet 3  . indomethacin (INDOCIN SR) 75 MG CR capsule Take 1 capsule (75 mg total) by mouth 2 (two) times daily. 200 capsule 0  . INS SYRINGE/NEEDLE 1CC/29G (B-D INSULIN SYRINGE) 29G X 1/2" 1 ML MISC Use as directed     . insulin aspart (NOVOLOG FLEXPEN) 100 UNIT/ML FlexPen Inject  60 units at breakfast, 30 units at lunch, and 50 units at supper (Patient taking differently: Inject 60 units at breakfast, 20 units at lunch, and 50 units at supper) 45 mL 2  . insulin NPH Human (NOVOLIN N) 100 UNIT/ML injection Inject 60 units daily (Patient taking differently: Inject 70 Units into the skin at bedtime. ) 20 mL 1  . isosorbide mononitrate (IMDUR) 30 MG 24 hr tablet Take 1 tablet (30 mg total) by mouth daily. 90 tablet 3  . lisinopril (PRINIVIL,ZESTRIL) 40 MG tablet Take 1 tablet (40 mg total) by mouth 2 (two) times daily. 200 tablet 3  . losartan (COZAAR) 100 MG tablet TAKE 1 TABLET TWICE A DAY 200 tablet 2  . losartan (COZAAR) 100 MG tablet Take 1 tablet (100 mg total) by mouth 2 (two) times daily. 180 tablet 3  . nitroGLYCERIN (NITROSTAT) 0.4 MG SL tablet Place 1 tablet (0.4 mg total) under the tongue every 5 (five) minutes as needed for chest pain. 30 tablet 3  . ONE TOUCH ULTRA TEST test strip TEST TWICE A DAY 100 each 1  . simvastatin (ZOCOR) 40 MG tablet Take 1 tablet (40 mg total) by mouth at bedtime. 100 tablet 2  . Testosterone 10 MG/ACT (2%) GEL Place 5 Squirts onto the skin daily. 60 g 5  . varenicline (CHANTIX) 1 MG tablet One half tab every morning 30 tablet 5   No current facility-administered medications on file prior to visit.    No Known Allergies  Family History  Problem Relation Age of Onset  . Cancer Neg Hx     No family history of colon cancer  . Colon cancer Neg Hx   . Esophageal cancer Neg Hx   . Stomach cancer Neg Hx   . Rectal cancer Neg Hx   . Diabetes Other   . Hyperlipidemia Other   . Hypertension Other   . Heart disease Father     BP 138/84 mmHg  Pulse 65  Temp(Src) 98.6 F (37 C) (Oral)  Ht 5\' 10"  (1.778 m)  Wt 288 lb (130.636 kg)  BMI 41.32 kg/m2  SpO2 96%      Review of Systems Denies LOC.  He has gained weight.    Objective:   Physical Exam VITAL SIGNS:  See vs page GENERAL: no distress Pulses: dorsalis pedis intact  bilat.   Feet: no deformity.  Trace bilat leg edema.   Skin:  no ulcer on the feet.  normal color and temp. Neuro: sensation is intact to touch on the feet   Lab Results  Component Value Date   HGBA1C 8.7* 10/28/2014  Assessment & Plan:  DM: moderate exacerbation.   Noncompliance with cbg recording: I'll work around this as best I can.    Patient is advised the following: Patient Instructions  Please come back for a follow-up appointment in 3 months.   check your blood sugar twice a day.  vary the time of day when you check, between before the 3 meals, and at bedtime.  also check if you have symptoms of your blood sugar being too high or too low.  please keep a record of the readings and bring it to your next appointment here.  You can write it on any piece of paper.  please call us sooner if your blood sugar goes below 70, or if you have a lot of readings over 200.  blood tests are being requested for you today.  We'll contact you with results.  PIease decrease the novolog to 60 units at breakfast, 20 units at lunch, and 50 units at supper, and: Increase the NPH to 70 units at bedtime.

## 2014-11-10 NOTE — Patient Instructions (Addendum)
Please come back for a follow-up appointment in 3 months.   check your blood sugar twice a day.  vary the time of day when you check, between before the 3 meals, and at bedtime.  also check if you have symptoms of your blood sugar being too high or too low.  please keep a record of the readings and bring it to your next appointment here.  You can write it on any piece of paper.  please call us sooner if your blood sugar goes below 70, or if you have a lot of readings over 200.  blood tests are being requested for you today.  We'll contact you with results.  PIease decrease the novolog to 60 units at breakfast, 20 units at lunch, and 50 units at supper, and: Increase the NPH to 70 units at bedtime.

## 2014-11-11 ENCOUNTER — Ambulatory Visit (INDEPENDENT_AMBULATORY_CARE_PROVIDER_SITE_OTHER): Payer: Managed Care, Other (non HMO) | Admitting: Family Medicine

## 2014-11-11 ENCOUNTER — Encounter: Payer: Self-pay | Admitting: Family Medicine

## 2014-11-11 DIAGNOSIS — L989 Disorder of the skin and subcutaneous tissue, unspecified: Secondary | ICD-10-CM

## 2014-11-16 ENCOUNTER — Other Ambulatory Visit: Payer: Self-pay | Admitting: Cardiology

## 2014-11-19 ENCOUNTER — Other Ambulatory Visit: Payer: Self-pay | Admitting: Cardiology

## 2014-11-19 ENCOUNTER — Other Ambulatory Visit: Payer: Self-pay | Admitting: Family Medicine

## 2014-11-24 ENCOUNTER — Encounter: Payer: Self-pay | Admitting: Cardiology

## 2014-12-04 ENCOUNTER — Other Ambulatory Visit: Payer: Self-pay | Admitting: Family Medicine

## 2014-12-06 ENCOUNTER — Other Ambulatory Visit: Payer: Self-pay | Admitting: Endocrinology

## 2014-12-19 ENCOUNTER — Other Ambulatory Visit: Payer: Self-pay | Admitting: Family Medicine

## 2014-12-22 ENCOUNTER — Encounter (HOSPITAL_COMMUNITY): Payer: Self-pay | Admitting: Emergency Medicine

## 2014-12-22 ENCOUNTER — Telehealth: Payer: Self-pay | Admitting: Family Medicine

## 2014-12-22 ENCOUNTER — Observation Stay (HOSPITAL_COMMUNITY): Payer: Managed Care, Other (non HMO)

## 2014-12-22 ENCOUNTER — Inpatient Hospital Stay (HOSPITAL_COMMUNITY)
Admission: EM | Admit: 2014-12-22 | Discharge: 2014-12-24 | DRG: 683 | Disposition: A | Discharge status: Home / Self Care | Payer: Managed Care, Other (non HMO) | Attending: Internal Medicine | Admitting: Internal Medicine

## 2014-12-22 DIAGNOSIS — N179 Acute kidney failure, unspecified: Principal | ICD-10-CM | POA: Diagnosis present

## 2014-12-22 DIAGNOSIS — I2510 Atherosclerotic heart disease of native coronary artery without angina pectoris: Secondary | ICD-10-CM | POA: Diagnosis present

## 2014-12-22 DIAGNOSIS — I952 Hypotension due to drugs: Secondary | ICD-10-CM | POA: Diagnosis present

## 2014-12-22 DIAGNOSIS — Z955 Presence of coronary angioplasty implant and graft: Secondary | ICD-10-CM

## 2014-12-22 DIAGNOSIS — E291 Testicular hypofunction: Secondary | ICD-10-CM | POA: Diagnosis present

## 2014-12-22 DIAGNOSIS — M109 Gout, unspecified: Secondary | ICD-10-CM | POA: Diagnosis present

## 2014-12-22 DIAGNOSIS — E785 Hyperlipidemia, unspecified: Secondary | ICD-10-CM | POA: Diagnosis present

## 2014-12-22 DIAGNOSIS — Z86010 Personal history of colonic polyps: Secondary | ICD-10-CM

## 2014-12-22 DIAGNOSIS — I10 Essential (primary) hypertension: Secondary | ICD-10-CM

## 2014-12-22 DIAGNOSIS — N184 Chronic kidney disease, stage 4 (severe): Secondary | ICD-10-CM | POA: Diagnosis present

## 2014-12-22 DIAGNOSIS — E10319 Type 1 diabetes mellitus with unspecified diabetic retinopathy without macular edema: Secondary | ICD-10-CM | POA: Diagnosis present

## 2014-12-22 DIAGNOSIS — Z87891 Personal history of nicotine dependence: Secondary | ICD-10-CM

## 2014-12-22 DIAGNOSIS — E1159 Type 2 diabetes mellitus with other circulatory complications: Secondary | ICD-10-CM | POA: Diagnosis present

## 2014-12-22 DIAGNOSIS — E1169 Type 2 diabetes mellitus with other specified complication: Secondary | ICD-10-CM | POA: Diagnosis present

## 2014-12-22 DIAGNOSIS — E871 Hypo-osmolality and hyponatremia: Secondary | ICD-10-CM

## 2014-12-22 DIAGNOSIS — N189 Chronic kidney disease, unspecified: Secondary | ICD-10-CM

## 2014-12-22 DIAGNOSIS — Z7982 Long term (current) use of aspirin: Secondary | ICD-10-CM

## 2014-12-22 DIAGNOSIS — R531 Weakness: Secondary | ICD-10-CM | POA: Diagnosis not present

## 2014-12-22 DIAGNOSIS — E1065 Type 1 diabetes mellitus with hyperglycemia: Secondary | ICD-10-CM | POA: Diagnosis present

## 2014-12-22 DIAGNOSIS — Z794 Long term (current) use of insulin: Secondary | ICD-10-CM

## 2014-12-22 DIAGNOSIS — I129 Hypertensive chronic kidney disease with stage 1 through stage 4 chronic kidney disease, or unspecified chronic kidney disease: Secondary | ICD-10-CM | POA: Diagnosis present

## 2014-12-22 DIAGNOSIS — G4733 Obstructive sleep apnea (adult) (pediatric): Secondary | ICD-10-CM | POA: Diagnosis present

## 2014-12-22 LAB — CBC WITH DIFFERENTIAL/PLATELET
BASOS ABS: 0 10*3/uL (ref 0.0–0.1)
Basophils Relative: 0 % (ref 0–1)
EOS PCT: 2 % (ref 0–5)
Eosinophils Absolute: 0.2 10*3/uL (ref 0.0–0.7)
HEMATOCRIT: 39.8 % (ref 39.0–52.0)
HEMOGLOBIN: 13.7 g/dL (ref 13.0–17.0)
LYMPHS PCT: 19 % (ref 12–46)
Lymphs Abs: 1.5 10*3/uL (ref 0.7–4.0)
MCH: 30.8 pg (ref 26.0–34.0)
MCHC: 34.4 g/dL (ref 30.0–36.0)
MCV: 89.4 fL (ref 78.0–100.0)
Monocytes Absolute: 0.5 10*3/uL (ref 0.1–1.0)
Monocytes Relative: 7 % (ref 3–12)
Neutro Abs: 5.6 10*3/uL (ref 1.7–7.7)
Neutrophils Relative %: 72 % (ref 43–77)
Platelets: 144 10*3/uL — ABNORMAL LOW (ref 150–400)
RBC: 4.45 MIL/uL (ref 4.22–5.81)
RDW: 12.9 % (ref 11.5–15.5)
WBC: 7.9 10*3/uL (ref 4.0–10.5)

## 2014-12-22 LAB — BASIC METABOLIC PANEL
ANION GAP: 13 (ref 5–15)
Anion gap: 13 (ref 5–15)
BUN: 88 mg/dL — ABNORMAL HIGH (ref 6–23)
BUN: 85 mg/dL — ABNORMAL HIGH (ref 6–23)
CHLORIDE: 92 mmol/L — AB (ref 96–112)
CO2: 21 mmol/L (ref 19–32)
CO2: 22 mmol/L (ref 19–32)
Calcium: 9.6 mg/dL (ref 8.4–10.5)
Calcium: 9.7 mg/dL (ref 8.4–10.5)
Chloride: 95 mmol/L — ABNORMAL LOW (ref 96–112)
Creatinine, Ser: 3.13 mg/dL — ABNORMAL HIGH (ref 0.50–1.35)
Creatinine, Ser: 3.01 mg/dL — ABNORMAL HIGH (ref 0.50–1.35)
GFR calc Af Amer: 23 mL/min — ABNORMAL LOW (ref >90)
GFR calc non Af Amer: 20 mL/min — ABNORMAL LOW (ref >90)
GFR, EST AFRICAN AMERICAN: 24 mL/min — AB (ref >90)
GFR, EST NON AFRICAN AMERICAN: 20 mL/min — AB (ref >90)
GLUCOSE: 125 mg/dL — AB (ref 70–99)
Glucose, Bld: 170 mg/dL — ABNORMAL HIGH (ref 70–99)
POTASSIUM: 5 mmol/L (ref 3.5–5.1)
POTASSIUM: 4.9 mmol/L (ref 3.5–5.1)
Sodium: 126 mmol/L — ABNORMAL LOW (ref 135–145)
Sodium: 130 mmol/L — ABNORMAL LOW (ref 135–145)

## 2014-12-22 LAB — GLUCOSE, CAPILLARY
Glucose-Capillary: 104 mg/dL — ABNORMAL HIGH (ref 70–99)
Glucose-Capillary: 176 mg/dL — ABNORMAL HIGH (ref 70–99)

## 2014-12-22 MED ORDER — ONDANSETRON HCL 4 MG PO TABS: 4.0000 mg | ORAL_TABLET | Freq: Four times a day (QID) | ORAL | Status: DC | PRN

## 2014-12-22 MED ORDER — ALLOPURINOL 300 MG PO TABS
300.0000 mg | ORAL_TABLET | Freq: Every day | ORAL | Status: DC
  Administered 2014-12-23 – 2014-12-24 (×2): 300 mg via ORAL
  Filled 2014-12-22 (×2): qty 1

## 2014-12-22 MED ORDER — ONDANSETRON HCL 4 MG/2ML IJ SOLN: 4.0000 mg | Freq: Four times a day (QID) | INTRAMUSCULAR | Status: DC | PRN

## 2014-12-22 MED ORDER — CARVEDILOL 3.125 MG PO TABS
3.1250 mg | ORAL_TABLET | Freq: Every day | ORAL | Status: DC
  Administered 2014-12-24: 3.125 mg via ORAL
  Filled 2014-12-22 (×2): qty 1

## 2014-12-22 MED ORDER — ACETAMINOPHEN 325 MG PO TABS: 650.0000 mg | ORAL_TABLET | Freq: Four times a day (QID) | ORAL | Status: DC | PRN

## 2014-12-22 MED ORDER — SODIUM CHLORIDE 0.9 % IV SOLN: INTRAVENOUS | Status: DC

## 2014-12-22 MED ORDER — NITROGLYCERIN 0.4 MG SL SUBL: 0.4000 mg | SUBLINGUAL_TABLET | SUBLINGUAL | Status: DC | PRN

## 2014-12-22 MED ORDER — SODIUM CHLORIDE 0.9 % IV SOLN
1000.0000 mL | INTRAVENOUS | Status: DC
  Administered 2014-12-22: 1000 mL via INTRAVENOUS

## 2014-12-22 MED ORDER — SODIUM CHLORIDE 0.9 % IV SOLN
1000.0000 mL | Freq: Once | INTRAVENOUS | Status: AC
  Administered 2014-12-22: 1000 mL via INTRAVENOUS

## 2014-12-22 MED ORDER — INSULIN ASPART 100 UNIT/ML ~~LOC~~ SOLN
0.0000 [IU] | Freq: Three times a day (TID) | SUBCUTANEOUS | Status: DC
  Administered 2014-12-23 (×2): 3 [IU] via SUBCUTANEOUS
  Administered 2014-12-23 – 2014-12-24 (×2): 2 [IU] via SUBCUTANEOUS

## 2014-12-22 MED ORDER — DOXAZOSIN MESYLATE 8 MG PO TABS
8.0000 mg | ORAL_TABLET | Freq: Every day | ORAL | Status: DC
  Filled 2014-12-22 (×3): qty 1

## 2014-12-22 MED ORDER — OXYCODONE HCL 5 MG PO TABS: 5.0000 mg | ORAL_TABLET | ORAL | Status: DC | PRN

## 2014-12-22 MED ORDER — SIMVASTATIN 40 MG PO TABS
40.0000 mg | ORAL_TABLET | Freq: Every day | ORAL | Status: DC
  Administered 2014-12-22 – 2014-12-23 (×2): 40 mg via ORAL
  Filled 2014-12-22 (×3): qty 1

## 2014-12-22 MED ORDER — AMLODIPINE BESYLATE 5 MG PO TABS: 5.0000 mg | ORAL_TABLET | Freq: Every day | ORAL | Status: DC

## 2014-12-22 MED ORDER — ASPIRIN 81 MG PO CHEW
81.0000 mg | CHEWABLE_TABLET | Freq: Every day | ORAL | Status: DC
  Administered 2014-12-23 – 2014-12-24 (×2): 81 mg via ORAL
  Filled 2014-12-22 (×2): qty 1

## 2014-12-22 MED ORDER — CLOPIDOGREL BISULFATE 75 MG PO TABS
75.0000 mg | ORAL_TABLET | Freq: Every day | ORAL | Status: DC
  Administered 2014-12-23 – 2014-12-24 (×2): 75 mg via ORAL
  Filled 2014-12-22 (×4): qty 1

## 2014-12-22 MED ORDER — ISOSORBIDE MONONITRATE ER 30 MG PO TB24
30.0000 mg | ORAL_TABLET | Freq: Every day | ORAL | Status: DC
  Administered 2014-12-24: 30 mg via ORAL
  Filled 2014-12-22 (×2): qty 1

## 2014-12-22 MED ORDER — ACETAMINOPHEN 650 MG RE SUPP: 650.0000 mg | Freq: Four times a day (QID) | RECTAL | Status: DC | PRN

## 2014-12-22 MED ORDER — HEPARIN SODIUM (PORCINE) 5000 UNIT/ML IJ SOLN
5000.0000 [IU] | Freq: Three times a day (TID) | INTRAMUSCULAR | Status: DC
  Administered 2014-12-22 – 2014-12-24 (×5): 5000 [IU] via SUBCUTANEOUS
  Filled 2014-12-22 (×8): qty 1

## 2014-12-22 NOTE — ED Notes (Signed)
Family at bedside. 

## 2014-12-22 NOTE — ED Provider Notes (Addendum)
CSN: [id number]     Arrival date & time 12/22/14  1341 History   First MD Initiated Contact with Patient 12/22/14 1431     Chief Complaint  Patient presents with  . Hypotension   HPI Pt has been running a low blood pressure this past week after starting a new diet.   His blood pressure has been 108/49, 107/50.  He has been feeling lightheaded and has had decreased energy.  No chest pain or shortness of breath.  No blood in the stool.  No fevers or chills.  He called his doctor and was told to come to the ED.   Past Medical History  Diagnosis Date  . HYPOGONADISM 06/11/2010  . GOUT 07/11/2007  . HYPERTENSION 07/06/2007  . COLONIC POLYPS, HX OF 10/02/2008  . RENAL INSUFFICIENCY 12/11/2008  . Hyperlipidemia   . Other and unspecified angina pectoris   . Coronary artery disease   . Exertional shortness of breath     "last 2-3 yrs" (12/04/2013)  . OSA (obstructive sleep apnea) 10/08/2009    wears CPAP "more than 1/2 the time"  . Diabetic retinopathy of both eyes     Laser surgery, Dr. Zadie Rhine  . DM W/COMPLICATION NOS, TYPE I 08/09/2007    "dx'd ~ 1994; I say it's type II" (12/04/2013)  . Coronary atherosclerosis of native coronary artery 12/05/2013    Prox RCA, and PLA DES 12/04/13.    Past Surgical History  Procedure Laterality Date  . Colonoscopy    . Carpal tunnel release Left ~ 2004  . Coronary angioplasty with stent placement  12/04/2013    "2 stents"  . Eye examination under anesthesia w/ retinal cryotherapy and retinal laser Bilateral 2005-2010    "for my diabetes" (12/04/2013)  . Left heart catheterization with coronary angiogram Bilateral 12/04/2013    Procedure: LEFT HEART CATHETERIZATION WITH CORONARY ANGIOGRAM;  Surgeon: Candee Furbish, MD;  Location: [facility] CATH LAB;  Service: Cardiovascular;  Laterality: Bilateral;   Family History  Problem Relation Age of Onset  . Cancer Neg Hx     No family history of colon cancer  . Colon cancer Neg Hx   . Esophageal cancer Neg Hx   . Stomach cancer  Neg Hx   . Rectal cancer Neg Hx   . Diabetes Other   . Hyperlipidemia Other   . Hypertension Other   . Heart disease Father    History  Substance Use Topics  . Smoking status: Former Smoker -- 1.50 packs/day for 45 years    Types: Cigarettes    Quit date: 10/02/2010  . Smokeless tobacco: Never Used  . Alcohol Use: 0.6 oz/week    1 Shots of liquor per week    Review of Systems    Allergies  Review of patient's allergies indicates no known allergies.  Home Medications   Prior to Admission medications   Medication Sig Start Date End Date Taking? Authorizing Provider  allopurinol (ZYLOPRIM) 300 MG tablet Take 1 tablet (300 mg total) by mouth daily. 11/05/14  Yes Dorena Cookey, MD  aspirin 81 MG chewable tablet Chew 1 tablet (81 mg total) by mouth daily. 12/05/13  Yes Brittainy Erie Noe, PA-C  B-D UF III MINI PEN NEEDLES 31G X 5 MM MISC INJECT 1 PEN AS DIRECTED 3 (THREE) TIMES DAILY. USE AS DIRECTED 3X DAILY 07/21/14  Yes Renato Shin, MD  carvedilol (COREG) 3.125 MG tablet Take 1 tablet (3.125 mg total) by mouth daily. 11/05/14  Yes Dorena Cookey, MD  clopidogrel (PLAVIX) 75 MG tablet Take 1 tablet (75 mg total) by mouth daily with breakfast. 11/05/14  Yes Dorena Cookey, MD  doxazosin (CARDURA) 8 MG tablet Take 1 tablet (8 mg total) by mouth at bedtime. 11/05/14  Yes Dorena Cookey, MD  fluticasone (FLONASE) 50 MCG/ACT nasal spray Place 1 spray into both nostrils daily. 11/05/14  Yes Dorena Cookey, MD  furosemide (LASIX) 80 MG tablet Take 1 tablet (80 mg total) by mouth 2 (two) times daily. 11/05/14  Yes Dorena Cookey, MD  indomethacin (INDOCIN SR) 75 MG CR capsule TAKE 1 CAPSULE (75 MG TOTAL) BY MOUTH 2 (TWO) TIMES DAILY. 12/04/14  Yes Dorena Cookey, MD  INS SYRINGE/NEEDLE 1CC/29G (B-D INSULIN SYRINGE) 29G X 1/2" 1 ML MISC Use as directed    Yes Historical Provider, MD  insulin aspart (NOVOLOG FLEXPEN) 100 UNIT/ML FlexPen Inject 60 units at breakfast, 30 units at lunch, and 50 units at  supper Patient taking differently: Inject 60 units at breakfast, 20 units at lunch, and 50 units at supper 10/13/14  Yes Renato Shin, MD  insulin NPH Human (NOVOLIN N) 100 UNIT/ML injection Inject 60 units daily Patient taking differently: Inject 70 Units into the skin at bedtime.  10/13/14  Yes Renato Shin, MD  isosorbide mononitrate (IMDUR) 30 MG 24 hr tablet TAKE 1 TABLET BY MOUTH EVERY DAY 11/19/14  Yes Candee Furbish, MD  lisinopril (PRINIVIL,ZESTRIL) 40 MG tablet Take 1 tablet (40 mg total) by mouth 2 (two) times daily. 11/05/14  Yes Dorena Cookey, MD  losartan (COZAAR) 100 MG tablet TAKE 1 TABLET TWICE A DAY 11/04/14  Yes Dorena Cookey, MD  nitroGLYCERIN (NITROSTAT) 0.4 MG SL tablet Place 1 tablet (0.4 mg total) under the tongue every 5 (five) minutes as needed for chest pain. 12/17/13  Yes Candee Furbish, MD  ONE TOUCH ULTRA TEST test strip USE TO TEST TWICE A DAY 12/21/14  Yes Dorena Cookey, MD  simvastatin (ZOCOR) 40 MG tablet Take 1 tablet (40 mg total) by mouth at bedtime. 11/05/14  Yes Dorena Cookey, MD  Testosterone 10 MG/ACT (2%) GEL APPLY 5 SQUIRTS ONTO THE SKIN DAILY 12/08/14  Yes Renato Shin, MD  varenicline (CHANTIX) 1 MG tablet One half tab every morning 11/05/14  Yes Dorena Cookey, MD  amLODipine (NORVASC) 5 MG tablet Take 1 tablet (5 mg total) by mouth daily. 12/22/14   Dorie Rank, MD  fluocinonide-emollient (LIDEX-E) 0.05 % cream Apply 1 application topically 2 (two) times daily. Patient not taking: Reported on 12/22/2014 11/04/13   Dorena Cookey, MD   BP 111/53 mmHg  Pulse 66  Temp(Src) 98.5 F (36.9 C) (Oral)  Resp 18  Ht 5\' 10"  (1.778 m)  Wt 265 lb (120.203 kg)  BMI 38.02 kg/m2  SpO2 95% Physical Exam  Constitutional: He appears well-developed and well-nourished. No distress.  Obese   HENT:  Head: Normocephalic and atraumatic.  Right Ear: External ear normal.  Left Ear: External ear normal.  Eyes: Conjunctivae are normal. Right eye exhibits no discharge. Left eye  exhibits no discharge. No scleral icterus.  Neck: Neck supple. No tracheal deviation present.  Cardiovascular: Normal rate, regular rhythm and intact distal pulses.   Pulmonary/Chest: Effort normal and breath sounds normal. No stridor. No respiratory distress. He has no wheezes. He has no rales.  Abdominal: Soft. Bowel sounds are normal. He exhibits no distension. There is no tenderness. There is no rebound and no guarding.  Musculoskeletal: He exhibits no  edema or tenderness.  Neurological: He is alert. He has normal strength. No cranial nerve deficit (no facial droop, extraocular movements intact, no slurred speech) or sensory deficit. He exhibits normal muscle tone. He displays no seizure activity. Coordination normal.  Skin: Skin is warm and dry. No rash noted.  Psychiatric: He has a normal mood and affect.  Nursing note and vitals reviewed.   ED Course  Procedures (including critical care time) Labs Review Labs Reviewed  CBC WITH DIFFERENTIAL/PLATELET - Abnormal; Notable for the following:    Platelets 144 (*)    All other components within normal limits  BASIC METABOLIC PANEL - Abnormal; Notable for the following:    Sodium 126 (*)    Chloride 92 (*)    Glucose, Bld 170 (*)    BUN 88 (*)    Creatinine, Ser 3.13 (*)    GFR calc non Af Amer 20 (*)    GFR calc Af Amer 23 (*)    All other components within normal limits     Medications  0.9 %  sodium chloride infusion (not administered)    Followed by  0.9 %  sodium chloride infusion (not administered)    MDM   Final diagnoses:  Hypotension due to drugs    Pt's blood pressure is on the low end.  He is no distress.  No signs of acute infection, anemia.  Pt is in acute renal failure.  Significantly changed from previous levels.  He is on a new diet, not sure if there is a complication associated with that or possible related to his BP meds.  Will start iv fluids.  Hold his losartan.  Admit for further treatment.    Dorie Rank, MD 12/22/14 1630

## 2014-12-22 NOTE — ED Notes (Addendum)
Pt states he started a new diet called Airheart x 3 weeks. Pt states BP has been low for the past week. Pt c/o dizziness at times, denies n/v/d. states he does feel fatigue.

## 2014-12-22 NOTE — ED Notes (Signed)
MD at bedside. 

## 2014-12-22 NOTE — Telephone Encounter (Signed)
Spoke with patient and he chose to go to the ER.

## 2014-12-22 NOTE — ED Notes (Signed)
MD at bedside. ADMITTING MD PRESENT 

## 2014-12-22 NOTE — H&P (Addendum)
[facility] History and Physical  [name] HCW:[id number] DOB: 1951-08-07 DOA: 12/22/2014  Referring physician: Dr. Tomi Bamberger PCP: [name], MD   Chief Complaint: weakness   HPI: Bryan Haley is a 64 y.o. male  Addendum: With history of CAD, CKD, DM type I on insulin who presented to the ED complaining of weakness. The weakness has gone on for several days prior to presentation. Nothing he is aware of made it better or worse. Given persistence of symptoms patient presented to the hospital for further evaluation recommendations. Upon further workup patient was found to have an elevated serum creatinine above his baseline and as such we were consulted for further medical evaluation recommendations.   Review of Systems:  Constitutional:  No weight loss, night sweats, Fevers, chills, + fatigue.  HEENT:  No headaches, Difficulty swallowing,Tooth/dental problems,Sore throat,  No sneezing, itching, ear ache, nasal congestion, post nasal drip,  Cardio-vascular:  No chest pain, Orthopnea, PND, swelling in lower extremities, anasarca, dizziness, palpitations  GI:  No heartburn, indigestion, abdominal pain, nausea, vomiting, diarrhea, change in bowel habits, loss of appetite  Resp:  No shortness of breath with exertion or at rest. No excess mucus, no productive cough, No non-productive cough, No coughing up of blood.No change in color of mucus.No wheezing.No chest wall deformity  Skin:  no rash or lesions.  GU:  no dysuria, change in color of urine, no urgency or frequency. No flank pain.  Musculoskeletal:  No joint pain or swelling. No decreased range of motion. No back pain.  Psych:  No change in mood or affect. No depression or anxiety. No memory loss.   Past Medical History  Diagnosis Date  . HYPOGONADISM 06/11/2010  . GOUT 07/11/2007  . HYPERTENSION 07/06/2007  . COLONIC POLYPS, HX OF 10/02/2008  . RENAL INSUFFICIENCY 12/11/2008  . Hyperlipidemia   . Other and  unspecified angina pectoris   . Coronary artery disease   . Exertional shortness of breath     "last 2-3 yrs" (12/04/2013)  . OSA (obstructive sleep apnea) 10/08/2009    wears CPAP "more than 1/2 the time"  . Diabetic retinopathy of both eyes     Laser surgery, Dr. Zadie Rhine  . DM W/COMPLICATION NOS, TYPE I 08/09/2007    "dx'd ~ 1994; I say it's type II" (12/04/2013)  . Coronary atherosclerosis of native coronary artery 12/05/2013    Prox RCA, and PLA DES 12/04/13.    Past Surgical History  Procedure Laterality Date  . Colonoscopy    . Carpal tunnel release Left ~ 2004  . Coronary angioplasty with stent placement  12/04/2013    "2 stents"  . Eye examination under anesthesia w/ retinal cryotherapy and retinal laser Bilateral 2005-2010    "for my diabetes" (12/04/2013)  . Left heart catheterization with coronary angiogram Bilateral 12/04/2013    Procedure: LEFT HEART CATHETERIZATION WITH CORONARY ANGIOGRAM;  Surgeon: Candee Furbish, MD;  Location: [facility];  Service: Cardiovascular;  Laterality: Bilateral;   Social History:  reports that he quit smoking about 4 years ago. His smoking use included Cigarettes. He has a 67.5 pack-year smoking history. He has never used smokeless tobacco. He reports that he drinks about 0.6 oz of alcohol per week. He reports that he does not use illicit drugs.  No Known Allergies  Family History  Problem Relation Age of Onset  . Cancer Neg Hx     No family history of colon cancer  . Colon cancer Neg Hx   . Esophageal  cancer Neg Hx   . Stomach cancer Neg Hx   . Rectal cancer Neg Hx   . Diabetes Other   . Hyperlipidemia Other   . Hypertension Other   . Heart disease Father     Prior to Admission medications   Medication Sig Start Date End Date Taking? Authorizing Provider  allopurinol (ZYLOPRIM) 300 MG tablet Take 1 tablet (300 mg total) by mouth daily. 11/05/14  Yes Dorena Cookey, MD  aspirin 81 MG chewable tablet Chew 1 tablet (81 mg total) by mouth daily.  12/05/13  Yes Brittainy Erie Noe, PA-C  B-D UF III MINI PEN NEEDLES 31G X 5 MM MISC INJECT 1 PEN AS DIRECTED 3 (THREE) TIMES DAILY. USE AS DIRECTED 3X DAILY 07/21/14  Yes Renato Shin, MD  carvedilol (COREG) 3.125 MG tablet Take 1 tablet (3.125 mg total) by mouth daily. 11/05/14  Yes Dorena Cookey, MD  clopidogrel (PLAVIX) 75 MG tablet Take 1 tablet (75 mg total) by mouth daily with breakfast. 11/05/14  Yes Dorena Cookey, MD  doxazosin (CARDURA) 8 MG tablet Take 1 tablet (8 mg total) by mouth at bedtime. 11/05/14  Yes Dorena Cookey, MD  fluticasone (FLONASE) 50 MCG/ACT nasal spray Place 1 spray into both nostrils daily. 11/05/14  Yes Dorena Cookey, MD  furosemide (LASIX) 80 MG tablet Take 1 tablet (80 mg total) by mouth 2 (two) times daily. 11/05/14  Yes Dorena Cookey, MD  indomethacin (INDOCIN SR) 75 MG CR capsule TAKE 1 CAPSULE (75 MG TOTAL) BY MOUTH 2 (TWO) TIMES DAILY. 12/04/14  Yes Dorena Cookey, MD  INS SYRINGE/NEEDLE 1CC/29G (B-D INSULIN SYRINGE) 29G X 1/2" 1 ML MISC Use as directed    Yes Historical Provider, MD  insulin aspart (NOVOLOG FLEXPEN) 100 UNIT/ML FlexPen Inject 60 units at breakfast, 30 units at lunch, and 50 units at supper Patient taking differently: Inject 60 units at breakfast, 20 units at lunch, and 50 units at supper 10/13/14  Yes Renato Shin, MD  insulin NPH Human (NOVOLIN N) 100 UNIT/ML injection Inject 60 units daily Patient taking differently: Inject 70 Units into the skin at bedtime.  10/13/14  Yes Renato Shin, MD  isosorbide mononitrate (IMDUR) 30 MG 24 hr tablet TAKE 1 TABLET BY MOUTH EVERY DAY 11/19/14  Yes Candee Furbish, MD  lisinopril (PRINIVIL,ZESTRIL) 40 MG tablet Take 1 tablet (40 mg total) by mouth 2 (two) times daily. 11/05/14  Yes Dorena Cookey, MD  losartan (COZAAR) 100 MG tablet TAKE 1 TABLET TWICE A DAY 11/04/14  Yes Dorena Cookey, MD  nitroGLYCERIN (NITROSTAT) 0.4 MG SL tablet Place 1 tablet (0.4 mg total) under the tongue every 5 (five) minutes as needed for  chest pain. 12/17/13  Yes Candee Furbish, MD  ONE TOUCH ULTRA TEST test strip USE TO TEST TWICE A DAY 12/21/14  Yes Dorena Cookey, MD  simvastatin (ZOCOR) 40 MG tablet Take 1 tablet (40 mg total) by mouth at bedtime. 11/05/14  Yes Dorena Cookey, MD  Testosterone 10 MG/ACT (2%) GEL APPLY 5 SQUIRTS ONTO THE SKIN DAILY 12/08/14  Yes Renato Shin, MD  varenicline (CHANTIX) 1 MG tablet One half tab every morning 11/05/14  Yes Dorena Cookey, MD  amLODipine (NORVASC) 5 MG tablet Take 1 tablet (5 mg total) by mouth daily. 12/22/14   Dorie Rank, MD  fluocinonide-emollient (LIDEX-E) 0.05 % cream Apply 1 application topically 2 (two) times daily. Patient not taking: Reported on 12/22/2014 11/04/13   Dorena Cookey,  MD   Physical Exam: Filed Vitals:   12/22/14 1445 12/22/14 1500 12/22/14 1501 12/22/14 1700  BP: 112/58 111/53  120/60  Pulse: 65 66  60  Temp:      TempSrc:      Resp:    18  Height:   5\' 10"  (1.778 m)   Weight:   120.203 kg (265 lb)   SpO2: 88% 95%  99%    Wt Readings from Last 3 Encounters:  12/22/14 120.203 kg (265 lb)  11/10/14 130.636 kg (288 lb)  11/05/14 130.636 kg (288 lb)    General:  Appears calm and comfortable Eyes: PERRL, normal lids, irises & conjunctiva ENT: grossly normal hearing, lips & tongue Neck: no LAD, masses or thyromegaly Cardiovascular: RRR, no m/r/g. No LE edema. Respiratory: CTA bilaterally, no w/r/r. Normal respiratory effort. Abdomen: soft, nt,nd Skin: no rash or induration seen on limited exam Musculoskeletal: grossly normal tone BUE/BLE Psychiatric: grossly normal mood and affect, speech fluent and appropriate Neurologic: grossly non-focal.          Labs on Admission:  Basic Metabolic Panel:  Recent Labs Lab 12/22/14 1534  NA 126*  K 5.0  CL 92*  CO2 21  GLUCOSE 170*  BUN 88*  CREATININE 3.13*  CALCIUM 9.6   Liver Function Tests: No results for input(s): AST, ALT, ALKPHOS, BILITOT, PROT, ALBUMIN in the last 168 hours. No results for  input(s): LIPASE, AMYLASE in the last 168 hours. No results for input(s): AMMONIA in the last 168 hours. CBC:  Recent Labs Lab 12/22/14 1534  WBC 7.9  NEUTROABS 5.6  HGB 13.7  HCT 39.8  MCV 89.4  PLT 144*   Cardiac Enzymes: No results for input(s): CKTOTAL, CKMB, CKMBINDEX, TROPONINI in the last 168 hours.  BNP (last 3 results) No results for input(s): PROBNP in the last 8760 hours. CBG: No results for input(s): GLUCAP in the last 168 hours.  Radiological Exams on Admission: No results found.   Assessment/Plan Principal problem  Acute on Chronic Renal Failure - The patient has history of chronic kidney disease. 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

## 2014-12-22 NOTE — Telephone Encounter (Signed)
Patient Name: Bryan Haley DOB: 1951-06-05 Initial Comment caller states his BP is running 108/49 Nurse Assessment Nurse: Vallery Sa, RN, Cathy Date/Time (Eastern Time): 12/22/2014 9:09:36 AM Confirm and document reason for call. If symptomatic, describe symptoms. ---Caller states his blood pressure reading was 105/50 this morning. No difficulty breathing. Started feeling more tired than usual 1-2 weeks ago. Has the patient traveled out of the country within the last 30 days? ---No Does the patient require triage? ---Yes Related visit to physician within the last 2 weeks? ---No Does the PT have any chronic conditions? (i.e. diabetes, asthma, etc.) ---Yes List chronic conditions. ---High Blood Pressure (took his medication this morning), Diabetes, Cardiac Stents placed last year (no chest pain) Guidelines Guideline Title Affirmed Question Affirmed Notes Low Blood Pressure [0] Fall in systolic BP > 20 mm Hg from normal AND [2] dizzy, lightheaded, or weak Final Disposition User Go to ED Now (or PCP triage) Vallery Sa, RN, [employer]

## 2014-12-22 NOTE — Telephone Encounter (Signed)
FYI

## 2014-12-23 DIAGNOSIS — N179 Acute kidney failure, unspecified: Secondary | ICD-10-CM | POA: Diagnosis present

## 2014-12-23 DIAGNOSIS — E1065 Type 1 diabetes mellitus with hyperglycemia: Secondary | ICD-10-CM | POA: Diagnosis present

## 2014-12-23 DIAGNOSIS — Z794 Long term (current) use of insulin: Secondary | ICD-10-CM | POA: Diagnosis not present

## 2014-12-23 DIAGNOSIS — E871 Hypo-osmolality and hyponatremia: Secondary | ICD-10-CM | POA: Diagnosis present

## 2014-12-23 DIAGNOSIS — M109 Gout, unspecified: Secondary | ICD-10-CM | POA: Diagnosis present

## 2014-12-23 DIAGNOSIS — I952 Hypotension due to drugs: Secondary | ICD-10-CM | POA: Diagnosis present

## 2014-12-23 DIAGNOSIS — I2510 Atherosclerotic heart disease of native coronary artery without angina pectoris: Secondary | ICD-10-CM | POA: Diagnosis present

## 2014-12-23 DIAGNOSIS — Z955 Presence of coronary angioplasty implant and graft: Secondary | ICD-10-CM | POA: Diagnosis not present

## 2014-12-23 DIAGNOSIS — G4733 Obstructive sleep apnea (adult) (pediatric): Secondary | ICD-10-CM | POA: Diagnosis present

## 2014-12-23 DIAGNOSIS — E291 Testicular hypofunction: Secondary | ICD-10-CM | POA: Diagnosis present

## 2014-12-23 DIAGNOSIS — Z86010 Personal history of colonic polyps: Secondary | ICD-10-CM | POA: Diagnosis not present

## 2014-12-23 DIAGNOSIS — E10319 Type 1 diabetes mellitus with unspecified diabetic retinopathy without macular edema: Secondary | ICD-10-CM | POA: Diagnosis present

## 2014-12-23 DIAGNOSIS — Z7982 Long term (current) use of aspirin: Secondary | ICD-10-CM | POA: Diagnosis not present

## 2014-12-23 DIAGNOSIS — R531 Weakness: Secondary | ICD-10-CM | POA: Diagnosis present

## 2014-12-23 DIAGNOSIS — E785 Hyperlipidemia, unspecified: Secondary | ICD-10-CM | POA: Diagnosis present

## 2014-12-23 DIAGNOSIS — I129 Hypertensive chronic kidney disease with stage 1 through stage 4 chronic kidney disease, or unspecified chronic kidney disease: Secondary | ICD-10-CM | POA: Diagnosis present

## 2014-12-23 DIAGNOSIS — Z87891 Personal history of nicotine dependence: Secondary | ICD-10-CM | POA: Diagnosis not present

## 2014-12-23 DIAGNOSIS — N184 Chronic kidney disease, stage 4 (severe): Secondary | ICD-10-CM | POA: Diagnosis present

## 2014-12-23 LAB — BASIC METABOLIC PANEL
Anion gap: 12 (ref 5–15)
Anion gap: 10 (ref 5–15)
Anion gap: 10 (ref 5–15)
BUN: 85 mg/dL — ABNORMAL HIGH (ref 6–23)
BUN: 82 mg/dL — AB (ref 6–23)
BUN: 80 mg/dL — AB (ref 6–23)
CALCIUM: 9.3 mg/dL (ref 8.4–10.5)
CHLORIDE: 98 mmol/L (ref 96–112)
CO2: 22 mmol/L (ref 19–32)
CO2: 22 mmol/L (ref 19–32)
CO2: 23 mmol/L (ref 19–32)
CREATININE: 2.83 mg/dL — AB (ref 0.50–1.35)
CREATININE: 3.02 mg/dL — AB (ref 0.50–1.35)
Calcium: 9.5 mg/dL (ref 8.4–10.5)
Calcium: 9.2 mg/dL (ref 8.4–10.5)
Chloride: 98 mmol/L (ref 96–112)
Chloride: 99 mmol/L (ref 96–112)
Creatinine, Ser: 2.73 mg/dL — ABNORMAL HIGH (ref 0.50–1.35)
GFR calc Af Amer: 26 mL/min — ABNORMAL LOW (ref >90)
GFR calc Af Amer: 27 mL/min — ABNORMAL LOW (ref >90)
GFR calc non Af Amer: 20 mL/min — ABNORMAL LOW (ref >90)
GFR calc non Af Amer: 22 mL/min — ABNORMAL LOW (ref >90)
GFR, EST AFRICAN AMERICAN: 24 mL/min — AB (ref >90)
GFR, EST NON AFRICAN AMERICAN: 23 mL/min — AB (ref >90)
GLUCOSE: 172 mg/dL — AB (ref 70–99)
Glucose, Bld: 139 mg/dL — ABNORMAL HIGH (ref 70–99)
Glucose, Bld: 142 mg/dL — ABNORMAL HIGH (ref 70–99)
POTASSIUM: 4.7 mmol/L (ref 3.5–5.1)
Potassium: 4.8 mmol/L (ref 3.5–5.1)
Potassium: 4.9 mmol/L (ref 3.5–5.1)
Sodium: 133 mmol/L — ABNORMAL LOW (ref 135–145)
Sodium: 131 mmol/L — ABNORMAL LOW (ref 135–145)
Sodium: 130 mmol/L — ABNORMAL LOW (ref 135–145)

## 2014-12-23 LAB — CBC
HCT: 39.9 % (ref 39.0–52.0)
Hemoglobin: 13.6 g/dL (ref 13.0–17.0)
MCH: 30.8 pg (ref 26.0–34.0)
MCHC: 34.1 g/dL (ref 30.0–36.0)
MCV: 90.5 fL (ref 78.0–100.0)
PLATELETS: 139 10*3/uL — AB (ref 150–400)
RBC: 4.41 MIL/uL (ref 4.22–5.81)
RDW: 12.9 % (ref 11.5–15.5)
WBC: 7.1 10*3/uL (ref 4.0–10.5)

## 2014-12-23 LAB — GLUCOSE, CAPILLARY
GLUCOSE-CAPILLARY: 143 mg/dL — AB (ref 70–99)
Glucose-Capillary: 170 mg/dL — ABNORMAL HIGH (ref 70–99)
Glucose-Capillary: 166 mg/dL — ABNORMAL HIGH (ref 70–99)
Glucose-Capillary: 199 mg/dL — ABNORMAL HIGH (ref 70–99)

## 2014-12-23 MED ORDER — DOXAZOSIN MESYLATE 8 MG PO TABS
8.0000 mg | ORAL_TABLET | Freq: Every day | ORAL | Status: DC
  Filled 2014-12-23: qty 1

## 2014-12-23 MED ORDER — DOXAZOSIN MESYLATE 8 MG PO TABS
8.0000 mg | ORAL_TABLET | Freq: Every day | ORAL | Status: DC
  Administered 2014-12-23: 8 mg via ORAL
  Filled 2014-12-23 (×2): qty 1

## 2014-12-23 NOTE — Progress Notes (Signed)
[facility] PROGRESS NOTE  Bryan Haley JJO:[id number] DOB: 11-Jan-1951 DOA: 12/22/2014 PCP: [name], MD  Assessment/Plan: Acute on Chronic Renal Failure -The patient has history of chronic kidney disease. Etiology uncertain although on patient's EMR - Renal U/S reviewed and negative - Monitor S creatinine improving off antihypertensive medication. - Hold nephrotoxic agents: ARB, ACEI, indomethacin, Lasix  - Continue gentle fluid hydration  Hyponatremia - improved with gentle fluid hydration.  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 No acute issues reported overnight.  Objective: Filed Vitals:   12/23/14 1030  BP: 107/53  Pulse: 65  Temp:   Resp:     Intake/Output Summary (Last 24 hours) at 12/23/14 1412 Last data filed at 12/23/14 0900  Gross per 24 hour  Intake   2580 ml  Output      0 ml  Net   2580 ml   Filed Weights   12/22/14 1501 12/22/14 2000  Weight: 120.203 kg (265 lb) 122.426 kg (269 lb 14.4 oz)    Exam:   General:  Pt in nad, alert and awake  Cardiovascular: non cyanotic no edema  Respiratory: no increased wob, no wheezes  Abdomen: soft, obese, nd  Musculoskeletal: no cyanosis or clubbing   Data Reviewed: Basic Metabolic Panel:  Recent Labs Lab 12/22/14 1534 12/22/14 1823 12/23/14 0025 12/23/14 0545 12/23/14 1142  NA 126* 130* 133* 131* 130*  K 5.0 4.9 4.8 4.7 4.9   CL 92* 95* 98 99 98  CO2 21 22 23 22 22   GLUCOSE 170* 125* 139* 142* 172*  BUN 88* 85* 85* 80* 82*  CREATININE 3.13* 3.01* 3.02* 2.83* 2.73*  CALCIUM 9.6 9.7 9.5 9.3 9.2   Liver Function Tests: No results for input(s): AST, ALT, ALKPHOS, BILITOT, PROT, ALBUMIN in the last 168 hours. No results for input(s): LIPASE, AMYLASE in the last 168 hours. No results for input(s): AMMONIA in the last 168 hours. CBC:  Recent Labs Lab 12/22/14 1534 12/23/14 0545  WBC 7.9 7.1  NEUTROABS 5.6  --   HGB 13.7 13.6  HCT 39.8 39.9  MCV 89.4 90.5  PLT 144* 139*   Cardiac Enzymes: No results for input(s): CKTOTAL, CKMB, CKMBINDEX, TROPONINI in the last 168 hours. BNP (last 3 results) No results for input(s): PROBNP in the last 8760 hours. CBG:  Recent Labs Lab 12/22/14 1949 12/22/14 2107 12/23/14 0742 12/23/14 1138  GLUCAP 104* 176* 143* 170*    No results found for this or any previous visit (from the past 240 hour(s)).   Studies: US Renal  12/22/2014   CLINICAL DATA:  Renal insufficiency. Acute on chronic renal failure.  EXAM: RENAL/URINARY TRACT ULTRASOUND COMPLETE  COMPARISON:  02/28/2005.  FINDINGS: Right Kidney:  Length: 12.3 cm. Echogenicity within normal limits. No mass or hydronephrosis visualized.  Left Kidney:  Length: 12.3 cm. Echogenicity within normal limits. No mass or hydronephrosis visualized.  Bladder:  Appears normal for degree of bladder distention.  IMPRESSION: Normal renal ultrasound.  No hydronephrosis or calculi.  Electronically Signed   By: Dereck Ligas M.D.   On: 12/22/2014 21:09    Scheduled Meds: . allopurinol  300 mg Oral Daily  . aspirin  81 mg Oral Daily  . carvedilol  3.125 mg Oral Daily  . clopidogrel  75 mg Oral Q breakfast  . doxazosin  8 mg Oral QHS  . heparin  5,000 Units Subcutaneous 3 times per day  . insulin aspart  0-15 Units Subcutaneous TID WC  . isosorbide mononitrate  30 mg Oral Daily  . simvastatin  40 mg Oral QHS   Continuous  Infusions: . 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 If 7PM-7AM, please contact night-coverage at [email or web address], password [facility] 12/23/2014, 2:12 PM  LOS: 1 day

## 2014-12-23 NOTE — Progress Notes (Signed)
UR completed 

## 2014-12-23 NOTE — Care Management Note (Signed)
    Page 1 of 1   12/23/2014     2:49:30 PM CARE MANAGEMENT NOTE 12/23/2014  Patient:  TARAY, NORMOYLE   Account Number:  [id number]  Date Initiated:  12/23/2014  Documentation initiated by:  [facility]  Subjective/Objective Assessment:   Chief Complaint: weakness     Action/Plan:   discharge planing   Anticipated DC Date:  12/24/2014   Anticipated DC Plan:  [place]  CM consult      Choice offered to / List presented to:             Status of service:   Medicare Important Message given?   (If response is "NO", the following Medicare IM given date fields will be blank) Date Medicare IM given:   Medicare IM given by:   Date Additional Medicare IM given:   Additional Medicare IM given by:    Discharge Disposition:    Per UR Regulation:    If discussed at Long Length of Stay Meetings, dates discussed:    Comments:  12/23/14 14:25 CM notes pt from home with wife: Cleo, Villamizar, [phone].  Reviewed and no needs at this time.  Mariane Masters, BSN, CM [phone].

## 2014-12-24 DIAGNOSIS — E785 Hyperlipidemia, unspecified: Secondary | ICD-10-CM

## 2014-12-24 DIAGNOSIS — E1029 Type 1 diabetes mellitus with other diabetic kidney complication: Secondary | ICD-10-CM

## 2014-12-24 LAB — GLUCOSE, CAPILLARY: GLUCOSE-CAPILLARY: 129 mg/dL — AB (ref 70–99)

## 2014-12-24 MED ORDER — AMLODIPINE BESYLATE 5 MG PO TABS: 5.0000 mg | ORAL_TABLET | Freq: Every day | ORAL | Status: DC

## 2014-12-24 NOTE — Progress Notes (Signed)
Discharge summary sent to payer through [facility]  

## 2014-12-24 NOTE — Progress Notes (Signed)
Discharge instructions given to pt, verbalized understanding. Left the unit in stable condition. 

## 2014-12-24 NOTE — Discharge Summary (Signed)
Physician Discharge Summary  Bryan Haley:[id number] DOB: 1951/03/24 DOA: 12/22/2014  PCP: [name], MD  Admit date: 12/22/2014 Discharge date: 12/24/2014  Recommendations for Outpatient Follow-up:  1.  Please continue to hold Lasix and lisinopril because of  Increased creatinine. Her creatinine has improved since the admission but we held those medications.  Follow-up with primary care physician in about one week and they will recheck your kidney function and if it's getting better then you may restart lisinopril and Lasix. 2.  I have informed the patient's primary care physician of the changes above. The have scheduled an appointment for the patient on 12/30/2014 at 10 AM and they will recheck kidney function and perhaps even have patient follow-up with nephrology which apparently was the case before for chronic kidney disease.  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 male  with past medical history of coronary artery disease, chronic kidney disease, diabetes type 1 on insulin who presented to [facility] ED  With complaints of worsening weakness and fatigue for past couple days prior to this admission. Patient did not have any other complaints on the admission. He was found to have acute elevation in creatinine about his baseline. His creatinine was about 3 at the time of the admission. Throughout the hospital stay Lasix and lisinopril as well as losartan and indomethacin were put on hold. Kidney function has improved since the admission and it is 2.7 prior to discharge.  Hospital Course:   Acute on Chronic Renal Failure /  Chronic kidney disease stage IV - Creatinine  about 3 years ago 1.5.  - Creatinine on this admission was 3.  Patient is on few medications that could potentially exacerbate kidney failure, lisinopril, Lasix, indomethacin, losartan. -  All these medications were held but because patient does have hypertension we will restart losartan continue to hold lisinopril and Lasix. Primary care physician is aware of this change. -  Renal ultrasound on this admission is unremarkable.  Hyponatremia -  Perhaps because of Lasix or acute on chronic renal failure -  Sodium level is 130 prior to discharge  Hyperlipidemia -  Continue statin therapy  History of gout -  With complaints of pain, we will restart allopurinol and indomethacin. Primary care physician is aware that both medications are resumed and if kidney function is worse they will stop indomethacin.  Essential hypertension - Continue Imdur, carvedilol,   Type 1 diabetes mellitus, uncontrolled with renal manifestations - Last A1c in December 2015 was 8.3 indicating poor glycemic control -  Patient can continue his regular insulin regimen at home as per prior to this admission  CAD (coronary artery disease) - Plavix and aspirin  Code Status: full Family Communication: discussed directly with patient    Signed:  Leisa Lenz, MD  [facility] 12/24/2014, 10:08 AM  Pager #: [phone]  Procedures:  None   Consultations:  None   Discharge Exam: Filed Vitals:   12/24/14 0958  BP: 126/61  Pulse: 64  Temp:   Resp: 18   Filed Vitals:   12/23/14 1540 12/23/14 2148 12/24/14 0632 12/24/14 0958  BP: 114/50 132/57 99/54 126/61  Pulse: 62 63 75 64  Temp:  97.8 F (36.6 C) 97.9 F (36.6 C) 98.2 F (36.8 C)   TempSrc: Oral Oral Oral   Resp: 20 18 18 18   Height:      Weight:      SpO2: 98% 98% 95%     General: Pt is alert, follows commands appropriately, not in acute distress Cardiovascular: Regular rate and rhythm, S1/S2 +, no murmurs Respiratory: Clear to  auscultation bilaterally, no wheezing, no crackles, no rhonchi Abdominal: Soft, non tender, non distended, bowel sounds +, no guarding Extremities: no edema, no cyanosis, pulses palpable bilaterally DP and PT Neuro: Grossly nonfocal  Discharge Instructions  Discharge Instructions    Call MD for:  difficulty breathing, headache or visual disturbances    Complete by:  As directed      Call MD for:  persistant nausea and vomiting    Complete by:  As directed      Call MD for:  severe uncontrolled pain    Complete by:  As directed      Diet - low sodium heart healthy    Complete by:  As directed      Discharge instructions    Complete by:  As directed   1.  Please continue to hold Lasix and lisinopril because of  Increased creatinine. Her creatinine has improved since the admission but we held those medications.  Follow-up with primary care physician in about one week and they will recheck your kidney function and if it's getting better then you may restart lisinopril and Lasix.     Increase activity slowly    Complete by:  As directed             Medication List    STOP taking these medications        fluocinonide-emollient 0.05 % cream  Commonly known as:  LIDEX-E     furosemide 80 MG tablet  Commonly known as:  LASIX     lisinopril 40 MG tablet  Commonly known as:  PRINIVIL,ZESTRIL      TAKE these medications        allopurinol 300 MG tablet  Commonly known as:  ZYLOPRIM  Take 1 tablet (300 mg total) by mouth daily.     amLODipine 5 MG tablet  Commonly known as:  NORVASC  Take 1 tablet (5 mg total) by mouth daily.     aspirin 81 MG chewable tablet  Chew 1 tablet (81 mg total) by mouth daily.     B-D INSULIN SYRINGE 29G X 1/2" 1 ML Misc  Generic drug:  INS SYRINGE/NEEDLE 1CC/29G  Use as directed     B-D UF III MINI PEN NEEDLES 31G X 5 MM Misc  Generic drug:  Insulin Pen Needle  INJECT 1 PEN AS DIRECTED 3 (THREE) TIMES DAILY. USE AS DIRECTED 3X DAILY      carvedilol 3.125 MG tablet  Commonly known as:  COREG  Take 1 tablet (3.125 mg total) by mouth daily.     clopidogrel 75 MG tablet  Commonly known as:  PLAVIX  Take 1 tablet (75 mg total) by mouth daily with breakfast.     doxazosin 8 MG tablet  Commonly known as:  CARDURA  Take 1 tablet (8 mg total) by mouth at bedtime.     fluticasone 50 MCG/ACT nasal spray  Commonly known as:  FLONASE  Place 1 spray into both nostrils daily.     indomethacin 75 MG CR capsule  Commonly known as:  INDOCIN SR  TAKE 1  CAPSULE (75 MG TOTAL) BY MOUTH 2 (TWO) TIMES DAILY.     insulin aspart 100 UNIT/ML FlexPen  Commonly known as:  NOVOLOG FLEXPEN  Inject 60 units at breakfast, 30 units at lunch, and 50 units at supper     insulin NPH Human 100 UNIT/ML injection  Commonly known as:  NOVOLIN N  Inject 60 units daily     isosorbide mononitrate 30 MG 24 hr tablet  Commonly known as:  IMDUR  TAKE 1 TABLET BY MOUTH EVERY DAY     losartan 100 MG tablet  Commonly known as:  COZAAR  TAKE 1 TABLET TWICE A DAY     nitroGLYCERIN 0.4 MG SL tablet  Commonly known as:  NITROSTAT  Place 1 tablet (0.4 mg total) under the tongue every 5 (five) minutes as needed for chest pain.     ONE TOUCH ULTRA TEST test strip  Generic drug:  glucose blood  USE TO TEST TWICE A DAY     simvastatin 40 MG tablet  Commonly known as:  ZOCOR  Take 1 tablet (40 mg total) by mouth at bedtime.     Testosterone 10 MG/ACT (2%) Gel  APPLY 5 SQUIRTS ONTO THE SKIN DAILY     varenicline 1 MG tablet  Commonly known as:  CHANTIX  One half tab every morning           Follow-up Information    Follow up with TODD,JEFFREY Zenia Resides, MD.   Specialty:  Family Medicine   Why:  follow up with your doctor to discuss your blood pressure, decrease your amlodipine to 5mg , monitor your blood pressure   Contact information:   [address] [place] [address] [phone]        The results of significant diagnostics from  this hospitalization (including imaging, microbiology, ancillary and laboratory) are listed below for reference.    Significant Diagnostic Studies: US Renal  12/22/2014   CLINICAL DATA:  Renal insufficiency. Acute on chronic renal failure.  EXAM: RENAL/URINARY TRACT ULTRASOUND COMPLETE  COMPARISON:  02/28/2005.  FINDINGS: Right Kidney:  Length: 12.3 cm. Echogenicity within normal limits. No mass or hydronephrosis visualized.  Left Kidney:  Length: 12.3 cm. Echogenicity within normal limits. No mass or hydronephrosis visualized.  Bladder:  Appears normal for degree of bladder distention.  IMPRESSION: Normal renal ultrasound.  No hydronephrosis or calculi.   Electronically Signed   By: Dereck Ligas M.D.   On: 12/22/2014 21:09    Microbiology: No results found for this or any previous visit (from the past 240 hour(s)).   Labs: Basic Metabolic Panel:  Recent Labs Lab 12/22/14 1534 12/22/14 1823 12/23/14 0025 12/23/14 0545 12/23/14 1142  NA 126* 130* 133* 131* 130*  K 5.0 4.9 4.8 4.7 4.9  CL 92* 95* 98 99 98  CO2 21 22 23 22 22   GLUCOSE 170* 125* 139* 142* 172*  BUN 88* 85* 85* 80* 82*  CREATININE 3.13* 3.01* 3.02* 2.83* 2.73*  CALCIUM 9.6 9.7 9.5 9.3 9.2   Liver Function Tests: No results for input(s): AST, ALT, ALKPHOS, BILITOT, PROT, ALBUMIN in the last 168 hours. No results for input(s): LIPASE, AMYLASE in the last 168 hours. No results for input(s): AMMONIA in the last 168 hours. CBC:  Recent Labs Lab 12/22/14 1534 12/23/14 0545  WBC 7.9 7.1  NEUTROABS 5.6  --   HGB 13.7 13.6  HCT 39.8 39.9  MCV 89.4 90.5  PLT 144* 139*   Cardiac Enzymes: No results for input(s): CKTOTAL, CKMB,  CKMBINDEX, TROPONINI in the last 168 hours. BNP: BNP (last 3 results) No results for input(s): PROBNP in the last 8760 hours. CBG:  Recent Labs Lab 12/23/14 0742 12/23/14 1138 12/23/14 1630 12/23/14 2105 12/24/14 0732  GLUCAP 143* 170* 166* 199* 129*    Time coordinating  discharge: Over 30 minutes

## 2014-12-24 NOTE — Discharge Instructions (Signed)
Hypotension As your heart beats, it forces blood through your body. This force is called blood pressure. If you have hypotension, you have low blood pressure. When your blood pressure is too low, you may not get enough blood to your brain. You may feel weak, feel lightheaded, have a fast heartbeat, or even pass out (faint). HOME CARE  Drink enough fluids to keep your pee (urine) clear or pale yellow.  Take all medicines as told by your doctor.  Get up slowly after sitting or lying down.  Wear support stockings as told by your doctor.  Maintain a healthy diet by including foods such as fruits, vegetables, nuts, whole grains, and lean meats. GET HELP IF:  You are throwing up (vomiting) or have watery poop (diarrhea).  You have a fever for more than 2-3 days.  You feel more thirsty than usual.  You feel weak and tired. GET HELP RIGHT AWAY IF:   You pass out (faint).  You have chest pain or a fast or irregular heartbeat.  You lose feeling in part of your body.  You cannot move your arms or legs.  You have trouble speaking.  You get sweaty or feel lightheaded. MAKE SURE YOU:   Understand these instructions.  Will watch your condition.  Will get help right away if you are not doing well or get worse. Document Released: 02/08/2010 Document Revised: 07/17/2013 Document Reviewed: 05/17/2013 Exit[employer] Patient Information 2015 [employer]. This information is not intended to replace advice given to you by your health care provider. Make sure you discuss any questions you have with your health care provider.  

## 2014-12-29 ENCOUNTER — Other Ambulatory Visit: Payer: Self-pay | Admitting: Family Medicine

## 2014-12-30 ENCOUNTER — Encounter: Payer: Self-pay | Admitting: Family Medicine

## 2014-12-30 ENCOUNTER — Ambulatory Visit (INDEPENDENT_AMBULATORY_CARE_PROVIDER_SITE_OTHER): Payer: Managed Care, Other (non HMO) | Admitting: Family Medicine

## 2014-12-30 VITALS — BP 120/78 | HR 64 | Temp 98.3°F | Wt 264.0 lb

## 2014-12-30 DIAGNOSIS — N259 Disorder resulting from impaired renal tubular function, unspecified: Secondary | ICD-10-CM

## 2014-12-30 DIAGNOSIS — N189 Chronic kidney disease, unspecified: Secondary | ICD-10-CM

## 2014-12-30 DIAGNOSIS — E1065 Type 1 diabetes mellitus with hyperglycemia: Secondary | ICD-10-CM

## 2014-12-30 DIAGNOSIS — E1022 Type 1 diabetes mellitus with diabetic chronic kidney disease: Secondary | ICD-10-CM

## 2014-12-30 DIAGNOSIS — IMO0002 Reserved for concepts with insufficient information to code with codable children: Secondary | ICD-10-CM

## 2014-12-30 DIAGNOSIS — I10 Essential (primary) hypertension: Secondary | ICD-10-CM

## 2014-12-30 LAB — BASIC METABOLIC PANEL
BUN: 31 mg/dL — AB (ref 6–23)
CHLORIDE: 104 meq/L (ref 96–112)
CO2: 27 mEq/L (ref 19–32)
CREATININE: 1.79 mg/dL — AB (ref 0.40–1.50)
Calcium: 9.7 mg/dL (ref 8.4–10.5)
GFR: 40.72 mL/min — ABNORMAL LOW (ref >60.00)
Glucose, Bld: 108 mg/dL — ABNORMAL HIGH (ref 70–99)
Potassium: 5.1 mEq/L (ref 3.5–5.1)
SODIUM: 137 meq/L (ref 135–145)

## 2014-12-30 NOTE — Progress Notes (Signed)
Pre visit review using our clinic review tool, if applicable. No additional management support is needed unless otherwise documented below in the visit note. 

## 2014-12-30 NOTE — Patient Instructions (Signed)
Decrease your evening long-acting insulin from 40 units to 35  Continue diet  Like to see that morning sugar around 120. If after 2 weeks your blood sugars below 120 then decrease her long-acting insulin to 30 units  Continue small amounts of of insulin......Marland Kitchen 10 units before each meal  Continue your exercise and diet  Stop the indomethacin  Follow-up in one month sooner if any problems

## 2014-12-30 NOTE — Progress Notes (Signed)
   Subjective:    Patient ID: Bryan Haley, male    DOB: 1951/03/12, 64 y.o.   MRN: [id number]  HPI Bryan Haley is a 64 year old male who comes in today for evaluation having been hospitalized for hypotension and acute renal failure  He is a type I diabetic who has underlying hypertension hyperlipidemia coronary artery disease and was a smoker. He tells me in November he couldn't keep up with his grandchildren so he decided to change his lifestyle and started on a diet and exercise program. He's lost 29 pounds. In the meantime he's continued to take his medication. On January 25 he called the office because he hadn't felt well for a week fatigue and his blood pressure was 90 systolic. We send him to the hospital. He had a 48 hour stay at which time they held his Lasix and lisinopril rehydrated him. Creatinine and risen to 3. Renal ultrasound shows no obstruction no tumors basically normal anatomy creatinine dropped to 2.73 on discharge B1 was 82 on discharge  He has cut his insulin back. He was on 60 units before breakfast 30 units before lunch and 50 units before supper and 60 units of long-acting at bedtime. Now he is on 10 units before each meal and is cut down his evening dose from 60-40. Blood sugar this morning 98  Blood pressure today 120/78 weight down to 264 pounds pulse 64 and regular  November 1 he also quit smoking   Review of Systems    review of systems otherwise negative Objective:   Physical Exam  Well-developed well-nourished male no acute distress vital signs stable he's afebrile      Assessment & Plan:  Diabetes type 1........Marland Kitchen much improved control since he started diet exercise program........ decrease his long-acting insulin from 40-35 units because morning blood sugars are in the 90s  Hypertension ago continue current therapy  Renal insufficiency chronic......... continue to hold the lisinopril Lasix and stop the indomethacin  History of gout continue  allopurinol  X smoker since November 1  Outlined a treatment program follow-up in one month

## 2014-12-31 ENCOUNTER — Telehealth: Payer: Self-pay | Admitting: Family Medicine

## 2014-12-31 NOTE — Telephone Encounter (Signed)
emmi emailed °

## 2015-01-18 ENCOUNTER — Other Ambulatory Visit: Payer: Self-pay | Admitting: Endocrinology

## 2015-01-27 ENCOUNTER — Encounter: Payer: Self-pay | Admitting: Family Medicine

## 2015-01-27 ENCOUNTER — Ambulatory Visit (INDEPENDENT_AMBULATORY_CARE_PROVIDER_SITE_OTHER): Payer: Managed Care, Other (non HMO) | Admitting: Family Medicine

## 2015-01-27 VITALS — BP 120/80 | Temp 98.1°F | Wt 251.0 lb

## 2015-01-27 DIAGNOSIS — IMO0002 Reserved for concepts with insufficient information to code with codable children: Secondary | ICD-10-CM

## 2015-01-27 DIAGNOSIS — E1065 Type 1 diabetes mellitus with hyperglycemia: Secondary | ICD-10-CM

## 2015-01-27 DIAGNOSIS — I10 Essential (primary) hypertension: Secondary | ICD-10-CM

## 2015-01-27 LAB — BASIC METABOLIC PANEL
BUN: 19 mg/dL (ref 6–23)
CO2: 31 meq/L (ref 19–32)
Calcium: 9.1 mg/dL (ref 8.4–10.5)
Chloride: 105 mEq/L (ref 96–112)
Creatinine, Ser: 1.57 mg/dL — ABNORMAL HIGH (ref 0.40–1.50)
GFR: 47.37 mL/min — AB (ref >60.00)
Glucose, Bld: 131 mg/dL — ABNORMAL HIGH (ref 70–99)
POTASSIUM: 4.2 meq/L (ref 3.5–5.1)
SODIUM: 140 meq/L (ref 135–145)

## 2015-01-27 LAB — MICROALBUMIN / CREATININE URINE RATIO
Creatinine,U: 141.7 mg/dL
MICROALB UR: 100.4 mg/dL — AB (ref 0.0–1.9)
Microalb Creat Ratio: 70.8 mg/g — ABNORMAL HIGH (ref 0.0–30.0)

## 2015-01-27 LAB — HEMOGLOBIN A1C: HEMOGLOBIN A1C: 6.6 % — AB (ref 4.6–6.5)

## 2015-01-27 NOTE — Progress Notes (Signed)
Pre visit review using our clinic review tool, if applicable. No additional management support is needed unless otherwise documented below in the visit note. 

## 2015-01-27 NOTE — Progress Notes (Signed)
   Subjective:    Patient ID: Bryan Haley, male    DOB: [birth year], 64 y.o.   MRN: [id number]  HPI Bryan Haley is a 64 year old married male nonsmoker,,,,,,,,, since last fall,,,,, who comes in today for follow-up of multiple issues  He has the classic metabolic syndrome of obesity diabetes hypertension hyperlipidemia coronary artery disease and renal insufficiency. In January had an admission with acute renal failure. Since that time he's changed his complete diet. Overall he's lost 43 pounds. We been able to stop his insulin before each meal and decrease his long-acting insulin from 60 units a day down to 30. He says his sugars in the morning are ranging from 8200.  BP 120/80 on Norvasc 5 mg, Coreg 3.125 mg, Cardura 8 mg, and Cozaar 100 mg. We'll begin to taper his medication. Also we stopped his lisinopril Indocin and Lasix because of the renal insufficiency.  We also removed a skin cancer from his back and he see her to follow-up to be sure there is no recurrence.  He's to be congratulated he may remarkable changes in lifestyle. 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 We have stopped that because of the renal insufficiency. Will see what his labs look like and then make a decision on that.   Review of Systems    review of systems otherwise negative Objective:   Physical Exam Well-developed well-nourished male no acute distress vital signs stable weight down to 251 BP 120/80. Skin exam shows a well-healed lesion right subscapular area. No evidence of recurrence       Assessment & Plan:  Skin cancer....... no evidence of recurrence  Hypertension at goal.........Marland Kitchen with improvement in diet exercise weight loss and no smoking begin to taper medication  Diabetes type 1 much improved control........  decrease long-acting insulin  Renal insufficiency......Marland Kitchen recheck labs

## 2015-01-27 NOTE — Patient Instructions (Addendum)
Cozaar 100 mg......Marland Kitchen 1 daily in the morning  BP check every morning..... If in one month your blood pressure still 135/85 or less.......... cut the Cozaar in half  Labs today  I will call you the report  You're to be congratulated........... you've made tremendous progress,,,,,,,, keep up the good work,,,,,,, follow-up mid-April  Decrease the long-acting insulin to 25 units daily

## 2015-02-02 ENCOUNTER — Encounter: Payer: Self-pay | Admitting: Endocrinology

## 2015-02-02 ENCOUNTER — Ambulatory Visit (INDEPENDENT_AMBULATORY_CARE_PROVIDER_SITE_OTHER): Payer: Managed Care, Other (non HMO) | Admitting: Endocrinology

## 2015-02-02 VITALS — BP 124/70 | HR 57 | Temp 98.1°F | Wt 252.4 lb

## 2015-02-02 DIAGNOSIS — N189 Chronic kidney disease, unspecified: Secondary | ICD-10-CM

## 2015-02-02 DIAGNOSIS — E1022 Type 1 diabetes mellitus with diabetic chronic kidney disease: Secondary | ICD-10-CM

## 2015-02-02 MED ORDER — INSULIN LISPRO 100 UNIT/ML (KWIKPEN): 10.0000 [IU] | PEN_INJECTOR | Freq: Three times a day (TID) | SUBCUTANEOUS | Status: DC

## 2015-02-02 NOTE — Patient Instructions (Addendum)
Please come back for a follow-up appointment in 3 months.   check your blood sugar twice a day.  vary the time of day when you check, between before the 3 meals, and at bedtime.  also check if you have symptoms of your blood sugar being too high or too low.  please keep a record of the readings and bring it to your next appointment here.  You can write it on any piece of paper.  please call us sooner if your blood sugar goes below 70, or if you have a lot of readings over 200.  PIease decrease the novolog to 10 units 3 times a day (just before each meal), and: If your blood sugar is low on this, please call so we can change to a pill called "repaglinide."  Stay-off the bedtime insulin.

## 2015-02-02 NOTE — Progress Notes (Signed)
Pre visit review using our clinic review tool, if applicable. No additional management support is needed unless otherwise documented below in the visit note. 

## 2015-02-02 NOTE — Progress Notes (Signed)
Subjective:    Patient ID: Bryan Haley, male    DOB: 22-Jul-1951, 64 y.o.   MRN: [id number]  HPI Pt returns for f/u of diabetes mellitus: DM type: Insulin-requiring type 2 Dx'ed: 5573 Complications: renal insufficiency and CAD Therapy: insulin since 2009 DKA: never Severe hypoglycemia: never Pancreatitis: never Other: insurance declined weight-loss surgery; he takes multiple daily injections Interval history: He has mild hypoglycemia almost daily, so he has reduced the insulin to a total of 25 units per day.  no cbg record, but states cbg's are in the low-100's.   Past Medical History  Diagnosis Date  . HYPOGONADISM 06/11/2010  . GOUT 07/11/2007  . HYPERTENSION 07/06/2007  . COLONIC POLYPS, HX OF 10/02/2008  . RENAL INSUFFICIENCY 12/11/2008  . Hyperlipidemia   . Other and unspecified angina pectoris   . Coronary artery disease   . Exertional shortness of breath     "last 2-3 yrs" (12/04/2013)  . OSA (obstructive sleep apnea) 10/08/2009    wears CPAP "more than 1/2 the time"  . Diabetic retinopathy of both eyes     Laser surgery, Dr. Zadie Rhine  . DM W/COMPLICATION NOS, TYPE I 08/09/2007    "dx'd ~ 1994; I say it's type II" (12/04/2013)  . Coronary atherosclerosis of native coronary artery 12/05/2013    Prox RCA, and PLA DES 12/04/13.     Past Surgical History  Procedure Laterality Date  . Colonoscopy    . Carpal tunnel release Left ~ 2004  . Coronary angioplasty with stent placement  12/04/2013    "2 stents"  . Eye examination under anesthesia w/ retinal cryotherapy and retinal laser Bilateral 2005-2010    "for my diabetes" (12/04/2013)  . Left heart catheterization with coronary angiogram Bilateral 12/04/2013    Procedure: LEFT HEART CATHETERIZATION WITH CORONARY ANGIOGRAM;  Surgeon: Candee Furbish, MD;  Location: [facility] CATH LAB;  Service: Cardiovascular;  Laterality: Bilateral;    History   Social History  . Marital Status: Married    Spouse Name: N/A  . Number of Children: N/A  .  Years of Education: N/A   Occupational History  . sales    Social History Main Topics  . Smoking status: Former Smoker -- 1.50 packs/day for 45 years    Types: Cigarettes    Quit date: 10/02/2010  . Smokeless tobacco: Never Used  . Alcohol Use: 0.6 oz/week    1 Shots of liquor per week  . Drug Use: No  . Sexual Activity: Yes   Other Topics Concern  . Not on file   Social History Narrative   Regular exercise-no   Pt went to seminar to quit smoking    Current Outpatient Prescriptions on File Prior to Visit  Medication Sig Dispense Refill  . allopurinol (ZYLOPRIM) 300 MG tablet Take 1 tablet (300 mg total) by mouth daily. 100 tablet 3  . amLODipine (NORVASC) 5 MG tablet Take 1 tablet (5 mg total) by mouth daily. 30 tablet 0  . aspirin 81 MG chewable tablet Chew 1 tablet (81 mg total) by mouth daily.    . B-D UF III MINI PEN NEEDLES 31G X 5 MM MISC INJECT 1 PEN AS DIRECTED 3 (THREE) TIMES DAILY. USE AS DIRECTED 3X DAILY 100 each 2  . carvedilol (COREG) 3.125 MG tablet Take 1 tablet (3.125 mg total) by mouth daily. 100 tablet 3  . clopidogrel (PLAVIX) 75 MG tablet Take 1 tablet (75 mg total) by mouth daily with breakfast. 100 tablet 3  . doxazosin (CARDURA)  8 MG tablet Take 1 tablet (8 mg total) by mouth at bedtime. 100 tablet 3  . fluticasone (FLONASE) 50 MCG/ACT nasal spray Place 1 spray into both nostrils daily. 16 g 11  . indomethacin (INDOCIN SR) 75 MG CR capsule TAKE 1 CAPSULE (75 MG TOTAL) BY MOUTH 2 (TWO) TIMES DAILY. 200 capsule 3  . INS SYRINGE/NEEDLE 1CC/29G (B-D INSULIN SYRINGE) 29G X 1/2" 1 ML MISC Use as directed     . isosorbide mononitrate (IMDUR) 30 MG 24 hr tablet TAKE 1 TABLET BY MOUTH EVERY DAY 90 tablet 1  . losartan (COZAAR) 100 MG tablet TAKE 1 TABLET TWICE A DAY 200 tablet 2  . nitroGLYCERIN (NITROSTAT) 0.4 MG SL tablet Place 1 tablet (0.4 mg total) under the tongue every 5 (five) minutes as needed for chest pain. 30 tablet 3  . ONE TOUCH ULTRA TEST test strip  USE TO TEST TWICE A DAY 100 each 1  . simvastatin (ZOCOR) 40 MG tablet Take 1 tablet (40 mg total) by mouth at bedtime. 100 tablet 2  . Testosterone 10 MG/ACT (2%) GEL APPLY 5 SQUIRTS ONTO THE SKIN DAILY 60 g 5   No current facility-administered medications on file prior to visit.    No Known Allergies  Family History  Problem Relation Age of Onset  . Cancer Neg Hx     No family history of colon cancer  . Colon cancer Neg Hx   . Esophageal cancer Neg Hx   . Stomach cancer Neg Hx   . Rectal cancer Neg Hx   . Diabetes Other   . Hyperlipidemia Other   . Hypertension Other   . Heart disease Father     BP 124/70 mmHg  Pulse 57  Temp(Src) 98.1 F (36.7 C) (Oral)  Wt 252 lb 6.4 oz (114.488 kg)  SpO2 97%    Review of Systems He has lost weight.  Denies LOC     Objective:   Physical Exam VITAL SIGNS:  See vs page.  GENERAL: no distress. Pulses: dorsalis pedis intact bilat.   MSK: no deformity of the feet. CV: trace bilat leg edema.   Skin:  no ulcer on the feet.  normal color and temp on the feet. Neuro: sensation is intact to touch on the feet.     Lab Results  Component Value Date   HGBA1C 6.6* 01/27/2015   Lab Results  Component Value Date   CREATININE 1.57* 01/27/2015   BUN 19 01/27/2015   NA 140 01/27/2015   K 4.2 01/27/2015   CL 105 01/27/2015   CO2 31 01/27/2015       Assessment & Plan:  DM: overcontrolled. Weight loss: he is advised to continue Renal failure: improved.  Due to the improvement, his insulin requirement may re-increase.    Patient is advised the following: Patient Instructions  Please come back for a follow-up appointment in 3 months.   check your blood sugar twice a day.  vary the time of day when you check, between before the 3 meals, and at bedtime.  also check if you have symptoms of your blood sugar being too high or too low.  please keep a record of the readings and bring it to your next appointment here.  You can write it on any  piece of paper.  please call us sooner if your blood sugar goes below 70, or if you have a lot of readings over 200.  PIease decrease the novolog to 10 units 3 times a  day (just before each meal), and: If your blood sugar is low on this, please call so we can change to a pill called "repaglinide."  Stay-off the bedtime insulin.

## 2015-02-09 ENCOUNTER — Ambulatory Visit: Payer: Managed Care, Other (non HMO) | Admitting: Family Medicine

## 2015-02-09 ENCOUNTER — Ambulatory Visit: Payer: Managed Care, Other (non HMO) | Admitting: Endocrinology

## 2015-02-16 ENCOUNTER — Ambulatory Visit: Payer: Managed Care, Other (non HMO) | Admitting: Family Medicine

## 2015-03-09 ENCOUNTER — Ambulatory Visit (INDEPENDENT_AMBULATORY_CARE_PROVIDER_SITE_OTHER): Payer: Managed Care, Other (non HMO) | Admitting: Family Medicine

## 2015-03-09 ENCOUNTER — Encounter: Payer: Self-pay | Admitting: Family Medicine

## 2015-03-09 VITALS — BP 140/70 | Temp 98.4°F | Wt 253.0 lb

## 2015-03-09 DIAGNOSIS — IMO0002 Reserved for concepts with insufficient information to code with codable children: Secondary | ICD-10-CM

## 2015-03-09 DIAGNOSIS — I10 Essential (primary) hypertension: Secondary | ICD-10-CM

## 2015-03-09 DIAGNOSIS — E1022 Type 1 diabetes mellitus with diabetic chronic kidney disease: Secondary | ICD-10-CM | POA: Diagnosis not present

## 2015-03-09 DIAGNOSIS — E1065 Type 1 diabetes mellitus with hyperglycemia: Secondary | ICD-10-CM

## 2015-03-09 DIAGNOSIS — N189 Chronic kidney disease, unspecified: Secondary | ICD-10-CM

## 2015-03-09 MED ORDER — AMLODIPINE BESYLATE 10 MG PO TABS: 10.0000 mg | ORAL_TABLET | Freq: Every day | ORAL | Status: DC

## 2015-03-09 NOTE — Patient Instructions (Signed)
Norvasc 10 mg.........Marland Kitchen 1 daily in the morning  BP check every morning  Follow-up in 5 weeks,,,,,, when you return bring a record of all your blood pressure readings and the device

## 2015-03-09 NOTE — Progress Notes (Signed)
Pre visit review using our clinic review tool, if applicable. No additional management support is needed unless otherwise documented below in the visit note. Lab Results  Component Value Date   HGBA1C 6.6* 01/27/2015   HGBA1C 8.7* 10/28/2014   HGBA1C 8.0* 08/11/2014   Lab Results  Component Value Date   MICROALBUR 100.4* 01/27/2015   LDLCALC 34 09/10/2010   CREATININE 1.57* 01/27/2015

## 2015-03-09 NOTE — Progress Notes (Signed)
   Subjective:    Patient ID: Bryan Haley, male    DOB: Jan 04, 1951, 64 y.o.   MRN: [id number]  HPI Bryan Haley is a 64 year old male who comes in today for follow-up of hypertension diabetes  Dr. Earl Gala has increased his insulin and his blood sugars now normal. 131 with an A1c of 6.6%  His blood pressure today is 160/70 right arm sitting position. His digital cuffs reading about 14 points to [place] systolic. Diastolic is normal. He was on 10 mg of Norvasc daily. At that time BP was normal. When he was in the hospital they decrease the dose to 5 mg.   Review of Systems    review of systems otherwise negative Objective:   Physical Exam  Well-developed well-nourished male no acute distress vital signs stable he is afebrile BP right arm sitting position 160/70 pulse 70 and regular      Assessment & Plan:  Hypertension not at goal............ increase Norvasc to 10 mg daily  Diabetes type 1 at goal.....Marland Kitchen continue follow-up by Dr. Loanne Drilling.

## 2015-03-11 ENCOUNTER — Other Ambulatory Visit: Payer: Self-pay | Admitting: Endocrinology

## 2015-03-11 ENCOUNTER — Encounter: Payer: Self-pay | Admitting: Endocrinology

## 2015-03-11 MED ORDER — INSULIN NPH (HUMAN) (ISOPHANE) 100 UNIT/ML ~~LOC~~ SUSP: 10.0000 [IU] | Freq: Every day | SUBCUTANEOUS | Status: DC

## 2015-03-13 ENCOUNTER — Other Ambulatory Visit: Payer: Self-pay | Admitting: Family Medicine

## 2015-03-13 ENCOUNTER — Encounter: Payer: Self-pay | Admitting: Endocrinology

## 2015-03-16 ENCOUNTER — Telehealth: Payer: Self-pay

## 2015-03-16 NOTE — Telephone Encounter (Signed)
Received notice from pt's pharmacy stating the Tesosterone gel is not approved. Pt's insurance company prefers adrogel. Please advise if ok to change during Dr. Cordelia Pen absence. Thanks!

## 2015-03-16 NOTE — Telephone Encounter (Signed)
No problem, but Androgel 1% or 1.6%? We can send either one. If they approved Androgel 1%, Megan, you can send that at the same dose. If 1.6%, let me know.

## 2015-03-16 NOTE — Telephone Encounter (Signed)
Actually, Megan, I just now saw that he is on 2% testosterone gel. I think I will let Dr. Loanne Drilling decide on this one since Androgel only comes in 1 and 1.6%.

## 2015-03-18 ENCOUNTER — Other Ambulatory Visit: Payer: Self-pay | Admitting: Family Medicine

## 2015-03-18 MED ORDER — TESTOSTERONE 20.25 MG/ACT (1.62%) TD GEL: 5.0000 | Freq: Every day | TRANSDERMAL | Status: DC

## 2015-03-18 NOTE — Telephone Encounter (Signed)
i printed 

## 2015-03-18 NOTE — Telephone Encounter (Signed)
Rx for androgel sent to pt's pharmacy.

## 2015-03-18 NOTE — Addendum Note (Signed)
Addended by: Renato Shin on: 03/18/2015 07:33 AM   Modules accepted: Orders, Medications

## 2015-04-13 ENCOUNTER — Encounter: Payer: Self-pay | Admitting: Family Medicine

## 2015-04-13 ENCOUNTER — Ambulatory Visit (INDEPENDENT_AMBULATORY_CARE_PROVIDER_SITE_OTHER): Payer: Managed Care, Other (non HMO) | Admitting: Family Medicine

## 2015-04-13 VITALS — BP 130/70 | Temp 98.5°F | Wt 253.0 lb

## 2015-04-13 DIAGNOSIS — E1022 Type 1 diabetes mellitus with diabetic chronic kidney disease: Secondary | ICD-10-CM | POA: Diagnosis not present

## 2015-04-13 DIAGNOSIS — I10 Essential (primary) hypertension: Secondary | ICD-10-CM

## 2015-04-13 DIAGNOSIS — N189 Chronic kidney disease, unspecified: Secondary | ICD-10-CM

## 2015-04-13 LAB — BASIC METABOLIC PANEL
BUN: 28 mg/dL — AB (ref 6–23)
CALCIUM: 9.2 mg/dL (ref 8.4–10.5)
CHLORIDE: 105 meq/L (ref 96–112)
CO2: 31 mEq/L (ref 19–32)
Creatinine, Ser: 1.54 mg/dL — ABNORMAL HIGH (ref 0.40–1.50)
GFR: 48.4 mL/min — AB (ref >60.00)
GLUCOSE: 192 mg/dL — AB (ref 70–99)
POTASSIUM: 4.6 meq/L (ref 3.5–5.1)
Sodium: 139 mEq/L (ref 135–145)

## 2015-04-13 LAB — HEMOGLOBIN A1C: Hgb A1c MFr Bld: 7.1 % — ABNORMAL HIGH (ref 4.6–6.5)

## 2015-04-13 NOTE — Progress Notes (Signed)
Pre visit review using our clinic review tool, if applicable. No additional management support is needed unless otherwise documented below in the visit note. 

## 2015-04-13 NOTE — Patient Instructions (Signed)
Continue current medication  We'll get an A1c today  Follow-up February 2017 for your annual general physical exam............. 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

## 2015-04-13 NOTE — Progress Notes (Signed)
   Subjective:    Patient ID: Bryan Haley, male    DOB: Sep 08, 1951, 64 y.o.   MRN: [id number]  HPI Bryan Haley is a 64 year old male nonsmoker who comes in today for follow-up of diabetes and hypertension  He's seeing Dr. Loanne Drilling. A1c 2-1/2 months ago was 6.6%.  On Norvasc 10 mg, Coreg 3.125 mg, Cardura 8 mg, Cozaar 100 mg twice a day his blood pressure is 140/65   Review of Systems Review of systems otherwise negative weight stable    Objective:   Physical Exam  Well-developed well-nourished male no acute distress vital signs stable he is afebrile BP right arm sitting position 140/65      Assessment & Plan:  Hypertension at goal.............. continue current therapy  Diabetes type 1............ continue current treatment program follow-up with Dr. Loanne Drilling in June.

## 2015-04-29 ENCOUNTER — Ambulatory Visit: Payer: Managed Care, Other (non HMO) | Admitting: Cardiology

## 2015-05-01 ENCOUNTER — Encounter: Payer: Self-pay | Admitting: Cardiology

## 2015-05-01 ENCOUNTER — Ambulatory Visit (INDEPENDENT_AMBULATORY_CARE_PROVIDER_SITE_OTHER): Payer: Managed Care, Other (non HMO) | Admitting: Cardiology

## 2015-05-01 VITALS — BP 132/60 | HR 55 | Ht 70.0 in | Wt 261.0 lb

## 2015-05-01 DIAGNOSIS — I2510 Atherosclerotic heart disease of native coronary artery without angina pectoris: Secondary | ICD-10-CM | POA: Diagnosis not present

## 2015-05-01 DIAGNOSIS — I10 Essential (primary) hypertension: Secondary | ICD-10-CM

## 2015-05-01 DIAGNOSIS — E785 Hyperlipidemia, unspecified: Secondary | ICD-10-CM | POA: Diagnosis not present

## 2015-05-01 DIAGNOSIS — I440 Atrioventricular block, first degree: Secondary | ICD-10-CM

## 2015-05-01 DIAGNOSIS — I2583 Coronary atherosclerosis due to lipid rich plaque: Secondary | ICD-10-CM

## 2015-05-01 NOTE — Patient Instructions (Signed)
Medication Instructions:  Please stop your Plavix, Imdur and Coreg. Continue all other medications as listed.  Follow-Up: Follow up in 6 months with Dr. Marlou Porch.  You will receive a letter in the mail 2 months before you are due.  Please call us when you receive this letter to schedule your follow up appointment.  Thank you for choosing [facility]!!

## 2015-05-01 NOTE — Progress Notes (Addendum)
[facility]. [address], [place]  [address] Phone: [phone] Fax:  [phone]  Date:  05/01/2015   ID:  Bryan Haley, DOB October 29, 1951, MRN [id number]  PCP:  [name], MD   History of Present Illness: Bryan Haley is a 64 y.o. 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 He underwent successful percutaneous intervention of the proximal PLA as well as the proximal RCA utilizing drug-eluting stents. Dual antiplatelet therapy. Isosorbide, statin. LEFT radial artery approach.  He's been feeling very well since the stent placement. Doing well. No chest pain.  He is still working. He had a foot surgery. Overall no complaints. Taking his medications.   Wt Readings from Last 3 Encounters:  05/01/15 261 lb (118.389 kg)  04/13/15 253 lb (114.76 kg)  03/09/15 253 lb (114.76 kg)     Past Medical History  Diagnosis Date  . HYPOGONADISM 06/11/2010  . GOUT 07/11/2007  . HYPERTENSION 07/06/2007  . COLONIC POLYPS, HX OF 10/02/2008  . RENAL INSUFFICIENCY 12/11/2008  . Hyperlipidemia   . Other and unspecified angina pectoris   . Coronary artery disease   . Exertional shortness of breath     "last 2-3 yrs" (12/04/2013)  . OSA (obstructive sleep apnea) 10/08/2009    wears CPAP "more than 1/2 the time"  . Diabetic retinopathy of both eyes     Laser surgery, Dr. Zadie Rhine  . DM W/COMPLICATION NOS, TYPE I 08/09/2007    "dx'd ~ 1994; I say it's type II" (12/04/2013)  . Coronary atherosclerosis of native coronary artery 12/05/2013    Prox RCA, and PLA DES 12/04/13.     Past Surgical History  Procedure Laterality Date  . Colonoscopy    . Carpal tunnel release Left ~ 2004  . Coronary angioplasty with stent placement  12/04/2013    "2 stents"  . Eye examination under  anesthesia w/ retinal cryotherapy and retinal laser Bilateral 2005-2010    "for my diabetes" (12/04/2013)  . Left heart catheterization with coronary angiogram Bilateral 12/04/2013    Procedure: LEFT HEART CATHETERIZATION WITH CORONARY ANGIOGRAM;  Surgeon: Candee Furbish, MD;  Location: [facility] CATH LAB;  Service: Cardiovascular;  Laterality: Bilateral;    Current Outpatient Prescriptions  Medication Sig Dispense Refill  . allopurinol (ZYLOPRIM) 300 MG tablet Take 1 tablet (300 mg total) by mouth daily. 100 tablet 3  . amLODipine (NORVASC) 10 MG tablet Take 1 tablet (10 mg total) by mouth daily. 90 tablet 3  . aspirin 81 MG chewable tablet Chew 1 tablet (81 mg total) by mouth daily.    . B-D UF III MINI PEN NEEDLES 31G X 5 MM MISC INJECT 1 PEN AS DIRECTED 3 (THREE) TIMES DAILY. USE AS DIRECTED 3X DAILY 100 each 2  . carvedilol (COREG) 3.125 MG tablet Take 1 tablet (3.125 mg total) by mouth daily. 100 tablet 3  . clopidogrel (PLAVIX) 75 MG tablet Take 1 tablet (75 mg total) by mouth daily with breakfast. 100 tablet 3  . doxazosin (CARDURA) 8 MG tablet TAKE 1 TABLET BY MOUTH AT BEDTIME 100 tablet 0  . fluticasone (FLONASE) 50 MCG/ACT nasal spray Place 1 spray into both nostrils daily. 16 g 11  . indomethacin (INDOCIN SR) 75 MG CR capsule TAKE 1 CAPSULE (75 MG TOTAL) BY MOUTH 2 (TWO) TIMES DAILY. 200 capsule 3  .  INS SYRINGE/NEEDLE 1CC/29G (B-D INSULIN SYRINGE) 29G X 1/2" 1 ML MISC Use as directed     . insulin lispro (HUMALOG KWIKPEN) 100 UNIT/ML KiwkPen Inject 0.1 mLs (10 Units total) into the skin 3 (three) times daily. And pen needles 3/day 15 mL 11  . insulin NPH Human (HUMULIN N,NOVOLIN N) 100 UNIT/ML injection Inject 0.1 mLs (10 Units total) into the skin at bedtime. 10 mL 11  . isosorbide mononitrate (IMDUR) 30 MG 24 hr tablet TAKE 1 TABLET BY MOUTH EVERY DAY 90 tablet 1  . losartan (COZAAR) 100 MG tablet TAKE 1 TABLET TWICE A DAY 200 tablet 2  . nitroGLYCERIN (NITROSTAT) 0.4 MG SL tablet Place 1  tablet (0.4 mg total) under the tongue every 5 (five) minutes as needed for chest pain. 30 tablet 3  . ONE TOUCH ULTRA TEST test strip USE TO TEST TWICE A DAY 100 each 1  . simvastatin (ZOCOR) 40 MG tablet Take 1 tablet (40 mg total) by mouth at bedtime. 100 tablet 2  . Testosterone 10 MG/ACT (2%) GEL Apply 1 application topically daily. One squirt on each shoulder daily  5  . lisinopril (PRINIVIL,ZESTRIL) 40 MG tablet      No current facility-administered medications for this visit.    Allergies:   No Known Allergies  Social History:  The patient  reports that he quit smoking about 4 years ago. His smoking use included Cigarettes. He has a 67.5 pack-year smoking history. He has never used smokeless tobacco. He reports that he drinks about 0.6 oz of alcohol per week. He reports that he does not use illicit drugs.   ROS:  Please see the history of present illness.   Denies any bleeding, syncope, orthopnea, PND  PHYSICAL EXAM: VS:  BP 132/60 mmHg  Pulse 55  Ht 5\' 10"  (1.778 m)  Wt 261 lb (118.389 kg)  BMI 37.45 kg/m2 Well nourished, well developed, in no acute distress HEENT: normal Neck: no JVD Cardiac:  normal S1, S2; RRR; no murmur Lungs:  clear to auscultation bilaterally, no wheezing, rhonchi or rales Abd: soft, nontender, no hepatomegaly, obese Ext: no edema Skin: warm and dry Neuro: no focal abnormalities noted  EKG:   Today 05/01/15 Sinus brady 55 with  First degree AV block Nuclear stress 12/14: Intermediate risk, inferolateral reversible defect. Cardiac catheterization 12/04/13-proximal RCA, PLA disease. Status post DES to both regions.  Labs: 05/09/14: Hemoglobin A1c 8.1   ASSESSMENT AND PLAN:  1. Coronary artery disease-status post percutaneous intervention as described above. RCA region. Overall doing well. No exertional anginal symptoms.  It has been greater than one year so Plavix is being stopped continue with aspirin.Marland Kitchen USE LEFT RADIAL in future.  2.  first-degree AV  block/bradycardia-we are discontinuing his beta blocker Coreg 3.125 3. Angina-resolved post percutaneous intervention. Multidrug regimen. NTG rx.  Discontinue Isosorbide.  We are discontinuing his low-dose beta blocker , carvedilol 3.125 mg because of sinus bradycardia rate 55 with first-degree AV block of 298 ms.   4. Hyperlipidemia-continue with statin therapy. LDL goal less than 70. On simvastatin 40 mg. Dr. Sherren Mocha monitoring 5. Hypertension-currently well controlled. 6. Tobacco use-smoking cessation discussed. He quit for quite some time but restarted and then quit again. He understands the importance of cessation. 7. Morbid obesity-continue to encourage weight loss. He is very motivated currently.  He has lost over 40 pounds he states. He has tried [employer] as well as [employer] in the past. They both worked but once he gets off of  the diet plan, his weight increases. 8. 6 month followup.  Signed, Candee Furbish, MD [facility]  05/01/2015 2:44 PM

## 2015-05-05 ENCOUNTER — Encounter: Payer: Self-pay | Admitting: Endocrinology

## 2015-05-05 ENCOUNTER — Ambulatory Visit (INDEPENDENT_AMBULATORY_CARE_PROVIDER_SITE_OTHER): Payer: Managed Care, Other (non HMO) | Admitting: Endocrinology

## 2015-05-05 VITALS — BP 134/70 | HR 62 | Ht 70.0 in | Wt 259.0 lb

## 2015-05-05 DIAGNOSIS — E1022 Type 1 diabetes mellitus with diabetic chronic kidney disease: Secondary | ICD-10-CM | POA: Diagnosis not present

## 2015-05-05 DIAGNOSIS — N189 Chronic kidney disease, unspecified: Secondary | ICD-10-CM

## 2015-05-05 MED ORDER — REPAGLINIDE 2 MG PO TABS: 2.0000 mg | ORAL_TABLET | Freq: Three times a day (TID) | ORAL | Status: DC

## 2015-05-05 NOTE — Progress Notes (Signed)
Subjective:    Patient ID: Bryan Haley, male    DOB: June 22, 1951, 64 y.o.   MRN: [id number]  HPI Pt returns for f/u of diabetes mellitus: DM type: Insulin-requiring type 2 Dx'ed: 9326 Complications: renal insufficiency and CAD Therapy: insulin since 2009. DKA: never Severe hypoglycemia: never. Pancreatitis: never.  Other: insurance declined weight-loss surgery; he takes multiple daily injections; oral rx options are limited by renal insuff and edema.  Interval history:  no cbg record, but states cbg's are in the low to mid-100's.  pt states he feels well in general.  He wants to try oral rx. Past Medical History  Diagnosis Date  . HYPOGONADISM 06/11/2010  . GOUT 07/11/2007  . HYPERTENSION 07/06/2007  . COLONIC POLYPS, HX OF 10/02/2008  . RENAL INSUFFICIENCY 12/11/2008  . Hyperlipidemia   . Other and unspecified angina pectoris   . Coronary artery disease   . Exertional shortness of breath     "last 2-3 yrs" (12/04/2013)  . OSA (obstructive sleep apnea) 10/08/2009    wears CPAP "more than 1/2 the time"  . Diabetic retinopathy of both eyes     Laser surgery, Dr. Zadie Rhine  . DM W/COMPLICATION NOS, TYPE I 08/09/2007    "dx'd ~ 1994; I say it's type II" (12/04/2013)  . Coronary atherosclerosis of native coronary artery 12/05/2013    Prox RCA, and PLA DES 12/04/13.     Past Surgical History  Procedure Laterality Date  . Colonoscopy    . Carpal tunnel release Left ~ 2004  . Coronary angioplasty with stent placement  12/04/2013    "2 stents"  . Eye examination under anesthesia w/ retinal cryotherapy and retinal laser Bilateral 2005-2010    "for my diabetes" (12/04/2013)  . Left heart catheterization with coronary angiogram Bilateral 12/04/2013    Procedure: LEFT HEART CATHETERIZATION WITH CORONARY ANGIOGRAM;  Surgeon: Candee Furbish, MD;  Location: [facility] CATH LAB;  Service: Cardiovascular;  Laterality: Bilateral;    History   Social History  . Marital Status: Married    Spouse Name: N/A  .  Number of Children: N/A  . Years of Education: N/A   Occupational History  . sales    Social History Main Topics  . Smoking status: Former Smoker -- 1.50 packs/day for 45 years    Types: Cigarettes    Quit date: 10/02/2010  . Smokeless tobacco: Never Used  . Alcohol Use: 0.6 oz/week    1 Shots of liquor per week  . Drug Use: No  . Sexual Activity: Yes   Other Topics Concern  . Not on file   Social History Narrative   Regular exercise-no   Pt went to seminar to quit smoking    Current Outpatient Prescriptions on File Prior to Visit  Medication Sig Dispense Refill  . allopurinol (ZYLOPRIM) 300 MG tablet Take 1 tablet (300 mg total) by mouth daily. 100 tablet 3  . amLODipine (NORVASC) 10 MG tablet Take 1 tablet (10 mg total) by mouth daily. 90 tablet 3  . aspirin 81 MG chewable tablet Chew 1 tablet (81 mg total) by mouth daily.    . B-D UF III MINI PEN NEEDLES 31G X 5 MM MISC INJECT 1 PEN AS DIRECTED 3 (THREE) TIMES DAILY. USE AS DIRECTED 3X DAILY 100 each 2  . doxazosin (CARDURA) 8 MG tablet TAKE 1 TABLET BY MOUTH AT BEDTIME 100 tablet 0  . fluticasone (FLONASE) 50 MCG/ACT nasal spray Place 1 spray into both nostrils daily. 16 g 11  .  indomethacin (INDOCIN SR) 75 MG CR capsule TAKE 1 CAPSULE (75 MG TOTAL) BY MOUTH 2 (TWO) TIMES DAILY. 200 capsule 3  . INS SYRINGE/NEEDLE 1CC/29G (B-D INSULIN SYRINGE) 29G X 1/2" 1 ML MISC Use as directed     . lisinopril (PRINIVIL,ZESTRIL) 40 MG tablet     . losartan (COZAAR) 100 MG tablet TAKE 1 TABLET TWICE A DAY 200 tablet 2  . nitroGLYCERIN (NITROSTAT) 0.4 MG SL tablet Place 1 tablet (0.4 mg total) under the tongue every 5 (five) minutes as needed for chest pain. 30 tablet 3  . ONE TOUCH ULTRA TEST test strip USE TO TEST TWICE A DAY 100 each 1  . simvastatin (ZOCOR) 40 MG tablet Take 1 tablet (40 mg total) by mouth at bedtime. 100 tablet 2  . Testosterone 10 MG/ACT (2%) GEL Apply 1 application topically daily. One squirt on each shoulder daily   5   No current facility-administered medications on file prior to visit.    No Known Allergies  Family History  Problem Relation Age of Onset  . Cancer Neg Hx     No family history of colon cancer  . Colon cancer Neg Hx   . Esophageal cancer Neg Hx   . Stomach cancer Neg Hx   . Rectal cancer Neg Hx   . Diabetes Other   . Hyperlipidemia Other   . Hypertension Other   . Heart disease Father   . Heart attack Father   . Stroke Neg Hx     BP 134/70 mmHg  Pulse 62  Ht 5\' 10"  (1.778 m)  Wt 259 lb (117.482 kg)  BMI 37.16 kg/m2  SpO2 93%    Review of Systems He has gained a few lbs.    Objective:   Physical Exam VITAL SIGNS:  See vs page GENERAL: no distress.  Pulses: dorsalis pedis intact bilat.   MSK: no deformity of the feet.   CV: 1+ right leg edema.  Skin:  no ulcer on the feet.  normal color and temp on the feet.  Neuro: sensation is intact to touch on the feet.     Lab Results  Component Value Date   HGBA1C 7.1* 04/13/2015      Assessment & Plan:  DM: well-controlled.  He is ready for a trial of oral rx  Patient is advised the following: Patient Instructions  Please come back for a follow-up appointment in 3 months.   check your blood sugar twice a day.  vary the time of day when you check, between before the 3 meals, and at bedtime.  also check if you have symptoms of your blood sugar being too high or too low.  please keep a record of the readings and bring it to your next appointment here.  You can write it on any piece of paper.  please call us sooner if your blood sugar goes below 70, or if you have a lot of readings over 200.  PIease change the insulin to a pill called "repaglinide."  i have sent a prescription to your pharmacy.   Please call if it is high, so we can add "bromocriptine."

## 2015-05-05 NOTE — Patient Instructions (Addendum)
Please come back for a follow-up appointment in 3 months.   check your blood sugar twice a day.  vary the time of day when you check, between before the 3 meals, and at bedtime.  also check if you have symptoms of your blood sugar being too high or too low.  please keep a record of the readings and bring it to your next appointment here.  You can write it on any piece of paper.  please call us sooner if your blood sugar goes below 70, or if you have a lot of readings over 200.  PIease change the insulin to a pill called "repaglinide."  i have sent a prescription to your pharmacy.   Please call if it is high, so we can add "bromocriptine."

## 2015-05-07 ENCOUNTER — Other Ambulatory Visit: Payer: Self-pay | Admitting: Endocrinology

## 2015-05-07 ENCOUNTER — Other Ambulatory Visit: Payer: Self-pay | Admitting: Family Medicine

## 2015-05-16 ENCOUNTER — Other Ambulatory Visit: Payer: Self-pay | Admitting: Cardiology

## 2015-05-22 ENCOUNTER — Encounter: Payer: Self-pay | Admitting: Endocrinology

## 2015-05-25 ENCOUNTER — Other Ambulatory Visit: Payer: Self-pay | Admitting: Endocrinology

## 2015-05-25 ENCOUNTER — Other Ambulatory Visit: Payer: Self-pay

## 2015-05-25 MED ORDER — BROMOCRIPTINE MESYLATE 2.5 MG PO TABS: 2.5000 mg | ORAL_TABLET | Freq: Every day | ORAL | Status: DC

## 2015-06-16 ENCOUNTER — Other Ambulatory Visit: Payer: Self-pay | Admitting: Endocrinology

## 2015-06-18 ENCOUNTER — Other Ambulatory Visit: Payer: Self-pay | Admitting: Family Medicine

## 2015-07-09 ENCOUNTER — Encounter: Payer: Self-pay | Admitting: Family Medicine

## 2015-07-24 ENCOUNTER — Other Ambulatory Visit: Payer: Self-pay | Admitting: Family Medicine

## 2015-08-05 ENCOUNTER — Ambulatory Visit (INDEPENDENT_AMBULATORY_CARE_PROVIDER_SITE_OTHER): Payer: Managed Care, Other (non HMO) | Admitting: Endocrinology

## 2015-08-05 ENCOUNTER — Encounter: Payer: Self-pay | Admitting: Endocrinology

## 2015-08-05 VITALS — BP 139/84 | HR 59 | Temp 97.9°F | Ht 70.0 in | Wt 266.0 lb

## 2015-08-05 DIAGNOSIS — E1065 Type 1 diabetes mellitus with hyperglycemia: Secondary | ICD-10-CM

## 2015-08-05 DIAGNOSIS — IMO0002 Reserved for concepts with insufficient information to code with codable children: Secondary | ICD-10-CM

## 2015-08-05 DIAGNOSIS — Z23 Encounter for immunization: Secondary | ICD-10-CM | POA: Diagnosis not present

## 2015-08-05 LAB — POCT GLYCOSYLATED HEMOGLOBIN (HGB A1C): Hemoglobin A1C: 8.2

## 2015-08-05 MED ORDER — SITAGLIPTIN PHOSPHATE 50 MG PO TABS: 50.0000 mg | ORAL_TABLET | Freq: Every day | ORAL | Status: DC

## 2015-08-05 MED ORDER — LINAGLIPTIN 5 MG PO TABS: 5.0000 mg | ORAL_TABLET | Freq: Every day | ORAL | Status: DC

## 2015-08-05 NOTE — Progress Notes (Signed)
Subjective:    Patient ID: Bryan Haley, male    DOB: [birth year], 64 y.o.   MRN: [id number]  HPI Pt returns for f/u of diabetes mellitus: DM type: Insulin-requiring type 2 Dx'ed: 3810 Complications: renal insufficiency and CAD. Therapy: 2 oral meds.   DKA: never. Severe hypoglycemia: never. Pancreatitis: never.  Other: insurance declined weight-loss surgery; he took insulin 2009-2016; oral rx options are limited by renal insuff and edema.   Interval history:  no cbg record, but states cbg's are in the low-200's.  pt states he feels well in general.  Past Medical History  Diagnosis Date  . HYPOGONADISM 06/11/2010  . GOUT 07/11/2007  . HYPERTENSION 07/06/2007  . COLONIC POLYPS, HX OF 10/02/2008  . RENAL INSUFFICIENCY 12/11/2008  . Hyperlipidemia   . Other and unspecified angina pectoris   . Coronary artery disease   . Exertional shortness of breath     "last 2-3 yrs" (12/04/2013)  . OSA (obstructive sleep apnea) 10/08/2009    wears CPAP "more than 1/2 the time"  . Diabetic retinopathy of both eyes     Laser surgery, Dr. Zadie Rhine  . DM W/COMPLICATION NOS, TYPE I 08/09/2007    "dx'd ~ 1994; I say it's type II" (12/04/2013)  . Coronary atherosclerosis of native coronary artery 12/05/2013    Prox RCA, and PLA DES 12/04/13.     Past Surgical History  Procedure Laterality Date  . Colonoscopy    . Carpal tunnel release Left ~ 2004  . Coronary angioplasty with stent placement  12/04/2013    "2 stents"  . Eye examination under anesthesia w/ retinal cryotherapy and retinal laser Bilateral 2005-2010    "for my diabetes" (12/04/2013)  . Left heart catheterization with coronary angiogram Bilateral 12/04/2013    Procedure: LEFT HEART CATHETERIZATION WITH CORONARY ANGIOGRAM;  Surgeon: Candee Furbish, MD;  Location: [facility] CATH LAB;  Service: Cardiovascular;  Laterality: Bilateral;    Social History   Social History  . Marital Status: Married    Spouse Name: N/A  . Number of Children: N/A  . Years of  Education: N/A   Occupational History  . sales    Social History Main Topics  . Smoking status: Former Smoker -- 1.50 packs/day for 45 years    Types: Cigarettes    Quit date: 10/02/2010  . Smokeless tobacco: Never Used  . Alcohol Use: 0.6 oz/week    1 Shots of liquor per week  . Drug Use: No  . Sexual Activity: Yes   Other Topics Concern  . Not on file   Social History Narrative   Regular exercise-no   Pt went to seminar to quit smoking    Current Outpatient Prescriptions on File Prior to Visit  Medication Sig Dispense Refill  . allopurinol (ZYLOPRIM) 300 MG tablet Take 1 tablet (300 mg total) by mouth daily. 100 tablet 3  . amLODipine (NORVASC) 10 MG tablet Take 1 tablet (10 mg total) by mouth daily. 90 tablet 3  . ANDROGEL PUMP 20.25 MG/ACT (1.62%) GEL PLACE 5 SQUIRTS ONTO THE SKIN DAILY 75 g 2  . aspirin 81 MG chewable tablet Chew 1 tablet (81 mg total) by mouth daily.    . B-D UF III MINI PEN NEEDLES 31G X 5 MM MISC INJECT 1 PEN AS DIRECTED 3 (THREE) TIMES DAILY. USE AS DIRECTED 3X DAILY 100 each 2  . bromocriptine (PARLODEL) 2.5 MG tablet Take 1 tablet (2.5 mg total) by mouth at bedtime. 30 tablet 11  . doxazosin (CARDURA)  8 MG tablet TAKE 1 TABLET BY MOUTH AT BEDTIME 100 tablet 1  . fluticasone (FLONASE) 50 MCG/ACT nasal spray PLACE 1 SPRAY INTO BOTH NOSTRILS DAILY. 16 g 3  . glucose blood (ONE TOUCH ULTRA TEST) test strip Use to test blood sugar twice daily. Dx: E10.22 100 each 2  . indomethacin (INDOCIN SR) 75 MG CR capsule TAKE 1 CAPSULE (75 MG TOTAL) BY MOUTH 2 (TWO) TIMES DAILY. 200 capsule 3  . INS SYRINGE/NEEDLE 1CC/29G (B-D INSULIN SYRINGE) 29G X 1/2" 1 ML MISC Use as directed     . losartan (COZAAR) 100 MG tablet TAKE 1 TABLET TWICE A DAY 200 tablet 1  . nitroGLYCERIN (NITROSTAT) 0.4 MG SL tablet Place 1 tablet (0.4 mg total) under the tongue every 5 (five) minutes as needed for chest pain. 30 tablet 3  . repaglinide (PRANDIN) 2 MG tablet Take 1 tablet (2 mg  total) by mouth 3 (three) times daily before meals. 90 tablet 11  . simvastatin (ZOCOR) 40 MG tablet Take 1 tablet (40 mg total) by mouth at bedtime. 100 tablet 2  . Testosterone 10 MG/ACT (2%) GEL Apply 1 application topically daily. One squirt on each shoulder daily  5  . isosorbide mononitrate (IMDUR) 30 MG 24 hr tablet TAKE 1 TABLET BY MOUTH EVERY DAY (Patient not taking: Reported on 08/05/2015) 90 tablet 3  . lisinopril (PRINIVIL,ZESTRIL) 40 MG tablet      No current facility-administered medications on file prior to visit.    No Known Allergies  Family History  Problem Relation Age of Onset  . Cancer Neg Hx     No family history of colon cancer  . Colon cancer Neg Hx   . Esophageal cancer Neg Hx   . Stomach cancer Neg Hx   . Rectal cancer Neg Hx   . Diabetes Other   . Hyperlipidemia Other   . Hypertension Other   . Heart disease Father   . Heart attack Father   . Stroke Neg Hx     BP 139/84 mmHg  Pulse 59  Temp(Src) 97.9 F (36.6 C) (Oral)  Ht 5\' 10"  (1.778 m)  Wt 266 lb (120.657 kg)  BMI 38.17 kg/m2  SpO2 96%   Review of Systems He denies hypoglycemia    Objective:   Physical Exam VITAL SIGNS: See vs page GENERAL: no distress.  Pulses: dorsalis pedis intact bilat.  MSK: no deformity of the feet.  CV: 1+ right leg edema.  Skin: no ulcer on the feet. normal color and temp on the feet. Old healed surgical scar at the right medial malleolus.   Neuro: sensation is intact to touch on the feet.   Lab Results  Component Value Date   CREATININE 1.54* 04/13/2015   BUN 28* 04/13/2015   NA 139 04/13/2015   K 4.6 04/13/2015   CL 105 04/13/2015   CO2 31 04/13/2015  a1c=8.2%    Assessment & Plan:  DM: he needs increased rx. Renal insufficiency: this limits oral rx options. Edema: this also limits oral dm rx options.   Patient is advised the following: Patient Instructions  Please come back for a follow-up appointment in 3 months.   check your blood  sugar twice a day.  vary the time of day when you check, between before the 3 meals, and at bedtime.  also check if you have symptoms of your blood sugar being too high or too low.  please keep a record of the readings and bring it to  your next appointment here.  You can write it on any piece of paper.  please call us sooner if your blood sugar goes below 70, or if you have a lot of readings over 200.  i have sent a prescription to your pharmacy, to add "tradjenta."  Please call if the blood sugar stays high, so we can add another medication.

## 2015-08-05 NOTE — Patient Instructions (Addendum)
Please come back for a follow-up appointment in 3 months.   check your blood sugar twice a day.  vary the time of day when you check, between before the 3 meals, and at bedtime.  also check if you have symptoms of your blood sugar being too high or too low.  please keep a record of the readings and bring it to your next appointment here.  You can write it on any piece of paper.  please call us sooner if your blood sugar goes below 70, or if you have a lot of readings over 200.  i have sent a prescription to your pharmacy, to add "tradjenta."  Please call if the blood sugar stays high, so we can add another medication.

## 2015-09-22 ENCOUNTER — Other Ambulatory Visit: Payer: Self-pay | Admitting: Family Medicine

## 2015-09-26 ENCOUNTER — Other Ambulatory Visit: Payer: Self-pay | Admitting: Endocrinology

## 2015-09-26 ENCOUNTER — Other Ambulatory Visit: Payer: Self-pay | Admitting: Family Medicine

## 2015-09-30 ENCOUNTER — Other Ambulatory Visit: Payer: Self-pay | Admitting: Endocrinology

## 2015-09-30 NOTE — Telephone Encounter (Signed)
This was refilled 2 days ago

## 2015-10-05 ENCOUNTER — Telehealth: Payer: Self-pay | Admitting: Family Medicine

## 2015-10-05 DIAGNOSIS — H34212 Partial retinal artery occlusion, left eye: Secondary | ICD-10-CM

## 2015-10-05 NOTE — Telephone Encounter (Signed)
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 Drr Baird Cancer wants to know if Dr Sherren Mocha would do a carotid ultrasound at next appt.  Advise Claiborne Billings we do not do those,  But would certainly pass message to Dr Sherren Mocha.

## 2015-10-07 NOTE — Telephone Encounter (Signed)
Order placed and patient is aware

## 2015-10-10 LAB — HM DIABETES EYE EXAM

## 2015-10-13 ENCOUNTER — Ambulatory Visit (HOSPITAL_COMMUNITY)
Admission: RE | Admit: 2015-10-13 | Discharge: 2015-10-13 | Disposition: A | Discharge status: Home / Self Care | Payer: Managed Care, Other (non HMO) | Source: Ambulatory Visit | Attending: Family Medicine | Admitting: Family Medicine

## 2015-10-13 DIAGNOSIS — I779 Disorder of arteries and arterioles, unspecified: Secondary | ICD-10-CM | POA: Diagnosis not present

## 2015-10-13 DIAGNOSIS — H34212 Partial retinal artery occlusion, left eye: Secondary | ICD-10-CM | POA: Insufficient documentation

## 2015-10-13 NOTE — Progress Notes (Signed)
Preliminary results by tech - Carotid Duplex Completed. Mild plaque in both ICAs with no evidence of a significant stenosis 1-39%. Vertebral arteries demonstrate normal antegrade flow. Oda Cogan, BS, RDMS, RVT

## 2015-10-28 ENCOUNTER — Ambulatory Visit: Payer: Managed Care, Other (non HMO) | Admitting: Cardiology

## 2015-10-29 ENCOUNTER — Ambulatory Visit (INDEPENDENT_AMBULATORY_CARE_PROVIDER_SITE_OTHER): Payer: Managed Care, Other (non HMO) | Admitting: Cardiology

## 2015-10-29 ENCOUNTER — Encounter: Payer: Self-pay | Admitting: Cardiology

## 2015-10-29 VITALS — BP 124/64 | HR 64 | Ht 70.0 in | Wt 266.4 lb

## 2015-10-29 DIAGNOSIS — I10 Essential (primary) hypertension: Secondary | ICD-10-CM | POA: Diagnosis not present

## 2015-10-29 DIAGNOSIS — I208 Other forms of angina pectoris: Secondary | ICD-10-CM | POA: Diagnosis not present

## 2015-10-29 DIAGNOSIS — I440 Atrioventricular block, first degree: Secondary | ICD-10-CM | POA: Diagnosis not present

## 2015-10-29 DIAGNOSIS — E785 Hyperlipidemia, unspecified: Secondary | ICD-10-CM | POA: Diagnosis not present

## 2015-10-29 DIAGNOSIS — Z87891 Personal history of nicotine dependence: Secondary | ICD-10-CM

## 2015-10-29 NOTE — Progress Notes (Signed)
[facility] [id number], [address] [place], [place]  [address] Phone: [phone] Fax:  [phone]  Date:  10/29/2015   ID:  Bryan Haley, DOB [birth year], MRN [id number]  PCP:  [name], MD   History of Present Illness: Bryan Haley is a 64 y.o. 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 He underwent successful percutaneous intervention of the proximal PLA as well as the proximal RCA utilizing drug-eluting stents. Dual antiplatelet therapy. Isosorbide, statin. LEFT radial artery approach.  He's been feeling very well since the stent placement. Doing well. No chest pain.  He is still working.  Overall no complaints. Taking his medications. Now on ARB off ACE-I. Been off of Plavix. Now on ASA only. Mild SOB with extreme activity. R>L LE edema, prior surg. No tob. Chantix worked.    Wt Readings from Last 3 Encounters:  10/29/15 266 lb 6.4 oz (120.838 kg)  08/05/15 266 lb (120.657 kg)  05/05/15 259 lb (117.482 kg)     Past Medical History  Diagnosis Date  . HYPOGONADISM 06/11/2010  . GOUT 07/11/2007  . HYPERTENSION 07/06/2007  . COLONIC POLYPS, HX OF 10/02/2008  . RENAL INSUFFICIENCY 12/11/2008  . Hyperlipidemia   . Other and unspecified angina pectoris   . Coronary artery disease   . Exertional shortness of breath     "last 2-3 yrs" (12/04/2013)  . OSA (obstructive sleep apnea) 10/08/2009    wears CPAP "more than 1/2 the time"  . Diabetic retinopathy of both eyes ([place])     Laser surgery, Dr. Zadie Rhine  . DM W/COMPLICATION NOS, TYPE I 08/09/2007    "dx'd ~ 1994; I say it's type II" (12/04/2013)  . Coronary atherosclerosis of native coronary artery 12/05/2013    Prox RCA, and PLA DES 12/04/13.     Past Surgical History  Procedure Laterality Date  . Colonoscopy     . Carpal tunnel release Left ~ 2004  . Coronary angioplasty with stent placement  12/04/2013    "2 stents"  . Eye examination under anesthesia w/ retinal cryotherapy and retinal laser Bilateral 2005-2010    "for my diabetes" (12/04/2013)  . Left heart catheterization with coronary angiogram Bilateral 12/04/2013    Procedure: LEFT HEART CATHETERIZATION WITH CORONARY ANGIOGRAM;  Surgeon: Candee Furbish, MD;  Location: [facility] CATH LAB;  Service: Cardiovascular;  Laterality: Bilateral;    Current Outpatient Prescriptions  Medication Sig Dispense Refill  . allopurinol (ZYLOPRIM) 300 MG tablet Take 1 tablet (300 mg total) by mouth daily. 100 tablet 3  . amLODipine (NORVASC) 10 MG tablet TAKE 1 TABLET BY MOUTH EVERY DAY 100 tablet 2  . ANDROGEL PUMP 20.25 MG/ACT (1.62%) GEL PUMP 5 TIMES ONTO THE SKIN EVERY DAY 75 g 2  . aspirin 81 MG chewable tablet Chew 1 tablet (81 mg total) by mouth daily.    . B-D UF III MINI PEN NEEDLES 31G X 5 MM MISC INJECT 1 PEN AS DIRECTED 3 (THREE) TIMES DAILY. USE AS DIRECTED 3X DAILY 100 each 2  . bromocriptine (PARLODEL) 2.5 MG tablet Take 1 tablet (2.5 mg total) by mouth at bedtime. 30 tablet 11  . doxazosin (CARDURA) 8 MG tablet TAKE 1 TABLET BY MOUTH AT BEDTIME 100 tablet 1  . fluticasone (FLONASE) 50 MCG/ACT nasal spray PLACE 1 SPRAY INTO BOTH NOSTRILS DAILY. 16 g 3  .  glucose blood (ONE TOUCH ULTRA TEST) test strip Use to test blood sugar twice daily. Dx: E10.22 100 each 2  . indomethacin (INDOCIN SR) 75 MG CR capsule TAKE 1 CAPSULE (75 MG TOTAL) BY MOUTH 2 (TWO) TIMES DAILY. 200 capsule 3  . linagliptin (TRADJENTA) 5 MG TABS tablet Take 1 tablet (5 mg total) by mouth daily. 30 tablet 11  . losartan (COZAAR) 100 MG tablet TAKE 1 TABLET TWICE A DAY 200 tablet 1  . ONE TOUCH ULTRA TEST test strip USE TO TEST TWICE A DAY 100 each 3  . repaglinide (PRANDIN) 2 MG tablet Take 1 tablet (2 mg total) by mouth 3 (three) times daily before meals. 90 tablet 11  . simvastatin (ZOCOR) 40  MG tablet Take 1 tablet (40 mg total) by mouth at bedtime. 100 tablet 2   No current facility-administered medications for this visit.    Allergies:   No Known Allergies  Social History:  The patient  reports that he quit smoking about 5 years ago. His smoking use included Cigarettes. He has a 67.5 pack-year smoking history. He has never used smokeless tobacco. He reports that he drinks about 0.6 oz of alcohol per week. He reports that he does not use illicit drugs.   ROS:  Please see the history of present illness.   Denies any bleeding, syncope, orthopnea, PND  PHYSICAL EXAM: VS:  BP 124/64 mmHg  Pulse 64  Ht 5\' 10"  (1.778 m)  Wt 266 lb 6.4 oz (120.838 kg)  BMI 38.22 kg/m2 Well nourished, well developed, in no acute distress HEENT: normal Neck: no JVD Cardiac:  normal S1, S2; RRR; soft systolic murmur LLSB Lungs:  clear to auscultation bilaterally, no wheezing, rhonchi or rales Abd: soft, nontender, no hepatomegaly, obese Ext: trace edema B Skin: warm and dry Neuro: no focal abnormalities noted  EKG:   Today 05/01/15 Sinus brady 55 with  First degree AV block  Nuclear stress 12/14: Intermediate risk, inferolateral reversible defect.  Cardiac catheterization 12/04/13-proximal RCA, PLA disease. Status post DES to both regions.  Labs: 05/09/14: Hemoglobin A1c 8.1   ASSESSMENT AND PLAN:  1. Coronary artery disease-status post percutaneous intervention as described above. RCA region. Overall doing well. No exertional anginal symptoms.  USE LEFT RADIAL in future.  2. First-degree AV block/bradycardia-we have discontinued his beta blocker Coreg 3.125. Doing well now. 3. Angina-resolved post percutaneous intervention. Multidrug regimen. NTG rx.  Discontinued Isosorbide.  Discontinued his low-dose beta blocker , carvedilol 3.125 mg because of sinus bradycardia rate 55 with first-degree AV block of 298 ms.   4. Hyperlipidemia-continue with statin therapy. Last LDL was 48. Goal less than 70.  On simvastatin 40 mg. Dr. Sherren Mocha monitoring. Checking soon. 5. Hypertension-currently well controlled. 6. Tobacco use-WAS ABLE TO QUIT on Chantix. 7. Morbid obesity-continue to encourage weight loss. He is very motivated currently.  He has lost over 40 pounds he states. He has tried [employer]ystem as well as [employer] in the past. They both worked but once he gets off of the diet plan, his weight increases. 8. 12 month followup.  Signed, Candee Furbish, MD [facility]  10/29/2015 8:36 AM

## 2015-10-29 NOTE — Patient Instructions (Signed)

## 2015-11-02 ENCOUNTER — Other Ambulatory Visit (INDEPENDENT_AMBULATORY_CARE_PROVIDER_SITE_OTHER): Payer: Managed Care, Other (non HMO)

## 2015-11-02 DIAGNOSIS — Z0000 Encounter for general adult medical examination without abnormal findings: Secondary | ICD-10-CM | POA: Diagnosis not present

## 2015-11-02 DIAGNOSIS — R7989 Other specified abnormal findings of blood chemistry: Secondary | ICD-10-CM | POA: Diagnosis not present

## 2015-11-02 LAB — CBC WITH DIFFERENTIAL/PLATELET
BASOS PCT: 0.2 % (ref 0.0–3.0)
Basophils Absolute: 0 10*3/uL (ref 0.0–0.1)
EOS ABS: 0.1 10*3/uL (ref 0.0–0.7)
EOS PCT: 1 % (ref 0.0–5.0)
HCT: 43.3 % (ref 39.0–52.0)
Hemoglobin: 14.5 g/dL (ref 13.0–17.0)
LYMPHS ABS: 1.3 10*3/uL (ref 0.7–4.0)
Lymphocytes Relative: 18.5 % (ref 12.0–46.0)
MCHC: 33.4 g/dL (ref 30.0–36.0)
MCV: 93.8 fl (ref 78.0–100.0)
MONO ABS: 0.5 10*3/uL (ref 0.1–1.0)
Monocytes Relative: 6.8 % (ref 3.0–12.0)
Neutro Abs: 5 10*3/uL (ref 1.4–7.7)
Neutrophils Relative %: 73.5 % (ref 43.0–77.0)
Platelets: 142 10*3/uL — ABNORMAL LOW (ref 150.0–400.0)
RBC: 4.62 Mil/uL (ref 4.22–5.81)
RDW: 14 % (ref 11.5–15.5)
WBC: 6.8 10*3/uL (ref 4.0–10.5)

## 2015-11-02 LAB — POCT URINALYSIS DIPSTICK
Bilirubin, UA: NEGATIVE
KETONES UA: NEGATIVE
Leukocytes, UA: NEGATIVE
Nitrite, UA: NEGATIVE
PH UA: 5.5
Spec Grav, UA: >=1.03
Urobilinogen, UA: 0.2

## 2015-11-02 LAB — LIPID PANEL
CHOLESTEROL: 158 mg/dL (ref 0–200)
HDL: 29.6 mg/dL — AB (ref >39.00)
Total CHOL/HDL Ratio: 5

## 2015-11-02 LAB — LDL CHOLESTEROL, DIRECT: LDL DIRECT: 34 mg/dL

## 2015-11-02 LAB — BASIC METABOLIC PANEL
BUN: 18 mg/dL (ref 6–23)
CO2: 29 mEq/L (ref 19–32)
CREATININE: 1.51 mg/dL — AB (ref 0.40–1.50)
Calcium: 8.8 mg/dL (ref 8.4–10.5)
Chloride: 104 mEq/L (ref 96–112)
GFR: 49.43 mL/min — AB (ref >60.00)
GLUCOSE: 322 mg/dL — AB (ref 70–99)
POTASSIUM: 4.4 meq/L (ref 3.5–5.1)
Sodium: 141 mEq/L (ref 135–145)

## 2015-11-02 LAB — HEMOGLOBIN A1C: Hgb A1c MFr Bld: 9.3 % — ABNORMAL HIGH (ref 4.6–6.5)

## 2015-11-02 LAB — HEPATIC FUNCTION PANEL
ALBUMIN: 3.6 g/dL (ref 3.5–5.2)
ALT: 22 U/L (ref 0–53)
AST: 17 U/L (ref 0–37)
Alkaline Phosphatase: 94 U/L (ref 39–117)
Bilirubin, Direct: 0.1 mg/dL (ref 0.0–0.3)
TOTAL PROTEIN: 6.1 g/dL (ref 6.0–8.3)
Total Bilirubin: 0.5 mg/dL (ref 0.2–1.2)

## 2015-11-02 LAB — TSH: TSH: 3.27 u[IU]/mL (ref 0.35–4.50)

## 2015-11-02 LAB — MICROALBUMIN / CREATININE URINE RATIO
Creatinine,U: 103.8 mg/dL
MICROALB/CREAT RATIO: 92 mg/g — AB (ref 0.0–30.0)
Microalb, Ur: 95.5 mg/dL — ABNORMAL HIGH (ref 0.0–1.9)

## 2015-11-02 LAB — PSA: PSA: 1.27 ng/mL (ref 0.10–4.00)

## 2015-11-04 ENCOUNTER — Encounter: Payer: Self-pay | Admitting: Endocrinology

## 2015-11-04 ENCOUNTER — Ambulatory Visit (INDEPENDENT_AMBULATORY_CARE_PROVIDER_SITE_OTHER): Payer: Managed Care, Other (non HMO) | Admitting: Endocrinology

## 2015-11-04 VITALS — BP 132/67 | HR 62 | Temp 97.9°F | Ht 70.0 in | Wt 267.0 lb

## 2015-11-04 DIAGNOSIS — N182 Chronic kidney disease, stage 2 (mild): Secondary | ICD-10-CM

## 2015-11-04 DIAGNOSIS — E119 Type 2 diabetes mellitus without complications: Secondary | ICD-10-CM | POA: Insufficient documentation

## 2015-11-04 DIAGNOSIS — E1122 Type 2 diabetes mellitus with diabetic chronic kidney disease: Secondary | ICD-10-CM

## 2015-11-04 MED ORDER — BROMOCRIPTINE MESYLATE 5 MG PO CAPS: 5.0000 mg | ORAL_CAPSULE | Freq: Every day | ORAL | Status: DC

## 2015-11-04 MED ORDER — REPAGLINIDE 2 MG PO TABS: 4.0000 mg | ORAL_TABLET | Freq: Three times a day (TID) | ORAL | Status: DC

## 2015-11-04 MED ORDER — CANAGLIFLOZIN 100 MG PO TABS: 100.0000 mg | ORAL_TABLET | Freq: Every day | ORAL | Status: DC

## 2015-11-04 NOTE — Progress Notes (Signed)
Subjective:    Patient ID: Bryan Haley, male    DOB: [birth year], 64 y.o.   MRN: [id number]  HPI Pt returns for f/u of diabetes mellitus: DM type: Insulin-requiring type 2 Dx'ed: 99991111 Complications: renal insufficiency and CAD. Therapy: 3 oral meds.   DKA: never. Severe hypoglycemia: never. Pancreatitis: never.  Other: insurance declined weight-loss surgery; he took insulin 2009-2016; oral rx options are limited by renal insuff and edema.   Interval history:  no cbg record, but states cbg's are in the 200's.  pt states he feels well in general, but diet has been worse recently.   Past Medical History  Diagnosis Date  . HYPOGONADISM 06/11/2010  . GOUT 07/11/2007  . HYPERTENSION 07/06/2007  . COLONIC POLYPS, HX OF 10/02/2008  . RENAL INSUFFICIENCY 12/11/2008  . Hyperlipidemia   . Other and unspecified angina pectoris   . Coronary artery disease   . Exertional shortness of breath     "last 2-3 yrs" (12/04/2013)  . OSA (obstructive sleep apnea) 10/08/2009    wears CPAP "more than 1/2 the time"  . Diabetic retinopathy of both eyes ([place])     Laser surgery, Dr. Zadie Rhine  . DM W/COMPLICATION NOS, TYPE I 08/09/2007    "dx'd ~ 1994; I say it's type II" (12/04/2013)  . Coronary atherosclerosis of native coronary artery 12/05/2013    Prox RCA, and PLA DES 12/04/13.     Past Surgical History  Procedure Laterality Date  . Colonoscopy    . Carpal tunnel release Left ~ 2004  . Coronary angioplasty with stent placement  12/04/2013    "2 stents"  . Eye examination under anesthesia w/ retinal cryotherapy and retinal laser Bilateral 2005-2010    "for my diabetes" (12/04/2013)  . Left heart catheterization with coronary angiogram Bilateral 12/04/2013    Procedure: LEFT HEART CATHETERIZATION WITH CORONARY ANGIOGRAM;  Surgeon: Candee Furbish, MD;  Location: [facility] CATH LAB;  Service: Cardiovascular;  Laterality: Bilateral;    Social History   Social History  . Marital Status: Married    Spouse Name: N/A  .  Number of Children: N/A  . Years of Education: N/A   Occupational History  . sales    Social History Main Topics  . Smoking status: Former Smoker -- 1.50 packs/day for 45 years    Types: Cigarettes    Quit date: 10/02/2010  . Smokeless tobacco: Never Used  . Alcohol Use: 0.6 oz/week    1 Shots of liquor per week  . Drug Use: No  . Sexual Activity: Yes   Other Topics Concern  . Not on file   Social History Narrative   Regular exercise-no   Pt went to seminar to quit smoking    Current Outpatient Prescriptions on File Prior to Visit  Medication Sig Dispense Refill  . allopurinol (ZYLOPRIM) 300 MG tablet Take 1 tablet (300 mg total) by mouth daily. 100 tablet 3  . amLODipine (NORVASC) 10 MG tablet TAKE 1 TABLET BY MOUTH EVERY DAY 100 tablet 2  . ANDROGEL PUMP 20.25 MG/ACT (1.62%) GEL PUMP 5 TIMES ONTO THE SKIN EVERY DAY 75 g 2  . aspirin 81 MG chewable tablet Chew 1 tablet (81 mg total) by mouth daily.    . B-D UF III MINI PEN NEEDLES 31G X 5 MM MISC INJECT 1 PEN AS DIRECTED 3 (THREE) TIMES DAILY. USE AS DIRECTED 3X DAILY 100 each 2  . doxazosin (CARDURA) 8 MG tablet TAKE 1 TABLET BY MOUTH AT BEDTIME 100 tablet 1  .  fluticasone (FLONASE) 50 MCG/ACT nasal spray PLACE 1 SPRAY INTO BOTH NOSTRILS DAILY. 16 g 3  . glucose blood (ONE TOUCH ULTRA TEST) test strip Use to test blood sugar twice daily. Dx: E10.22 100 each 2  . indomethacin (INDOCIN SR) 75 MG CR capsule TAKE 1 CAPSULE (75 MG TOTAL) BY MOUTH 2 (TWO) TIMES DAILY. 200 capsule 3  . linagliptin (TRADJENTA) 5 MG TABS tablet Take 1 tablet (5 mg total) by mouth daily. 30 tablet 11  . losartan (COZAAR) 100 MG tablet TAKE 1 TABLET TWICE A DAY 200 tablet 1  . ONE TOUCH ULTRA TEST test strip USE TO TEST TWICE A DAY 100 each 3  . simvastatin (ZOCOR) 40 MG tablet Take 1 tablet (40 mg total) by mouth at bedtime. 100 tablet 2   No current facility-administered medications on file prior to visit.    No Known Allergies  Family History    Problem Relation Age of Onset  . Cancer Neg Hx     No family history of colon cancer  . Colon cancer Neg Hx   . Esophageal cancer Neg Hx   . Stomach cancer Neg Hx   . Rectal cancer Neg Hx   . Diabetes Other   . Hyperlipidemia Other   . Hypertension Other   . Heart disease Father   . Heart attack Father   . Stroke Neg Hx     BP 132/67 mmHg  Pulse 62  Temp(Src) 97.9 F (36.6 C) (Oral)  Ht 5\' 10"  (1.778 m)  Wt 267 lb (121.11 kg)  BMI 38.31 kg/m2  SpO2 97%  Review of Systems He denies hypoglycemia.    Objective:   Physical Exam VITAL SIGNS: See vs page GENERAL: no distress.  Pulses: dorsalis pedis intact bilat.  MSK: no deformity of the feet.  CV: trace right leg edema.  Skin: no ulcer on the feet. normal color and temp on the feet. Old healed surgical scar at the right medial malleolus.  Neuro: sensation is intact to touch on the feet.   Lab Results  Component Value Date   HGBA1C 9.3* 11/02/2015      Assessment & Plan:  DM: he needs increased rx.  He may need to resume insulin  Patient is advised the following: Patient Instructions  Please come back for a follow-up appointment in 3 months.   check your blood sugar twice a day.  vary the time of day when you check, between before the 3 meals, and at bedtime.  also check if you have symptoms of your blood sugar being too high or too low.  please keep a record of the readings and bring it to your next appointment here.  You can write it on any piece of paper.  please call us sooner if your blood sugar goes below 70, or if you have a lot of readings over 200.  i have sent a prescription to your pharmacy, to add "invokana."   i have sent prescriptions to your pharmacy, to double the bromocriptine and repaglinide.  Please come back for blood tests in 2 weeks

## 2015-11-04 NOTE — Patient Instructions (Addendum)
Please come back for a follow-up appointment in 3 months.   check your blood sugar twice a day.  vary the time of day when you check, between before the 3 meals, and at bedtime.  also check if you have symptoms of your blood sugar being too high or too low.  please keep a record of the readings and bring it to your next appointment here.  You can write it on any piece of paper.  please call us sooner if your blood sugar goes below 70, or if you have a lot of readings over 200.  i have sent a prescription to your pharmacy, to add "invokana."   i have sent prescriptions to your pharmacy, to double the bromocriptine and repaglinide.  Please come back for blood tests in 2 weeks

## 2015-11-09 ENCOUNTER — Encounter: Payer: Managed Care, Other (non HMO) | Admitting: Family Medicine

## 2015-11-09 ENCOUNTER — Ambulatory Visit (INDEPENDENT_AMBULATORY_CARE_PROVIDER_SITE_OTHER): Payer: Managed Care, Other (non HMO) | Admitting: Family Medicine

## 2015-11-09 DIAGNOSIS — E785 Hyperlipidemia, unspecified: Secondary | ICD-10-CM

## 2015-11-09 DIAGNOSIS — G4730 Sleep apnea, unspecified: Secondary | ICD-10-CM

## 2015-11-09 DIAGNOSIS — N183 Chronic kidney disease, stage 3 (moderate): Secondary | ICD-10-CM

## 2015-11-09 DIAGNOSIS — Z0000 Encounter for general adult medical examination without abnormal findings: Secondary | ICD-10-CM | POA: Diagnosis not present

## 2015-11-09 DIAGNOSIS — M109 Gout, unspecified: Secondary | ICD-10-CM

## 2015-11-09 DIAGNOSIS — I10 Essential (primary) hypertension: Secondary | ICD-10-CM

## 2015-11-09 DIAGNOSIS — E1022 Type 1 diabetes mellitus with diabetic chronic kidney disease: Secondary | ICD-10-CM

## 2015-11-09 DIAGNOSIS — N259 Disorder resulting from impaired renal tubular function, unspecified: Secondary | ICD-10-CM

## 2015-11-09 DIAGNOSIS — M1A1790 Lead-induced chronic gout, unspecified ankle and foot, without tophus (tophi): Secondary | ICD-10-CM

## 2015-11-09 DIAGNOSIS — T560X1D Toxic effect of lead and its compounds, accidental (unintentional), subsequent encounter: Secondary | ICD-10-CM

## 2015-11-09 MED ORDER — INDOMETHACIN ER 75 MG PO CPCR: ORAL_CAPSULE | ORAL | Status: DC

## 2015-11-09 MED ORDER — AMLODIPINE BESYLATE 10 MG PO TABS: 10.0000 mg | ORAL_TABLET | Freq: Every day | ORAL | Status: DC

## 2015-11-09 MED ORDER — FLUTICASONE PROPIONATE 50 MCG/ACT NA SUSP: NASAL | Status: DC

## 2015-11-09 MED ORDER — LOSARTAN POTASSIUM 100 MG PO TABS: 100.0000 mg | ORAL_TABLET | Freq: Two times a day (BID) | ORAL | Status: DC

## 2015-11-09 MED ORDER — DOXAZOSIN MESYLATE 8 MG PO TABS: 8.0000 mg | ORAL_TABLET | Freq: Every day | ORAL | Status: DC

## 2015-11-09 MED ORDER — SIMVASTATIN 40 MG PO TABS: 40.0000 mg | ORAL_TABLET | Freq: Every day | ORAL | Status: DC

## 2015-11-09 MED ORDER — ALLOPURINOL 300 MG PO TABS: 300.0000 mg | ORAL_TABLET | Freq: Every day | ORAL | Status: DC

## 2015-11-09 NOTE — Progress Notes (Signed)
Pre visit review using our clinic review tool, if applicable. No additional management support is needed unless otherwise documented below in the visit note. 

## 2015-11-09 NOTE — Patient Instructions (Signed)
Continue current medications for your hypertension, hyperlipidemia, and gout  Follow-up with Dr. Loanne Drilling for your  diabetes  Call in August 2017 for your general physical December 2017.......... when you call set up your appointment with Tommi Rumps or Almyra Free are 2 new nurse practitioners or Dr. Martinique

## 2015-11-09 NOTE — Progress Notes (Signed)
Subjective:    Patient ID: Bryan Haley, male    DOB: Nov 29, 1950, 64 y.o.   MRN: [id number]  HPI Bryan Haley is a 64 year old married male nonsmoker who comes in today for general physical examination  He has the classic metabolic syndrome........ obesity weight up to 269 pounds, hypertension on medication BP 120/80, hyperlipidemia on Zocor, diabetes out-of-control. He's currently been followed by Dr. Loanne Drilling in endocrinology. They changed his medication last week because his A1c is 9.3% and blood sugars over 300. He's not exercising on a daily basis  He also has history gout which he takes allopurinol 300 mg daily prophylactically.  He also has osteoarthritis takes indomethacin 75 mg daily. Med list was reviewed its correct  Vaccinations up-to-date he's been seen and followed by ophthalmology. He sees his retinal specialist Dr. Baird Cancer every 6 months because of diabetic retinopathy.  He gets routine eye care no dental care, colonoscopy 2014 normal he also has renal insufficiency because of long-term metabolic syndrome currently creatinine is 1.5 EOM 18 GFR is 49. His proteinuria is stable at 3+ cholesterol markedly abnormal with triglycerides 615 because his blood sugars elevated. Microalbumin is 95 again A1c 9.3%  He again is not following a diet and he is not exercising. Encouraged to have a living will and healthcare power of attorney   Review of Systems  Constitutional: Negative.   HENT: Negative.   Eyes: Negative.   Respiratory: Negative.   Cardiovascular: Negative.   Gastrointestinal: Negative.   Endocrine: Negative.   Genitourinary: Negative.   Musculoskeletal: Negative.   Skin: Negative.   Allergic/Immunologic: Negative.   Neurological: Negative.   Hematological: Negative.   Psychiatric/Behavioral: Negative.        Objective:   Physical Exam  Constitutional: He is oriented to person, place, and time. He appears well-developed and well-nourished.  HENT:  Head:  Normocephalic and atraumatic.  Right Ear: External ear normal.  Left Ear: External ear normal.  Nose: Nose normal.  Mouth/Throat: Oropharynx is clear and moist.  Eyes: Conjunctivae and EOM are normal. Pupils are equal, round, and reactive to light.  Neck: Normal range of motion. Neck supple. No JVD present. No tracheal deviation present. No thyromegaly present.  Cardiovascular: Normal rate, regular rhythm, normal heart sounds and intact distal pulses.  Exam reveals no gallop and no friction rub.   No murmur heard. No carotid nor aortic bruits peripheral pulses 1+ and symmetrical  Pulmonary/Chest: Effort normal and breath sounds normal. No stridor. No respiratory distress. He has no wheezes. He has no rales. He exhibits no tenderness.  Abdominal: Soft. Bowel sounds are normal. He exhibits no distension and no mass. There is no tenderness. There is no rebound and no guarding.  Large panniculus  Genitourinary: Rectum normal and penis normal. Guaiac negative stool. No penile tenderness.  2+ symmetrical nonnodular BPH nocturia 2 probably mainly because of the hyperglycemia  Musculoskeletal: Normal range of motion. He exhibits no edema or tenderness.  Lymphadenopathy:    He has no cervical adenopathy.  Neurological: He is alert and oriented to person, place, and time. He has normal reflexes. No cranial nerve deficit. He exhibits normal muscle tone.  Skin: Skin is warm and dry. No rash noted. No erythema. No pallor.  Psychiatric: He has a normal mood and affect. His behavior is normal. Judgment and thought content normal.  Nursing note and vitals reviewed.         Assessment & Plan:  Hypertension at goal.......... continue current therapy  Diabetes type 1  not at goal........... followed by Dr. Loanne Drilling  Hyperlipidemia............. continue Zocor and aspirin  Obesity........... again encouraged diet exercise and weight loss  Renal insufficiency............ stable  History of  gout............ 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 Tye Savoy  Sleep apnea......... CPAP and followed in pulmonary

## 2015-11-18 ENCOUNTER — Encounter: Payer: Self-pay | Admitting: Endocrinology

## 2015-11-18 ENCOUNTER — Ambulatory Visit (INDEPENDENT_AMBULATORY_CARE_PROVIDER_SITE_OTHER): Payer: Managed Care, Other (non HMO) | Admitting: Endocrinology

## 2015-11-18 VITALS — BP 136/78 | HR 59 | Temp 98.2°F | Ht 70.0 in | Wt 267.0 lb

## 2015-11-18 DIAGNOSIS — E1122 Type 2 diabetes mellitus with diabetic chronic kidney disease: Secondary | ICD-10-CM | POA: Diagnosis not present

## 2015-11-18 DIAGNOSIS — N259 Disorder resulting from impaired renal tubular function, unspecified: Secondary | ICD-10-CM

## 2015-11-18 DIAGNOSIS — N182 Chronic kidney disease, stage 2 (mild): Secondary | ICD-10-CM | POA: Diagnosis not present

## 2015-11-18 LAB — BASIC METABOLIC PANEL
BUN: 22 mg/dL (ref 6–23)
CALCIUM: 9.4 mg/dL (ref 8.4–10.5)
CHLORIDE: 102 meq/L (ref 96–112)
CO2: 29 meq/L (ref 19–32)
CREATININE: 1.55 mg/dL — AB (ref 0.40–1.50)
GFR: 47.95 mL/min — ABNORMAL LOW (ref >60.00)
GLUCOSE: 262 mg/dL — AB (ref 70–99)
Potassium: 4 mEq/L (ref 3.5–5.1)
Sodium: 140 mEq/L (ref 135–145)

## 2015-11-18 NOTE — Patient Instructions (Addendum)
Please come back for a follow-up appointment in 3 months.    check your blood sugar twice a day.  vary the time of day when you check, between before the 3 meals, and at bedtime.  also check if you have symptoms of your blood sugar being too high or too low.  please keep a record of the readings and bring it to your next appointment here.  You can write it on any piece of paper.  please call us sooner if your blood sugar goes below 70, or if you have a lot of readings over 200.  blood tests are requested for you today.  We'll let you know about the results.   Other non-insulin options are welchol, acarbose, and trulicity.

## 2015-11-18 NOTE — Progress Notes (Signed)
Subjective:    Patient ID: Bryan Haley, male    DOB: 03-27-1951, 64 y.o.   MRN: [id number]  HPI Pt returns for f/u of diabetes mellitus: DM type: 2 Dx'ed: 99991111 Complications: renal insufficiency and CAD. Therapy: 4 oral meds.   DKA: never. Severe hypoglycemia: never. Pancreatitis: never.  Other: insurance declined weight-loss surgery; he took insulin 2009-2016; oral rx options are limited by renal insuff and edema.   Interval history:  no cbg record, but states cbg's are in the mid to high-100's.  pt states he feels well in general. Past Medical History  Diagnosis Date  . HYPOGONADISM 06/11/2010  . GOUT 07/11/2007  . HYPERTENSION 07/06/2007  . COLONIC POLYPS, HX OF 10/02/2008  . RENAL INSUFFICIENCY 12/11/2008  . Hyperlipidemia   . Other and unspecified angina pectoris   . Coronary artery disease   . Exertional shortness of breath     "last 2-3 yrs" (12/04/2013)  . OSA (obstructive sleep apnea) 10/08/2009    wears CPAP "more than 1/2 the time"  . Diabetic retinopathy of both eyes ([place])     Laser surgery, Dr. Zadie Rhine  . DM W/COMPLICATION NOS, TYPE I 08/09/2007    "dx'd ~ 1994; I say it's type II" (12/04/2013)  . Coronary atherosclerosis of native coronary artery 12/05/2013    Prox RCA, and PLA DES 12/04/13.     Past Surgical History  Procedure Laterality Date  . Colonoscopy    . Carpal tunnel release Left ~ 2004  . Coronary angioplasty with stent placement  12/04/2013    "2 stents"  . Eye examination under anesthesia w/ retinal cryotherapy and retinal laser Bilateral 2005-2010    "for my diabetes" (12/04/2013)  . Left heart catheterization with coronary angiogram Bilateral 12/04/2013    Procedure: LEFT HEART CATHETERIZATION WITH CORONARY ANGIOGRAM;  Surgeon: Candee Furbish, MD;  Location: [facility] CATH LAB;  Service: Cardiovascular;  Laterality: Bilateral;    Social History   Social History  . Marital Status: Married    Spouse Name: N/A  . Number of Children: N/A  . Years of Education:  N/A   Occupational History  . sales    Social History Main Topics  . Smoking status: Former Smoker -- 1.50 packs/day for 45 years    Types: Cigarettes    Quit date: 10/02/2010  . Smokeless tobacco: Never Used  . Alcohol Use: 0.6 oz/week    1 Shots of liquor per week  . Drug Use: No  . Sexual Activity: Yes   Other Topics Concern  . Not on file   Social History Narrative   Regular exercise-no   Pt went to seminar to quit smoking    Current Outpatient Prescriptions on File Prior to Visit  Medication Sig Dispense Refill  . allopurinol (ZYLOPRIM) 300 MG tablet Take 1 tablet (300 mg total) by mouth daily. 100 tablet 3  . amLODipine (NORVASC) 10 MG tablet Take 1 tablet (10 mg total) by mouth daily. 100 tablet 3  . ANDROGEL PUMP 20.25 MG/ACT (1.62%) GEL PUMP 5 TIMES ONTO THE SKIN EVERY DAY 75 g 2  . aspirin 81 MG chewable tablet Chew 1 tablet (81 mg total) by mouth daily.    . B-D UF III MINI PEN NEEDLES 31G X 5 MM MISC INJECT 1 PEN AS DIRECTED 3 (THREE) TIMES DAILY. USE AS DIRECTED 3X DAILY 100 each 2  . bromocriptine (PARLODEL) 5 MG capsule Take 1 capsule (5 mg total) by mouth daily. 30 capsule 11  . canagliflozin (INVOKANA)  100 MG TABS tablet Take 1 tablet (100 mg total) by mouth daily before breakfast. 30 tablet 11  . doxazosin (CARDURA) 8 MG tablet Take 1 tablet (8 mg total) by mouth at bedtime. 100 tablet 3  . fluticasone (FLONASE) 50 MCG/ACT nasal spray PLACE 1 SPRAY INTO BOTH NOSTRILS DAILY. 16 g 10  . glucose blood (ONE TOUCH ULTRA TEST) test strip Use to test blood sugar twice daily. Dx: E10.22 100 each 2  . indomethacin (INDOCIN SR) 75 MG CR capsule TAKE 1 CAPSULE (75 MG TOTAL) BY MOUTH  DAILY. 100 capsule 3  . linagliptin (TRADJENTA) 5 MG TABS tablet Take 1 tablet (5 mg total) by mouth daily. 30 tablet 11  . losartan (COZAAR) 100 MG tablet Take 1 tablet (100 mg total) by mouth 2 (two) times daily. 200 tablet 3  . ONE TOUCH ULTRA TEST test strip USE TO TEST TWICE A DAY 100  each 3  . repaglinide (PRANDIN) 2 MG tablet Take 2 tablets (4 mg total) by mouth 3 (three) times daily before meals. 180 tablet 11  . simvastatin (ZOCOR) 40 MG tablet Take 1 tablet (40 mg total) by mouth at bedtime. 100 tablet 3   No current facility-administered medications on file prior to visit.    No Known Allergies  Family History  Problem Relation Age of Onset  . Cancer Neg Hx     No family history of colon cancer  . Colon cancer Neg Hx   . Esophageal cancer Neg Hx   . Stomach cancer Neg Hx   . Rectal cancer Neg Hx   . Diabetes Other   . Hyperlipidemia Other   . Hypertension Other   . Heart disease Father   . Heart attack Father   . Stroke Neg Hx     BP 136/78 mmHg  Pulse 59  Temp(Src) 98.2 F (36.8 C) (Oral)  Ht 5\' 10"  (1.778 m)  Wt 267 lb (121.11 kg)  BMI 38.31 kg/m2  SpO2 92%  Review of Systems No weight change    Objective:   Physical Exam VITAL SIGNS:  See vs page GENERAL: no distress Ext: 1+ bilat leg edema   Lab Results  Component Value Date   CREATININE 1.55* 11/18/2015   BUN 22 11/18/2015   NA 140 11/18/2015   K 4.0 11/18/2015   CL 102 11/18/2015   CO2 29 11/18/2015       Assessment & Plan:  Renal insufficiency: stable.  DM: he prob need additional oral meds if he is to avoid the need to resume insulin.  We'll check fructosamine.   Patient is advised the following: Patient Instructions  Please come back for a follow-up appointment in 3 months.    check your blood sugar twice a day.  vary the time of day when you check, between before the 3 meals, and at bedtime.  also check if you have symptoms of your blood sugar being too high or too low.  please keep a record of the readings and bring it to your next appointment here.  You can write it on any piece of paper.  please call us sooner if your blood sugar goes below 70, or if you have a lot of readings over 200.  blood tests are requested for you today.  We'll let you know about the  results.   Other non-insulin options are welchol, acarbose, and trulicity.

## 2015-11-20 LAB — FRUCTOSAMINE: FRUCTOSAMINE: 291 umol/L — AB (ref 190–270)

## 2015-12-16 ENCOUNTER — Encounter: Payer: Self-pay | Admitting: Endocrinology

## 2016-01-13 ENCOUNTER — Encounter: Payer: Self-pay | Admitting: Family Medicine

## 2016-01-14 ENCOUNTER — Other Ambulatory Visit: Payer: Self-pay | Admitting: Endocrinology

## 2016-01-14 MED ORDER — VARENICLINE TARTRATE 1 MG PO TABS: 1.0000 mg | ORAL_TABLET | Freq: Two times a day (BID) | ORAL | Status: DC

## 2016-02-16 ENCOUNTER — Ambulatory Visit (INDEPENDENT_AMBULATORY_CARE_PROVIDER_SITE_OTHER): Payer: Managed Care, Other (non HMO) | Admitting: Endocrinology

## 2016-02-16 ENCOUNTER — Encounter: Payer: Self-pay | Admitting: Endocrinology

## 2016-02-16 VITALS — BP 128/70 | HR 70 | Temp 98.3°F | Ht 70.0 in | Wt 247.0 lb

## 2016-02-16 DIAGNOSIS — E1122 Type 2 diabetes mellitus with diabetic chronic kidney disease: Secondary | ICD-10-CM | POA: Diagnosis not present

## 2016-02-16 DIAGNOSIS — N182 Chronic kidney disease, stage 2 (mild): Secondary | ICD-10-CM | POA: Diagnosis not present

## 2016-02-16 LAB — POCT GLYCOSYLATED HEMOGLOBIN (HGB A1C): HEMOGLOBIN A1C: 6.8

## 2016-02-16 NOTE — Progress Notes (Signed)
Subjective:    Patient ID: Bryan Haley, male    DOB: 1951/08/21, 65 y.o.   MRN: [id number]  HPI Pt returns for f/u of diabetes mellitus: DM type: 2 Dx'ed: 99991111 Complications: renal insufficiency and CAD. Therapy: 4 oral meds.   DKA: never.  Severe hypoglycemia: never.  Pancreatitis: never.   Other: insurance declined weight-loss surgery; he took insulin 2009-2016; oral rx options are limited by renal insuff and edema.   Interval history:  no cbg record, but states cbg's are in the mid to high-100's.  pt states he feels well in general.   Past Medical History  Diagnosis Date  . HYPOGONADISM 06/11/2010  . GOUT 07/11/2007  . HYPERTENSION 07/06/2007  . COLONIC POLYPS, HX OF 10/02/2008  . RENAL INSUFFICIENCY 12/11/2008  . Hyperlipidemia   . Other and unspecified angina pectoris   . Coronary artery disease   . Exertional shortness of breath     "last 2-3 yrs" (12/04/2013)  . OSA (obstructive sleep apnea) 10/08/2009    wears CPAP "more than 1/2 the time"  . Diabetic retinopathy of both eyes ([place])     Laser surgery, Dr. Zadie Rhine  . DM W/COMPLICATION NOS, TYPE I 08/09/2007    "dx'd ~ 1994; I say it's type II" (12/04/2013)  . Coronary atherosclerosis of native coronary artery 12/05/2013    Prox RCA, and PLA DES 12/04/13.     Past Surgical History  Procedure Laterality Date  . Colonoscopy    . Carpal tunnel release Left ~ 2004  . Coronary angioplasty with stent placement  12/04/2013    "2 stents"  . Eye examination under anesthesia w/ retinal cryotherapy and retinal laser Bilateral 2005-2010    "for my diabetes" (12/04/2013)  . Left heart catheterization with coronary angiogram Bilateral 12/04/2013    Procedure: LEFT HEART CATHETERIZATION WITH CORONARY ANGIOGRAM;  Surgeon: Candee Furbish, MD;  Location: [facility] CATH LAB;  Service: Cardiovascular;  Laterality: Bilateral;    Social History   Social History  . Marital Status: Married    Spouse Name: N/A  . Number of Children: N/A  . Years of  Education: N/A   Occupational History  . sales    Social History Main Topics  . Smoking status: Former Smoker -- 1.50 packs/day for 45 years    Types: Cigarettes    Quit date: 10/02/2010  . Smokeless tobacco: Never Used  . Alcohol Use: 0.6 oz/week    1 Shots of liquor per week  . Drug Use: No  . Sexual Activity: Yes   Other Topics Concern  . Not on file   Social History Narrative   Regular exercise-no   Pt went to seminar to quit smoking    Current Outpatient Prescriptions on File Prior to Visit  Medication Sig Dispense Refill  . allopurinol (ZYLOPRIM) 300 MG tablet Take 1 tablet (300 mg total) by mouth daily. 100 tablet 3  . amLODipine (NORVASC) 10 MG tablet Take 1 tablet (10 mg total) by mouth daily. 100 tablet 3  . ANDROGEL PUMP 20.25 MG/ACT (1.62%) GEL APPLY 5 PUMPS ONTO THE SKIN EVERY DAY 75 g 2  . aspirin 81 MG chewable tablet Chew 1 tablet (81 mg total) by mouth daily.    . bromocriptine (PARLODEL) 5 MG capsule Take 1 capsule (5 mg total) by mouth daily. 30 capsule 11  . canagliflozin (INVOKANA) 100 MG TABS tablet Take 1 tablet (100 mg total) by mouth daily before breakfast. 30 tablet 11  . doxazosin (CARDURA) 8 MG tablet  Take 1 tablet (8 mg total) by mouth at bedtime. 100 tablet 3  . fluticasone (FLONASE) 50 MCG/ACT nasal spray PLACE 1 SPRAY INTO BOTH NOSTRILS DAILY. 16 g 10  . glucose blood (ONE TOUCH ULTRA TEST) test strip Use to test blood sugar twice daily. Dx: E10.22 100 each 2  . indomethacin (INDOCIN SR) 75 MG CR capsule TAKE 1 CAPSULE (75 MG TOTAL) BY MOUTH  DAILY. 100 capsule 3  . linagliptin (TRADJENTA) 5 MG TABS tablet Take 1 tablet (5 mg total) by mouth daily. 30 tablet 11  . losartan (COZAAR) 100 MG tablet Take 1 tablet (100 mg total) by mouth 2 (two) times daily. 200 tablet 3  . ONE TOUCH ULTRA TEST test strip USE TO TEST TWICE A DAY 100 each 3  . repaglinide (PRANDIN) 2 MG tablet Take 2 tablets (4 mg total) by mouth 3 (three) times daily before meals. 180  tablet 11  . simvastatin (ZOCOR) 40 MG tablet Take 1 tablet (40 mg total) by mouth at bedtime. 100 tablet 3  . varenicline (CHANTIX CONTINUING MONTH PAK) 1 MG tablet Take 1 tablet (1 mg total) by mouth 2 (two) times daily. 60 tablet 5   No current facility-administered medications on file prior to visit.    No Known Allergies  Family History  Problem Relation Age of Onset  . Cancer Neg Hx     No family history of colon cancer  . Colon cancer Neg Hx   . Esophageal cancer Neg Hx   . Stomach cancer Neg Hx   . Rectal cancer Neg Hx   . Diabetes Other   . Hyperlipidemia Other   . Hypertension Other   . Heart disease Father   . Heart attack Father   . Stroke Neg Hx     BP 128/70 mmHg  Pulse 70  Temp(Src) 98.3 F (36.8 C) (Oral)  Ht 5\' 10"  (1.778 m)  Wt 247 lb (112.038 kg)  BMI 35.44 kg/m2  SpO2 96%  Review of Systems Edema is unchanged    Objective:   Physical Exam VITAL SIGNS: See vs page GENERAL: no distress.  Pulses: dorsalis pedis intact bilat.  MSK: no deformity of the feet.  CV: trace right leg edema.  Skin: no ulcer on the feet. normal temp on the feet. Old healed surgical scar at the right medial malleolus.  Spotty hyperpigmentation on the feet.   Neuro: sensation is intact to touch on the feet.   A1c=6.8%    Assessment & Plan:  DM: well-controlled.  Please continue the same medications  Patient is advised the following: Patient Instructions  Please come back for a follow-up appointment in 4 months.    check your blood sugar twice a day.  vary the time of day when you check, between before the 3 meals, and at bedtime.  also check if you have symptoms of your blood sugar being too high or too low.  please keep a record of the readings and bring it to your next appointment here.  You can write it on any piece of paper.  please call us sooner if your blood sugar goes below 70, or if you have a lot of readings over 200.  Other non-insulin options are  welchol, acarbose, and trulicity.

## 2016-02-16 NOTE — Patient Instructions (Signed)
Please come back for a follow-up appointment in 4 months.    check your blood sugar twice a day.  vary the time of day when you check, between before the 3 meals, and at bedtime.  also check if you have symptoms of your blood sugar being too high or too low.  please keep a record of the readings and bring it to your next appointment here.  You can write it on any piece of paper.  please call us sooner if your blood sugar goes below 70, or if you have a lot of readings over 200.  Other non-insulin options are welchol, acarbose, and trulicity.

## 2016-05-03 ENCOUNTER — Other Ambulatory Visit: Payer: Self-pay | Admitting: Endocrinology

## 2016-05-18 ENCOUNTER — Other Ambulatory Visit: Payer: Self-pay | Admitting: Family Medicine

## 2016-06-15 ENCOUNTER — Ambulatory Visit (INDEPENDENT_AMBULATORY_CARE_PROVIDER_SITE_OTHER): Payer: Managed Care, Other (non HMO) | Admitting: Endocrinology

## 2016-06-15 ENCOUNTER — Telehealth: Payer: Self-pay | Admitting: Endocrinology

## 2016-06-15 ENCOUNTER — Encounter: Payer: Self-pay | Admitting: Endocrinology

## 2016-06-15 VITALS — BP 130/62 | HR 60 | Ht 70.0 in | Wt 255.4 lb

## 2016-06-15 DIAGNOSIS — E138 Other specified diabetes mellitus with unspecified complications: Secondary | ICD-10-CM

## 2016-06-15 LAB — POCT GLYCOSYLATED HEMOGLOBIN (HGB A1C): HEMOGLOBIN A1C: 7.5

## 2016-06-15 MED ORDER — DULAGLUTIDE 1.5 MG/0.5ML ~~LOC~~ SOAJ: 1.5000 mg | Freq: Every day | SUBCUTANEOUS | Status: DC

## 2016-06-15 MED ORDER — DULAGLUTIDE 1.5 MG/0.5ML ~~LOC~~ SOAJ: 1.5000 mg | SUBCUTANEOUS | Status: DC

## 2016-06-15 NOTE — Progress Notes (Signed)
   Subjective:    Patient ID: Bryan Haley, male    DOB: June 02, 1951, 65 y.o.   MRN: [id number]  HPI Pt returns for f/u of diabetes mellitus: DM type: 2. Dx'ed: 2001.  Complications: renal insufficiency and CAD.  Therapy: 4 oral meds.   DKA: never.  Severe hypoglycemia: never.  Pancreatitis: never.   Other: insurance declined weight-loss surgery; he took insulin 2009-2016; oral rx options are limited by renal insuff and edema.   Interval history:  no cbg record, but states cbg's are in the mid to high-100's.  pt states he feels well in general.     Review of Systems He denies hypoglycemia    Objective:   Physical Exam  VITAL SIGNS:  See vs page GENERAL: no distress Pulses: dorsalis pedis intact bilat.   MSK: no deformity of the feet CV: trace bilat leg edema Skin:  no ulcer on the feet.  normal color and temp on the feet. Neuro: sensation is intact to touch on the feet.  Lab Results  Component Value Date   HGBA1C 7.5 06/15/2016   Lab Results  Component Value Date   CREATININE 1.55* 11/18/2015   BUN 22 11/18/2015   NA 140 11/18/2015   K 4.0 11/18/2015   CL 102 11/18/2015   CO2 29 11/18/2015      Assessment & Plan:  Type 2 DM: worse Renal failure: this limits oral DM rx options Edema, persistent: this also limits oral DM rx options  Patient is advised the following: Patient Instructions  Please come back for a follow-up appointment in 3 months.    check your blood sugar twice a day.  vary the time of day when you check, between before the 3 meals, and at bedtime.  also check if you have symptoms of your blood sugar being too high or too low.  please keep a record of the readings and bring it to your next appointment here.  You can write it on any piece of paper.  please call us sooner if your blood sugar goes below 70, or if you have a lot of readings over 200.  Please change the tradjenta to "trulicity."  I have sent a prescription to your pharmacy.    Please  continue the same other medications for diabetes.     Renato Shin, MD

## 2016-06-15 NOTE — Telephone Encounter (Signed)
Rx resubmitted

## 2016-06-15 NOTE — Telephone Encounter (Signed)
[facility] called, they said the Trulicity Rx they received had an incorrect dosage, needing the corrected dosage sent in.

## 2016-06-15 NOTE — Patient Instructions (Addendum)
Please come back for a follow-up appointment in 3 months.    check your blood sugar twice a day.  vary the time of day when you check, between before the 3 meals, and at bedtime.  also check if you have symptoms of your blood sugar being too high or too low.  please keep a record of the readings and bring it to your next appointment here.  You can write it on any piece of paper.  please call us sooner if your blood sugar goes below 70, or if you have a lot of readings over 200.  Please change the tradjenta to "trulicity."  I have sent a prescription to your pharmacy.    Please continue the same other medications for diabetes.

## 2016-06-17 ENCOUNTER — Ambulatory Visit: Payer: Managed Care, Other (non HMO) | Admitting: Endocrinology

## 2016-06-17 ENCOUNTER — Other Ambulatory Visit: Payer: Self-pay | Admitting: Family Medicine

## 2016-06-17 MED ORDER — INDOMETHACIN ER 75 MG PO CPCR: ORAL_CAPSULE | ORAL | Status: DC

## 2016-07-04 ENCOUNTER — Other Ambulatory Visit: Payer: Self-pay | Admitting: Endocrinology

## 2016-07-26 ENCOUNTER — Encounter: Payer: Self-pay | Admitting: Endocrinology

## 2016-08-16 LAB — HM DIABETES EYE EXAM

## 2016-08-22 ENCOUNTER — Other Ambulatory Visit: Payer: Self-pay | Admitting: Endocrinology

## 2016-08-26 ENCOUNTER — Other Ambulatory Visit: Payer: Self-pay | Admitting: Family Medicine

## 2016-09-03 ENCOUNTER — Ambulatory Visit (HOSPITAL_COMMUNITY)
Admission: EM | Admit: 2016-09-03 | Discharge: 2016-09-03 | Disposition: A | Discharge status: Home / Self Care | Payer: Managed Care, Other (non HMO) | Attending: Internal Medicine | Admitting: Internal Medicine

## 2016-09-03 ENCOUNTER — Encounter (HOSPITAL_COMMUNITY): Payer: Self-pay | Admitting: Emergency Medicine

## 2016-09-03 DIAGNOSIS — Z87891 Personal history of nicotine dependence: Secondary | ICD-10-CM | POA: Diagnosis not present

## 2016-09-03 DIAGNOSIS — Z79899 Other long term (current) drug therapy: Secondary | ICD-10-CM | POA: Insufficient documentation

## 2016-09-03 DIAGNOSIS — Z7982 Long term (current) use of aspirin: Secondary | ICD-10-CM | POA: Insufficient documentation

## 2016-09-03 DIAGNOSIS — J069 Acute upper respiratory infection, unspecified: Secondary | ICD-10-CM | POA: Insufficient documentation

## 2016-09-03 DIAGNOSIS — J029 Acute pharyngitis, unspecified: Secondary | ICD-10-CM

## 2016-09-03 DIAGNOSIS — R0982 Postnasal drip: Secondary | ICD-10-CM | POA: Insufficient documentation

## 2016-09-03 LAB — POCT RAPID STREP A: Streptococcus, Group A Screen (Direct): NEGATIVE

## 2016-09-03 NOTE — ED Triage Notes (Signed)
Patient reports 2 day history of cough, chest congestion, and sore throat.  Denies ear pain, denies fever.

## 2016-09-03 NOTE — ED Provider Notes (Signed)
CSN: [id number]     Arrival date & time 09/03/16  1248 History   First MD Initiated Contact with Patient 09/03/16 1543     Chief Complaint  Patient presents with  . URI   (Consider location/radiation/quality/duration/timing/severity/associated sxs/prior Treatment) 65 year old male complaining of sore throat and cough associated with feeling bad and weak. Denies PND, shortness of breath, chest pain, fevers, runny nose or earache. Symptoms started approximately 2 days ago. Symptoms worse today.      Past Medical History:  Diagnosis Date  . COLONIC POLYPS, HX OF 10/02/2008  . Coronary artery disease   . Coronary atherosclerosis of native coronary artery 12/05/2013   Prox RCA, and PLA DES 12/04/13.   . Diabetic retinopathy of both eyes ([place])    Laser surgery, Dr. Zadie Rhine  . 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 Exertional shortness of breath    "last 2-3 yrs" (12/04/2013)  . GOUT 07/11/2007  . Hyperlipidemia   . HYPERTENSION 07/06/2007  . HYPOGONADISM 06/11/2010  . OSA (obstructive sleep apnea) 10/08/2009   wears CPAP "more than 1/2 the time"  . Other and unspecified angina pectoris   . RENAL INSUFFICIENCY 12/11/2008   Past Surgical History:  Procedure Laterality Date  . CARPAL TUNNEL RELEASE Left ~ 2004  . COLONOSCOPY    . CORONARY ANGIOPLASTY WITH STENT PLACEMENT  12/04/2013   "2 stents"  . EYE EXAMINATION UNDER ANESTHESIA W/ RETINAL CRYOTHERAPY AND RETINAL LASER Bilateral 2005-2010   "for my diabetes" (12/04/2013)  . LEFT HEART CATHETERIZATION WITH CORONARY ANGIOGRAM Bilateral 12/04/2013   Procedure: LEFT HEART CATHETERIZATION WITH CORONARY ANGIOGRAM;  Surgeon: Candee Furbish, MD;  Location: [facility] CATH LAB;  Service: Cardiovascular;  Laterality: Bilateral;   Family History  Problem Relation Age of Onset  . Heart disease Father   . Heart attack Father   . Diabetes Other   . Hyperlipidemia Other   . Hypertension Other   . Cancer Neg Hx    No family history of colon cancer  . Colon cancer Neg Hx   . Esophageal cancer Neg Hx   . Stomach cancer Neg Hx   . Rectal cancer Neg Hx   . Stroke Neg Hx    Social History  Substance Use Topics  . Smoking status: Former Smoker    Packs/day: 1.50    Years: 45.00    Types: Cigarettes    Quit date: 10/02/2010  . Smokeless tobacco: Never Used  . Alcohol use 0.6 oz/week    1 Shots of liquor per week    Review of Systems  Constitutional: Positive for activity change and fever. Negative for diaphoresis and fatigue.  HENT: Positive for sore throat and voice change. Negative for congestion, ear pain, facial swelling, postnasal drip, rhinorrhea and trouble swallowing.   Eyes: Negative for pain, discharge and redness.  Respiratory: Positive for cough. Negative for chest tightness and shortness of breath.   Cardiovascular: Negative.   Gastrointestinal: Negative.   Musculoskeletal: Negative.  Negative for neck pain and neck stiffness.  Neurological: Negative.     Allergies  Review of patient's allergies indicates no known allergies.  Home Medications   Prior to Admission medications   Medication Sig Start Date End Date Taking? Authorizing Provider  allopurinol (ZYLOPRIM) 300 MG tablet Take 1 tablet (300 mg total) by mouth daily. 11/09/15   Dorena Cookey, MD  amLODipine (NORVASC) 10 MG tablet Take 1 tablet (10 mg total) by mouth daily.  11/09/15   Dorena Cookey, MD  ANDROGEL PUMP 20.25 MG/ACT (1.62%) GEL APPLY 5 PUMPS ONTO THE SKIN EVERY DAY 08/23/16   Renato Shin, MD  aspirin 81 MG chewable tablet Chew 1 tablet (81 mg total) by mouth daily. 12/05/13   Brittainy Erie Noe, PA-C  bromocriptine (PARLODEL) 5 MG capsule Take 1 capsule (5 mg total) by mouth daily. 11/04/15   Renato Shin, MD  canagliflozin (INVOKANA) 100 MG TABS tablet Take 1 tablet (100 mg total) by mouth daily before breakfast. 11/04/15   Renato Shin, MD  doxazosin (CARDURA) 8 MG tablet Take 1 tablet (8 mg total) by mouth at  bedtime. 11/09/15   Dorena Cookey, MD  Dulaglutide (TRULICITY) 1.5 0000000 SOPN Inject 1.5 mg into the skin once a week. 06/15/16   Renato Shin, MD  fluticasone (FLONASE) 50 MCG/ACT nasal spray PLACE 1 SPRAY INTO BOTH NOSTRILS DAILY. 11/09/15   Dorena Cookey, MD  glucose blood (ONE TOUCH ULTRA TEST) test strip Use to test blood sugar twice daily. Dx: E10.22 05/07/15   Renato Shin, MD  indomethacin (INDOCIN SR) 75 MG CR capsule TAKE 1 CAPSULE (75 MG TOTAL) BY MOUTH  DAILY. 06/17/16   Dorena Cookey, MD  losartan (COZAAR) 100 MG tablet Take 1 tablet (100 mg total) by mouth 2 (two) times daily. 11/09/15   Dorena Cookey, MD  losartan (COZAAR) 100 MG tablet TAKE 1 TABLET TWICE A DAY 08/29/16   Dorena Cookey, MD  ONE [facility] ULTRA TEST test strip USE TO TEST TWICE A DAY 09/28/15   Dorena Cookey, MD  repaglinide (PRANDIN) 2 MG tablet Take 2 tablets (4 mg total) by mouth 3 (three) times daily before meals. 11/04/15   Renato Shin, MD  simvastatin (ZOCOR) 40 MG tablet Take 1 tablet (40 mg total) by mouth at bedtime. 11/09/15   Dorena Cookey, MD  varenicline (CHANTIX CONTINUING MONTH PAK) 1 MG tablet Take 1 tablet (1 mg total) by mouth 2 (two) times daily. 01/14/16   Dorena Cookey, MD   Meds Ordered and Administered this Visit  Medications - No data to display  BP 133/62 (BP Location: Left Arm)   Pulse (!) 58   Temp 97.8 F (36.6 C) (Oral)   Resp 20   Ht 5\' 10"  (1.778 m)   Wt 249 lb (112.9 kg)   SpO2 98%   BMI 35.73 kg/m  No data found.   Physical Exam  Constitutional: He is oriented to person, place, and time. He appears well-developed and well-nourished. No distress.  HENT:  Right Ear: External ear normal.  Left Ear: External ear normal.  Bilateral TMs are normal. Oropharynx with light erythema without swelling or exudate. Positive for small to moderate amount of frothy clear PND.  Eyes: EOM are normal. Pupils are equal, round, and reactive to light.  Neck: Normal range of motion. Neck  supple.  Cardiovascular: Normal rate, regular rhythm and normal heart sounds.   Pulmonary/Chest: Effort normal and breath sounds normal. No respiratory distress. He has no wheezes. He has no rales.  Musculoskeletal: Normal range of motion. He exhibits no edema.  Neurological: He is alert and oriented to person, place, and time.  Skin: Skin is warm and dry.  Nursing note and vitals reviewed.   Urgent Care Course   Clinical Course    Procedures (including critical care time)  Labs Review Labs Reviewed  POCT RAPID STREP A    Imaging Review No results found.   Visual  Acuity Review  Right Eye Distance:   Left Eye Distance:   Bilateral Distance:    Right Eye Near:   Left Eye Near:    Bilateral Near:         MDM   1. Upper respiratory tract infection, unspecified type   2. PND (post-nasal drip)   3. Sore throat    The sore throat pain is likely due to a virus and some drainage in the back of your throat it is visible on examination. This also can cause a cough. The strep test is negative. Her lungs are clear and there are no signs of a bacterial type of infection. Saline nasal spray used frequently. For drainage may use Allegra, Claritin or Zyrtec. If you need stronger medicine to stop drainage may take Chlor-Trimeton 2-4 mg every 4 hours. This may cause drowsiness. Ibuprofen 400 mg every 6 hours as needed for pain, discomfort or fever. Drink plenty of fluids and stay well-hydrated. Cepacol lozenges for sorethroat pain. If you  become worse and developed high fevers, shortness of breath, worsening cough, chest pain, vomiting sig medical attention promptly.     Janne Napoleon, NP 09/03/16 1622

## 2016-09-03 NOTE — Discharge Instructions (Signed)
The sore throat pain is likely due to a virus and some drainage in the back of your throat it is visible on examination. This also can cause a cough. The strep test is negative. Her lungs are clear and there are no signs of a bacterial type of infection. Saline nasal spray used frequently. For drainage may use Allegra, Claritin or Zyrtec. If you need stronger medicine to stop drainage may take Chlor-Trimeton 2-4 mg every 4 hours. This may cause drowsiness. Ibuprofen 400 mg every 6 hours as needed for pain, discomfort or fever. Drink plenty of fluids and stay well-hydrated. Cepacol lozenges for sorethroat pain. If you  become worse and developed high fevers, shortness of breath, worsening cough, chest pain, vomiting sig medical attention promptly.

## 2016-09-06 LAB — CULTURE, GROUP A STREP (THRC)

## 2016-09-14 ENCOUNTER — Encounter: Payer: Self-pay | Admitting: Endocrinology

## 2016-09-14 ENCOUNTER — Ambulatory Visit (INDEPENDENT_AMBULATORY_CARE_PROVIDER_SITE_OTHER): Payer: Managed Care, Other (non HMO) | Admitting: Endocrinology

## 2016-09-14 VITALS — BP 128/76 | HR 90 | Ht 70.0 in | Wt 253.0 lb

## 2016-09-14 DIAGNOSIS — E1122 Type 2 diabetes mellitus with diabetic chronic kidney disease: Secondary | ICD-10-CM

## 2016-09-14 DIAGNOSIS — E291 Testicular hypofunction: Secondary | ICD-10-CM | POA: Diagnosis not present

## 2016-09-14 DIAGNOSIS — Z23 Encounter for immunization: Secondary | ICD-10-CM

## 2016-09-14 DIAGNOSIS — N182 Chronic kidney disease, stage 2 (mild): Secondary | ICD-10-CM

## 2016-09-14 LAB — POCT GLYCOSYLATED HEMOGLOBIN (HGB A1C): Hemoglobin A1C: 7

## 2016-09-14 NOTE — Patient Instructions (Addendum)
Please come back for a follow-up appointment in 4 months.    check your blood sugar twice a day.  vary the time of day when you check, between before the 3 meals, and at bedtime.  also check if you have symptoms of your blood sugar being too high or too low.  please keep a record of the readings and bring it to your next appointment here.  You can write it on any piece of paper.  please call us sooner if your blood sugar goes below 70, or if you have a lot of readings over 200.  blood tests are requested for you today.  We'll let you know about the results.  Please continue the same medications for diabetes.

## 2016-09-14 NOTE — Progress Notes (Signed)
Subjective:    Patient ID: Bryan Haley, male    DOB: [birth year], 65 y.o.   MRN: [id number]  HPI Pt returns for f/u of diabetes mellitus: DM type: 2. Dx'ed: 2001.  Complications: renal insufficiency and CAD.  Therapy: trulicity and 3 oral meds.   DKA: never.  Severe hypoglycemia: never.  Pancreatitis: never.   Other: insurance declined weight-loss surgery; he took insulin 2009-2016; oral rx options are limited by renal insuff and edema.   Interval history:  no cbg record, but states cbg's are well-controlled.  pt states he feels well in general.  He has had only 1 episode of hypoglycemia, and this was mild.  Past Medical History:  Diagnosis Date  . COLONIC POLYPS, HX OF 10/02/2008  . Coronary artery disease   . Coronary atherosclerosis of native coronary artery 12/05/2013   Prox RCA, and PLA DES 12/04/13.   . Diabetic retinopathy of both eyes ([place])    Laser surgery, Dr. Zadie Rhine  . 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 Exertional shortness of breath    "last 2-3 yrs" (12/04/2013)  . GOUT 07/11/2007  . Hyperlipidemia   . HYPERTENSION 07/06/2007  . HYPOGONADISM 06/11/2010  . OSA (obstructive sleep apnea) 10/08/2009   wears CPAP "more than 1/2 the time"  . Other and unspecified angina pectoris   . RENAL INSUFFICIENCY 12/11/2008    Past Surgical History:  Procedure Laterality Date  . CARPAL TUNNEL RELEASE Left ~ 2004  . COLONOSCOPY    . CORONARY ANGIOPLASTY WITH STENT PLACEMENT  12/04/2013   "2 stents"  . EYE EXAMINATION UNDER ANESTHESIA W/ RETINAL CRYOTHERAPY AND RETINAL LASER Bilateral 2005-2010   "for my diabetes" (12/04/2013)  . LEFT HEART CATHETERIZATION WITH CORONARY ANGIOGRAM Bilateral 12/04/2013   Procedure: LEFT HEART CATHETERIZATION WITH CORONARY ANGIOGRAM;  Surgeon: Candee Furbish, MD;  Location[facility] CATH LAB;  Service: Cardiovascular;  Laterality: Bilateral;    Social History   Social History  . Marital status: Married   Spouse name: N/A  . Number of children: N/A  . Years of education: N/A   Occupational History  . sales    Social History Main Topics  . Smoking status: Former Smoker    Packs/day: 1.50    Years: 45.00    Types: Cigarettes    Quit date: 10/02/2010  . Smokeless tobacco: Never Used  . Alcohol use 0.6 oz/week    1 Shots of liquor per week  . Drug use: No  . Sexual activity: Yes   Other Topics Concern  . Not on file   Social History Narrative   Regular exercise-no   Pt went to seminar to quit smoking    Current Outpatient Prescriptions on File Prior to Visit  Medication Sig Dispense Refill  . allopurinol (ZYLOPRIM) 300 MG tablet Take 1 tablet (300 mg total) by mouth daily. 100 tablet 3  . amLODipine (NORVASC) 10 MG tablet Take 1 tablet (10 mg total) by mouth daily. 100 tablet 3  . ANDROGEL PUMP 20.25 MG/ACT (1.62%) GEL APPLY 5 PUMPS ONTO THE SKIN EVERY DAY 75 g 2  . aspirin 81 MG chewable tablet Chew 1 tablet (81 mg total) by mouth daily.    . bromocriptine (PARLODEL) 5 MG capsule Take 1 capsule (5 mg total) by mouth daily. 30 capsule 11  . canagliflozin (INVOKANA) 100 MG TABS tablet Take 1 tablet (100 mg total) by mouth daily before breakfast. 30 tablet 11  .  doxazosin (CARDURA) 8 MG tablet Take 1 tablet (8 mg total) by mouth at bedtime. 100 tablet 3  . Dulaglutide (TRULICITY) 1.5 0000000 SOPN Inject 1.5 mg into the skin once a week. 4 pen 3  . fluticasone (FLONASE) 50 MCG/ACT nasal spray PLACE 1 SPRAY INTO BOTH NOSTRILS DAILY. 16 g 10  . glucose blood (ONE TOUCH ULTRA TEST) test strip Use to test blood sugar twice daily. Dx: E10.22 100 each 2  . indomethacin (INDOCIN SR) 75 MG CR capsule TAKE 1 CAPSULE (75 MG TOTAL) BY MOUTH  DAILY. 100 capsule 1  . losartan (COZAAR) 100 MG tablet Take 1 tablet (100 mg total) by mouth 2 (two) times daily. 200 tablet 3  . ONE TOUCH ULTRA TEST test strip USE TO TEST TWICE A DAY 100 each 3  . repaglinide (PRANDIN) 2 MG tablet Take 2 tablets (4 mg  total) by mouth 3 (three) times daily before meals. 180 tablet 11  . simvastatin (ZOCOR) 40 MG tablet Take 1 tablet (40 mg total) by mouth at bedtime. 100 tablet 3  . varenicline (CHANTIX CONTINUING MONTH PAK) 1 MG tablet Take 1 tablet (1 mg total) by mouth 2 (two) times daily. 60 tablet 5  . losartan (COZAAR) 100 MG tablet TAKE 1 TABLET TWICE A DAY (Patient not taking: Reported on 09/14/2016) 200 tablet 0   No current facility-administered medications on file prior to visit.     No Known Allergies  Family History  Problem Relation Age of Onset  . Heart disease Father   . Heart attack Father   . Diabetes Other   . Hyperlipidemia Other   . Hypertension Other   . Cancer Neg Hx     No family history of colon cancer  . Colon cancer Neg Hx   . Esophageal cancer Neg Hx   . Stomach cancer Neg Hx   . Rectal cancer Neg Hx   . Stroke Neg Hx     BP 128/76   Pulse 90   Ht 5\' 10"  (1.778 m)   Wt 253 lb (114.8 kg)   SpO2 96%   BMI 36.30 kg/m    Review of Systems Denies LOC    Objective:   Physical Exam VITAL SIGNS:  See vs page GENERAL: no distress Pulses: dorsalis pedis intact bilat.   MSK: no deformity of the feet CV: no leg edema Skin:  no ulcer on the feet.  normal color and temp on the feet. Neuro: sensation is intact to touch on the feet.   Lab Results  Component Value Date   CREATININE 1.55 (H) 11/18/2015   BUN 22 11/18/2015   NA 140 11/18/2015   K 4.0 11/18/2015   CL 102 11/18/2015   CO2 29 11/18/2015   A1c=7.0%    Assessment & Plan:  Renal insuff: this limits rx options Type 2 DM: well-controlled Hypogonadism: due for recheck.  Patient is advised the following: Patient Instructions  Please come back for a follow-up appointment in 4 months.    check your blood sugar twice a day.  vary the time of day when you check, between before the 3 meals, and at bedtime.  also check if you have symptoms of your blood sugar being too high or too low.  please keep a  record of the readings and bring it to your next appointment here.  You can write it on any piece of paper.  please call us sooner if your blood sugar goes below 70, or if you have  a lot of readings over 200.  blood tests are requested for you today.  We'll let you know about the results.  Please continue the same medications for diabetes.

## 2016-09-21 ENCOUNTER — Other Ambulatory Visit: Payer: Self-pay | Admitting: Endocrinology

## 2016-09-25 ENCOUNTER — Other Ambulatory Visit: Payer: Self-pay | Admitting: Endocrinology

## 2016-10-03 ENCOUNTER — Other Ambulatory Visit: Payer: Self-pay | Admitting: Family Medicine

## 2016-10-04 ENCOUNTER — Other Ambulatory Visit: Payer: Self-pay

## 2016-10-04 MED ORDER — BROMOCRIPTINE MESYLATE 5 MG PO CAPS: 5.0000 mg | ORAL_CAPSULE | Freq: Every day | ORAL | 1 refills | Status: DC

## 2016-10-04 MED ORDER — REPAGLINIDE 2 MG PO TABS: 4.0000 mg | ORAL_TABLET | Freq: Three times a day (TID) | ORAL | 1 refills | Status: DC

## 2016-10-11 ENCOUNTER — Other Ambulatory Visit: Payer: Self-pay | Admitting: Emergency Medicine

## 2016-10-11 MED ORDER — FLUTICASONE PROPIONATE 50 MCG/ACT NA SUSP: NASAL | 1 refills | Status: DC

## 2016-10-12 ENCOUNTER — Other Ambulatory Visit: Payer: Self-pay | Admitting: Family Medicine

## 2016-10-19 ENCOUNTER — Encounter: Payer: Self-pay | Admitting: *Deleted

## 2016-10-28 ENCOUNTER — Ambulatory Visit: Payer: Managed Care, Other (non HMO) | Admitting: Cardiology

## 2016-11-03 ENCOUNTER — Other Ambulatory Visit: Payer: Self-pay | Admitting: Family Medicine

## 2016-11-09 ENCOUNTER — Other Ambulatory Visit (INDEPENDENT_AMBULATORY_CARE_PROVIDER_SITE_OTHER): Payer: Managed Care, Other (non HMO)

## 2016-11-09 DIAGNOSIS — Z0000 Encounter for general adult medical examination without abnormal findings: Secondary | ICD-10-CM | POA: Diagnosis not present

## 2016-11-09 DIAGNOSIS — R7989 Other specified abnormal findings of blood chemistry: Secondary | ICD-10-CM

## 2016-11-09 LAB — HEMOGLOBIN A1C: HEMOGLOBIN A1C: 8.6 % — AB (ref 4.6–6.5)

## 2016-11-09 LAB — LIPID PANEL
CHOL/HDL RATIO: 4
CHOLESTEROL: 118 mg/dL (ref 0–200)
HDL: 31.1 mg/dL — ABNORMAL LOW (ref >39.00)
NonHDL: 87.35
TRIGLYCERIDES: 297 mg/dL — AB (ref 0.0–149.0)
VLDL: 59.4 mg/dL — ABNORMAL HIGH (ref 0.0–40.0)

## 2016-11-09 LAB — CBC WITH DIFFERENTIAL/PLATELET
BASOS PCT: 0.3 % (ref 0.0–3.0)
Basophils Absolute: 0 10*3/uL (ref 0.0–0.1)
EOS PCT: 0.6 % (ref 0.0–5.0)
Eosinophils Absolute: 0 10*3/uL (ref 0.0–0.7)
HCT: 42.2 % (ref 39.0–52.0)
HEMOGLOBIN: 14.3 g/dL (ref 13.0–17.0)
LYMPHS ABS: 1.2 10*3/uL (ref 0.7–4.0)
Lymphocytes Relative: 13.7 % (ref 12.0–46.0)
MCHC: 34 g/dL (ref 30.0–36.0)
MCV: 91.3 fl (ref 78.0–100.0)
MONO ABS: 0.5 10*3/uL (ref 0.1–1.0)
MONOS PCT: 6.5 % (ref 3.0–12.0)
Neutro Abs: 6.6 10*3/uL (ref 1.4–7.7)
Neutrophils Relative %: 78.9 % — ABNORMAL HIGH (ref 43.0–77.0)
Platelets: 163 10*3/uL (ref 150.0–400.0)
RBC: 4.62 Mil/uL (ref 4.22–5.81)
RDW: 13.5 % (ref 11.5–15.5)
WBC: 8.4 10*3/uL (ref 4.0–10.5)

## 2016-11-09 LAB — HEPATIC FUNCTION PANEL
ALBUMIN: 3.9 g/dL (ref 3.5–5.2)
ALT: 19 U/L (ref 0–53)
AST: 14 U/L (ref 0–37)
Alkaline Phosphatase: 102 U/L (ref 39–117)
BILIRUBIN TOTAL: 0.5 mg/dL (ref 0.2–1.2)
Bilirubin, Direct: 0.1 mg/dL (ref 0.0–0.3)
Total Protein: 6.4 g/dL (ref 6.0–8.3)

## 2016-11-09 LAB — LDL CHOLESTEROL, DIRECT: Direct LDL: 43 mg/dL

## 2016-11-09 LAB — POC URINALSYSI DIPSTICK (AUTOMATED)
Bilirubin, UA: NEGATIVE
KETONES UA: NEGATIVE
LEUKOCYTES UA: NEGATIVE
NITRITE UA: NEGATIVE
PH UA: 5
Spec Grav, UA: 1.02
UROBILINOGEN UA: 0.2

## 2016-11-09 LAB — BASIC METABOLIC PANEL
BUN: 21 mg/dL (ref 6–23)
CHLORIDE: 104 meq/L (ref 96–112)
CO2: 28 mEq/L (ref 19–32)
Calcium: 8.8 mg/dL (ref 8.4–10.5)
Creatinine, Ser: 1.61 mg/dL — ABNORMAL HIGH (ref 0.40–1.50)
GFR: 45.76 mL/min — ABNORMAL LOW (ref >60.00)
GLUCOSE: 277 mg/dL — AB (ref 70–99)
POTASSIUM: 4.1 meq/L (ref 3.5–5.1)
Sodium: 140 mEq/L (ref 135–145)

## 2016-11-09 LAB — MICROALBUMIN / CREATININE URINE RATIO
CREATININE, U: 85.2 mg/dL
MICROALB UR: 296.5 mg/dL — AB (ref 0.0–1.9)
MICROALB/CREAT RATIO: 347.9 mg/g — AB (ref 0.0–30.0)

## 2016-11-09 LAB — TSH: TSH: 2.39 u[IU]/mL (ref 0.35–4.50)

## 2016-11-09 LAB — PSA: PSA: 1.51 ng/mL (ref 0.10–4.00)

## 2016-11-14 ENCOUNTER — Encounter: Payer: Self-pay | Admitting: Cardiology

## 2016-11-14 ENCOUNTER — Ambulatory Visit (INDEPENDENT_AMBULATORY_CARE_PROVIDER_SITE_OTHER): Payer: Managed Care, Other (non HMO) | Admitting: Cardiology

## 2016-11-14 VITALS — BP 130/64 | HR 68 | Ht 70.0 in | Wt 256.2 lb

## 2016-11-14 DIAGNOSIS — I208 Other forms of angina pectoris: Secondary | ICD-10-CM | POA: Diagnosis not present

## 2016-11-14 DIAGNOSIS — I2510 Atherosclerotic heart disease of native coronary artery without angina pectoris: Secondary | ICD-10-CM

## 2016-11-14 DIAGNOSIS — I2583 Coronary atherosclerosis due to lipid rich plaque: Secondary | ICD-10-CM

## 2016-11-14 DIAGNOSIS — E7800 Pure hypercholesterolemia, unspecified: Secondary | ICD-10-CM

## 2016-11-14 DIAGNOSIS — I10 Essential (primary) hypertension: Secondary | ICD-10-CM | POA: Diagnosis not present

## 2016-11-14 DIAGNOSIS — I440 Atrioventricular block, first degree: Secondary | ICD-10-CM

## 2016-11-14 DIAGNOSIS — I2089 Other forms of angina pectoris: Secondary | ICD-10-CM

## 2016-11-14 NOTE — Patient Instructions (Signed)

## 2016-11-14 NOTE — Progress Notes (Signed)
[facility]. [address], [address] [place], [place]  [address] Phone: [phone] Fax:  [phone]  Date:  11/14/2016   ID:  Bryan Haley, DOB 11-Aug-1951, MRN [id number]  PCP:  [name], MD   History of Present Illness: Bryan Haley is a 65 y.o. 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 He underwent successful percutaneous intervention of the proximal PLA as well as the proximal RCA utilizing drug-eluting stents. Dual antiplatelet therapy. Isosorbide, statin. LEFT radial artery approach.  He's been feeling very well since the stent placement. Doing well. No chest pain.  He is still working.  Overall no complaints. Taking his medications. Now on ARB off ACE-I. Now on ASA only. Mild SOB with extreme activity. Off of beta blocker. Still struggling with weight however. R>L LE edema, prior surg. No tob. Chantix worked.     Wt Readings from Last 3 Encounters:  11/14/16 256 lb 3.2 oz (116.2 kg)  09/14/16 253 lb (114.8 kg)  09/03/16 249 lb (112.9 kg)     Past Medical History:  Diagnosis Date  . COLONIC POLYPS, HX OF 10/02/2008  . Coronary artery disease   . Coronary atherosclerosis of native coronary artery 12/05/2013   Prox RCA, and PLA DES 12/04/13.   . Diabetic retinopathy of both eyes ([place])    Laser surgery, Dr. Zadie Rhine  . 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 Exertional shortness of breath    "last 2-3 yrs" (12/04/2013)  . GOUT 07/11/2007  . Hyperlipidemia   . HYPERTENSION 07/06/2007  . HYPOGONADISM 06/11/2010  . OSA (obstructive sleep apnea) 10/08/2009   wears CPAP "more than 1/2 the time"  . Other and unspecified angina pectoris   . RENAL INSUFFICIENCY 12/11/2008    Past Surgical History:  Procedure  Laterality Date  . CARPAL TUNNEL RELEASE Left ~ 2004  . COLONOSCOPY    . CORONARY ANGIOPLASTY WITH STENT PLACEMENT  12/04/2013   "2 stents"  . EYE EXAMINATION UNDER ANESTHESIA W/ RETINAL CRYOTHERAPY AND RETINAL LASER Bilateral 2005-2010   "for my diabetes" (12/04/2013)  . LEFT HEART CATHETERIZATION WITH CORONARY ANGIOGRAM Bilateral 12/04/2013   Procedure: LEFT HEART CATHETERIZATION WITH CORONARY ANGIOGRAM;  Surgeon: Candee Furbish, MD;  Location[facility] C[facility] LAB;  Service: Cardiovascular;  Laterality: Bilateral;    Current Outpatient Prescriptions  Medication Sig Dispense Refill  . allopurinol (ZYLOPRIM) 300 MG tablet Take 1 tablet (300 mg total) by mouth daily. 100 tablet 3  . amLODipine (NORVASC) 10 MG tablet Take 1 tablet (10 mg total) by mouth daily. 100 tablet 3  . ANDROGEL PUMP 20.25 MG/ACT (1.62%) GEL APPLY 5 PUMPS ONTO THE SKIN EVERY DAY 75 g 2  . aspirin 81 MG chewable tablet Chew 1 tablet (81 mg total) by mouth daily.    . bromocriptine (PARLODEL) 5 MG capsule Take 1 capsule (5 mg total) by mouth daily. 90 capsule 1  . doxazosin (CARDURA) 8 MG tablet Take 1 tablet (8 mg total) by mouth at bedtime. 100 tablet 3  . fluticasone (FLONASE) 50 MCG/ACT nasal spray PLACE 1 SPRAY INTO BOTH NOSTRILS DAILY 32 g 10  . glucose blood (ONE TOUCH ULTRA TEST) test strip Use to test blood sugar twice daily. Dx: E10.22 100 each 2  . indomethacin (INDOCIN SR) 75 MG  CR capsule TAKE 1 CAPSULE (75 MG TOTAL) BY MOUTH  DAILY. 100 capsule 1  . INVOKANA 100 MG TABS tablet TAKE 1 TABLET BY MOUTH EVERY DAY BEFORE BREAKFAST 30 tablet 11  . losartan (COZAAR) 100 MG tablet Take 1 tablet (100 mg total) by mouth 2 (two) times daily. 200 tablet 3  . ONE TOUCH ULTRA TEST test strip USE TO TEST BLOOD USGAR TWICE A DAY 100 each 3  . repaglinide (PRANDIN) 2 MG tablet Take 2 tablets (4 mg total) by mouth 3 (three) times daily before meals. 540 tablet 1  . simvastatin (ZOCOR) 40 MG tablet Take 1 tablet (40 mg total) by mouth at  bedtime. 100 tablet 3  . TRULICITY 1.5 0000000 SOPN INJECT 1.5MG  INTO THE SKIN ONCE A WEEK. 12 pen 3   No current facility-administered medications for this visit.     Allergies:   No Known Allergies  Social History:  The patient  reports that he quit smoking about 6 years ago. His smoking use included Cigarettes. He has a 67.50 pack-year smoking history. He has never used smokeless tobacco. He reports that he drinks about 0.6 oz of alcohol per week . He reports that he does not use drugs.   ROS:  Please see the history of present illness.   Denies any bleeding, syncope, orthopnea, PND  PHYSICAL EXAM: VS:  BP 130/64   Pulse 68   Ht 5\' 10"  (1.778 m)   Wt 256 lb 3.2 oz (116.2 kg)   BMI 36.76 kg/m  Well nourished, well developed, in no acute distress  HEENT: normal  Neck: no JVD  Cardiac:  normal S1, S2; RRR; soft systolic murmur LLSB  Lungs:  clear to auscultation bilaterally, no wheezing, rhonchi or rales  Abd: soft, nontender, no hepatomegaly, obese Ext: trace edema B Skin: warm and dry  Neuro: no focal abnormalities noted  EKG:   EKG ordered today 11/14/16-sinus rhythm, first-degree AV block 68 bpm with poor R wave progression, incomplete right bundle branch block pattern, left anterior fascicular block 05/01/15 Sinus brady 55 with  First degree AV block. Personally viewed.   Nuclear stress 12/14: Intermediate risk, inferolateral reversible defect.  Cardiac catheterization 12/04/13-proximal RCA, PLA disease. Status post DES to both regions.   Labs: 05/09/14: Hemoglobin A1c 8.1   ASSESSMENT AND PLAN:  1. Coronary artery disease-status post percutaneous intervention as described above. RCA region. Overall doing well. No exertional anginal symptoms.  USE LEFT RADIAL in future.  2. First-degree AV block/bradycardia-we have discontinued his beta blocker Coreg 3.125 at previous visit. Doing well now. PR interval 276 ms, slightly decreased from prior. 3. Angina-resolved post  percutaneous intervention. Multidrug regimen. NTG rx.  Discontinued Isosorbide.  Discontinued his low-dose beta blocker , carvedilol 3.125 mg because of sinus bradycardia rate 55 with first-degree AV block of 298 ms.   4. Hyperlipidemia-continue with statin therapy. Last LDL was 48. Goal less than 70. On simvastatin 40 mg. Dr. Sherren Mocha monitoring. Checking soon. Having physical. 5. Hypertension-currently well controlled. 6. Tobacco use-WAS ABLE TO QUIT on Chantix. 7. Morbid obesity-continue to encourage weight loss. 8. 12 month followup or sooner if necessary.  Signed, Candee Furbish, MD [facility]  11/14/2016 8:23 AM

## 2016-11-16 ENCOUNTER — Encounter: Payer: Self-pay | Admitting: Family Medicine

## 2016-11-16 ENCOUNTER — Ambulatory Visit (INDEPENDENT_AMBULATORY_CARE_PROVIDER_SITE_OTHER): Payer: Managed Care, Other (non HMO) | Admitting: Family Medicine

## 2016-11-16 VITALS — BP 128/60 | HR 69 | Temp 98.2°F | Ht 70.0 in | Wt 252.4 lb

## 2016-11-16 DIAGNOSIS — E1122 Type 2 diabetes mellitus with diabetic chronic kidney disease: Secondary | ICD-10-CM

## 2016-11-16 DIAGNOSIS — T560X1D Toxic effect of lead and its compounds, accidental (unintentional), subsequent encounter: Secondary | ICD-10-CM

## 2016-11-16 DIAGNOSIS — N259 Disorder resulting from impaired renal tubular function, unspecified: Secondary | ICD-10-CM | POA: Diagnosis not present

## 2016-11-16 DIAGNOSIS — M1A1790 Lead-induced chronic gout, unspecified ankle and foot, without tophus (tophi): Secondary | ICD-10-CM

## 2016-11-16 DIAGNOSIS — N182 Chronic kidney disease, stage 2 (mild): Secondary | ICD-10-CM

## 2016-11-16 DIAGNOSIS — I10 Essential (primary) hypertension: Secondary | ICD-10-CM | POA: Diagnosis not present

## 2016-11-16 DIAGNOSIS — E7800 Pure hypercholesterolemia, unspecified: Secondary | ICD-10-CM | POA: Diagnosis not present

## 2016-11-16 DIAGNOSIS — M109 Gout, unspecified: Secondary | ICD-10-CM

## 2016-11-16 MED ORDER — DOXAZOSIN MESYLATE 8 MG PO TABS: 8.0000 mg | ORAL_TABLET | Freq: Every day | ORAL | 3 refills | Status: DC

## 2016-11-16 MED ORDER — FLUTICASONE PROPIONATE 50 MCG/ACT NA SUSP: NASAL | 10 refills | Status: AC

## 2016-11-16 MED ORDER — ALLOPURINOL 300 MG PO TABS: 300.0000 mg | ORAL_TABLET | Freq: Every day | ORAL | 4 refills | Status: DC

## 2016-11-16 MED ORDER — INDOMETHACIN ER 75 MG PO CPCR: ORAL_CAPSULE | ORAL | 4 refills | Status: DC

## 2016-11-16 MED ORDER — LOSARTAN POTASSIUM 100 MG PO TABS: 100.0000 mg | ORAL_TABLET | Freq: Two times a day (BID) | ORAL | 3 refills | Status: DC

## 2016-11-16 MED ORDER — AMLODIPINE BESYLATE 10 MG PO TABS: 10.0000 mg | ORAL_TABLET | Freq: Every day | ORAL | 3 refills | Status: DC

## 2016-11-16 NOTE — Progress Notes (Signed)
Pre visit review using our clinic review tool, if applicable. No additional management support is needed unless otherwise documented below in the visit note. 

## 2016-11-16 NOTE — Patient Instructions (Signed)
As we discussed,,,,,,,,,, restart your diet and exercise program  Follow-up with Dr. Loanne Drilling as outlined  Continue current medications  Follow-up with me one year sooner if any problems however if you wish to switch to one of our new folks,,,,,,, the way to do that is to make an appointment for an office visit to get to meet them and let them meet you in view all your medicines and medical history

## 2016-11-16 NOTE — Progress Notes (Signed)
Bryan Haley is a 65 year old married male nonsmoker who comes in today for evaluation because of a history of a metabolic syndrome  He has diabetes and is treated by Dr. Loanne Drilling. He's on multiple medication and has done well until this past fall. His last A1c with Dr. Ebony Hail was 7.0. This past fall his mother got sick with pancreatic cancer. He went to stay with her got off his diet and exercise program. Recent A1c here December 13 was 8.6. He's now back on his diet and exercise program. Blood sugars were in the 280 range. He says her down down to 160.  He has underlying hypertension. He's on Norvasc 10 mg, Cardura 8 mg, Cozaar 100 mg twice a day. Blood pressure 120/60 at goal  He takes indomethacin 75 mg daily because of severe osteoarthritis and joint pain from previous gout attacks. He's on allopurinol 3 or milligrams daily and is had no further attacks. He has a history of testosterone deficiency and sees urology. He's on AndroGel pump.  He gets routine eye care, dental care........ upper dental plate natural lower teeth....., Colonoscopy 2014 was normal  Vaccinations up-to-date  He has light skin and light eyes. We froze a seborrheic keratosis on his ear 3 years ago. Is now recurred.  Weight today is 252. BMI is 36.802 obese class II.  Cognitive function normal he does not exercise daily home health safety reviewed no issues identified, no guns in the house, he does have a healthcare power of attorney and living well  14 point review of systems reviewed and are negative except for above  BP 128/60 (BP Location: Left Arm, Patient Position: Sitting, Cuff Size: Normal)   Pulse 69   Temp 98.2 F (36.8 C) (Oral)   Ht 5\' 10"  (1.778 m)   Wt 252 lb 6.4 oz (114.5 kg)   BMI 36.22 kg/m  Examination HEENT were negative except for an upper dental plate neck was supple no adenopathy thyroid normal no carotid bruits cardiopulmonary exam normal except for distant heart sounds....Marland KitchenMarland Kitchen he saw his cardiologist  this past Monday had a and evaluation including a cardiogram which showed no change.... Abdominal exam negative genitalia normal uncircumcised male rectum normal stool guaiac-negative prostate 1+ smooth nonnodular BPH extremity, skin normal peripheral pulses normal except for fungal infection of his toenails.  Diabetes not at goal......Marland Kitchen because patient is gotten off his diet and exercise program because he was attending his mother as she was dying.........Marland Kitchen restart program follow-up A1c in 3 months  #2 hypertension at goal........ continue current therapy  #3 history of gout....... continue allopurinol  #4 osteoarthritis.....Marland Kitchen continue the indomethacin GFR 45.76 creatinine 1.61.  #6 renal insufficiency secondary to diabetes....... stable  #7 obesity......... again encouraged diet exercise and weight loss.  #8 testosterone deficiency....... testosterone supplement via Dr. Loanne Drilling

## 2016-11-17 ENCOUNTER — Other Ambulatory Visit: Payer: Self-pay | Admitting: Endocrinology

## 2016-11-27 ENCOUNTER — Other Ambulatory Visit: Payer: Self-pay | Admitting: Family Medicine

## 2016-11-27 DIAGNOSIS — M109 Gout, unspecified: Secondary | ICD-10-CM

## 2016-12-07 ENCOUNTER — Other Ambulatory Visit: Payer: Self-pay | Admitting: Family Medicine

## 2016-12-12 ENCOUNTER — Other Ambulatory Visit: Payer: Self-pay | Admitting: Endocrinology

## 2017-01-01 ENCOUNTER — Other Ambulatory Visit: Payer: Self-pay | Admitting: Family Medicine

## 2017-01-17 ENCOUNTER — Ambulatory Visit (INDEPENDENT_AMBULATORY_CARE_PROVIDER_SITE_OTHER): Payer: Managed Care, Other (non HMO) | Admitting: Endocrinology

## 2017-01-17 ENCOUNTER — Encounter: Payer: Self-pay | Admitting: Endocrinology

## 2017-01-17 VITALS — BP 132/82 | HR 61 | Ht 70.0 in | Wt 250.0 lb

## 2017-01-17 DIAGNOSIS — N182 Chronic kidney disease, stage 2 (mild): Secondary | ICD-10-CM

## 2017-01-17 DIAGNOSIS — E1122 Type 2 diabetes mellitus with diabetic chronic kidney disease: Secondary | ICD-10-CM

## 2017-01-17 LAB — POCT GLYCOSYLATED HEMOGLOBIN (HGB A1C): HEMOGLOBIN A1C: 7.3

## 2017-01-17 NOTE — Patient Instructions (Addendum)
Please come back for a follow-up appointment in 3 months.    check your blood sugar twice a day.  vary the time of day when you check, between before the 3 meals, and at bedtime.  also check if you have symptoms of your blood sugar being too high or too low.  please keep a record of the readings and bring it to your next appointment here.  You can write it on any piece of paper.  please call us sooner if your blood sugar goes below 70, or if you have a lot of readings over 200.  Please continue the same medications for diabetes for now.   Your kidneys are slightly off, so the indomethacin is not safe to take.  Please discuss with Dr Sherren Mocha.  Also I would like to increase the invokana, but we need to resolve the indomethacin subject first.

## 2017-01-17 NOTE — Progress Notes (Signed)
Subjective:    Patient ID: Bryan Haley, male    DOB: 1951-02-06, 66 y.o.   MRN: [id number]  HPI Pt returns for f/u of diabetes mellitus: DM type: 2. Dx'ed: 2001.  Complications: renal insufficiency and CAD.  Therapy: trulicity and 3 oral meds.   DKA: never.  Severe hypoglycemia: never.  Pancreatitis: never.   Other: insurance declines weight-loss surgery; he took insulin 2009-2016; oral rx options are limited by renal insuff and edema.   Interval history:  no cbg record, but states cbg's are well-controlled.  pt states he feels well in general.  Since last ov, he has had only 1 episode of hypoglycemia, and this was mild.   Past Medical History:  Diagnosis Date  . COLONIC POLYPS, HX OF 10/02/2008  . Coronary artery disease   . Coronary atherosclerosis of native coronary artery 12/05/2013   Prox RCA, and PLA DES 12/04/13.   . Diabetic retinopathy of both eyes ([place])    Laser surgery, Dr. Zadie Rhine  . 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 Exertional shortness of breath    "last 2-3 yrs" (12/04/2013)  . GOUT 07/11/2007  . Hyperlipidemia   . HYPERTENSION 07/06/2007  . HYPOGONADISM 06/11/2010  . OSA (obstructive sleep apnea) 10/08/2009   wears CPAP "more than 1/2 the time"  . Other and unspecified angina pectoris   . RENAL INSUFFICIENCY 12/11/2008    Past Surgical History:  Procedure Laterality Date  . CARPAL TUNNEL RELEASE Left ~ 2004  . COLONOSCOPY    . CORONARY ANGIOPLASTY WITH STENT PLACEMENT  12/04/2013   "2 stents"  . EYE EXAMINATION UNDER ANESTHESIA W/ RETINAL CRYOTHERAPY AND RETINAL LASER Bilateral 2005-2010   "for my diabetes" (12/04/2013)  . LEFT HEART CATHETERIZATION WITH CORONARY ANGIOGRAM Bilateral 12/04/2013   Procedure: LEFT HEART CATHETERIZATION WITH CORONARY ANGIOGRAM;  Surgeon: Candee Furbish, MD;  Location: [facility] CATH LAB;  Service: Cardiovascular;  Laterality: Bilateral;    Social History   Social History  . Marital  status: Married    Spouse name: N/A  . Number of children: N/A  . Years of education: N/A   Occupational History  . sales    Social History Main Topics  . Smoking status: Former Smoker    Packs/day: 1.50    Years: 45.00    Types: Cigarettes    Quit date: 10/02/2010  . Smokeless tobacco: Never Used  . Alcohol use 0.6 oz/week    1 Shots of liquor per week  . Drug use: No  . Sexual activity: Yes   Other Topics Concern  . Not on file   Social History Narrative   Regular exercise-no   Pt went to seminar to quit smoking    Current Outpatient Prescriptions on File Prior to Visit  Medication Sig Dispense Refill  . allopurinol (ZYLOPRIM) 300 MG tablet Take 1 tablet (300 mg total) by mouth daily. 100 tablet 4  . amLODipine (NORVASC) 10 MG tablet Take 1 tablet (10 mg total) by mouth daily. 100 tablet 3  . ANDROGEL PUMP 20.25 MG/ACT (1.62%) GEL APPLY 5 PUMPS ONTO THE SKIN EVERY DAY 75 g 2  . aspirin 81 MG chewable tablet Chew 1 tablet (81 mg total) by mouth daily.    . bromocriptine (PARLODEL) 5 MG capsule Take 1 capsule (5 mg total) by mouth daily. 90 capsule 1  . doxazosin (CARDURA) 8 MG tablet Take 1 tablet (8 mg total) by mouth at bedtime.  100 tablet 3  . fluticasone (FLONASE) 50 MCG/ACT nasal spray PLACE 1 SPRAY INTO BOTH NOSTRILS DAILY 32 g 10  . glucose blood (ONE TOUCH ULTRA TEST) test strip Use to test blood sugar twice daily. Dx: E10.22 100 each 2  . indomethacin (INDOCIN SR) 75 MG CR capsule TAKE 1 CAPSULE (75 MG TOTAL) BY MOUTH  DAILY. 100 capsule 4  . INVOKANA 100 MG TABS tablet TAKE 1 TABLET BY MOUTH EVERY DAY BEFORE BREAKFAST 30 tablet 11  . losartan (COZAAR) 100 MG tablet Take 1 tablet (100 mg total) by mouth 2 (two) times daily. 200 tablet 3  . ONE TOUCH ULTRA TEST test strip USE TO TEST BLOOD USGAR TWICE A DAY 100 each 3  . repaglinide (PRANDIN) 2 MG tablet Take 2 tablets (4 mg total) by mouth 3 (three) times daily before meals. 540 tablet 1  . simvastatin (ZOCOR) 40  MG tablet TAKE 1 TABLET (40 MG TOTAL) BY MOUTH AT BEDTIME. 100 tablet 2  . TRULICITY 1.5 0000000 SOPN INJECT 1.5MG  INTO THE SKIN ONCE A WEEK. 12 pen 3   No current facility-administered medications on file prior to visit.     No Known Allergies  Family History  Problem Relation Age of Onset  . Cancer Mother   . Heart disease Father   . Heart attack Father   . Diabetes Other   . Hyperlipidemia Other   . Hypertension Other   . Colon cancer Neg Hx   . Esophageal cancer Neg Hx   . Stomach cancer Neg Hx   . Rectal cancer Neg Hx   . Stroke Neg Hx     BP 132/82   Pulse 61   Ht 5\' 10"  (1.778 m)   Wt 250 lb (113.4 kg)   SpO2 94%   BMI 35.87 kg/m    Review of Systems He denies hypoglycemia    Objective:   Physical Exam VITAL SIGNS:  See vs page GENERAL: no distress Pulses: dorsalis pedis intact bilat.   MSK: no deformity of the feet, except for a few hammer toes on the left foot CV: no leg edema, but there are bilat leg vv's.  Skin:  no ulcer on the feet.  normal color and temp on the feet.  Neuro: sensation is intact to touch on the feet.  Lab Results  Component Value Date   CREATININE 1.61 (H) 11/09/2016   BUN 21 11/09/2016   NA 140 11/09/2016   K 4.1 11/09/2016   CL 104 11/09/2016   CO2 28 11/09/2016   A1c=7.3%    Assessment & Plan:  Type 2 DM: he needs increased rx, if it can be done with a regimen that avoids or minimizes hypoglycemia.  Renal insuff, worse: this limits rx options.  I would like to increase invokana, but NSAID should be addressed first.    Patient is advised the following: Patient Instructions  Please come back for a follow-up appointment in 3 months.    check your blood sugar twice a day.  vary the time of day when you check, between before the 3 meals, and at bedtime.  also check if you have symptoms of your blood sugar being too high or too low.  please keep a record of the readings and bring it to your next appointment here.  You can  write it on any piece of paper.  please call us sooner if your blood sugar goes below 70, or if you have a lot of readings over 200.  Please continue the same medications for diabetes for now.   Your kidneys are slightly off, so the indomethacin is not safe to take.  Please discuss with Dr Sherren Mocha.  Also I would like to increase the invokana, but we need to resolve the indomethacin subject first.

## 2017-01-18 ENCOUNTER — Encounter: Payer: Self-pay | Admitting: Family Medicine

## 2017-01-19 ENCOUNTER — Telehealth: Payer: Self-pay | Admitting: Endocrinology

## 2017-01-19 NOTE — Telephone Encounter (Signed)
That is between you and dr Sherren Mocha.

## 2017-01-19 NOTE — Telephone Encounter (Signed)
Patient is calling on the status of medication   indomethacin (INDOCIN SR) 75 MG CR capsule  Please advise

## 2017-01-19 NOTE — Telephone Encounter (Signed)
I contacted the patient. Patient stated he had recently contacted Dr. Honor Junes office about the the indomethacin being D/c. Patient was advised by Dr. Honor Junes office since it was recommended at his last appointment here for the indomethacin to be D/c'd, the alternative medication should come from our office. Please see mychart message from 01/18/2017 with Dr. Honor Junes office.  Patient would like to know how he should proceed?

## 2017-01-19 NOTE — Telephone Encounter (Signed)
Please see note below, what should be rx to replace the Indomethacin

## 2017-01-20 NOTE — Telephone Encounter (Signed)
I contacted the patient and advised of message.  [name],  I wanted you to be advised of this message as well so you could notify Dr. Sherren Mocha of Dr. Cordelia Pen response.   Thanks!

## 2017-01-23 NOTE — Telephone Encounter (Signed)
Thank you. Noted.

## 2017-01-27 NOTE — Telephone Encounter (Signed)
I contacted the patient and advised based off the MyChart message Dr. Sherren Mocha will be back in the office on 01/30/2017 and he will be advised of Dr. Cordelia Pen message. Patient advised the alternative medication will need to come from Dr. Sherren Mocha. Patient voiced understanding.

## 2017-01-27 NOTE — Telephone Encounter (Signed)
Patient is calling on the status of medication indomethacin (INDOCIN SR) 75 MG CR capsule  Please advise

## 2017-02-07 ENCOUNTER — Telehealth: Payer: Self-pay

## 2017-02-07 ENCOUNTER — Encounter: Payer: Self-pay | Admitting: Family Medicine

## 2017-02-07 NOTE — Telephone Encounter (Signed)
See message. Dr. Honor Junes office has not contacted the patient back and he is unsure on how to proceed with his medication. Could you please advise further?

## 2017-02-07 NOTE — Telephone Encounter (Signed)
Just to clarify, should the patient continue the indomethacin?

## 2017-02-07 NOTE — Telephone Encounter (Signed)
I contacted the patient and advised him to contact Dr. Honor Junes office via voicemail to further discuss.

## 2017-02-07 NOTE — Telephone Encounter (Signed)
Please continue the same medications. 

## 2017-02-07 NOTE — Telephone Encounter (Signed)
Please get Dr. Sherren Mocha recommendations.

## 2017-02-07 NOTE — Telephone Encounter (Signed)
Patient is calling on the status of last message please advise

## 2017-02-07 NOTE — Telephone Encounter (Signed)
Bryan Haley, Bryan Haley wanted me to send you a message regarding his indomethacin. Patient has not been contacted yet on how Dr. Sherren Mocha would like to proceed with him staying on the medication or coming off of it and he is getting frustrated. Dr. Loanne Drilling would like for Dr. Sherren Mocha to handle this requested due to him being his pcp. Thanks for your help!

## 2017-02-07 NOTE — Telephone Encounter (Signed)
I do not prescribe this medication, so I am not the person to ask that.

## 2017-02-08 NOTE — Telephone Encounter (Signed)
Spoke to the pt.  He states endo would like him off the indomethacin due to his kidneys.  Pt will come in on 02/13/17 to discuss alternative medication with Dr. Sherren Mocha.

## 2017-02-13 ENCOUNTER — Ambulatory Visit: Payer: Managed Care, Other (non HMO) | Admitting: Family Medicine

## 2017-02-22 ENCOUNTER — Encounter: Payer: Self-pay | Admitting: Family Medicine

## 2017-02-22 ENCOUNTER — Ambulatory Visit (INDEPENDENT_AMBULATORY_CARE_PROVIDER_SITE_OTHER): Payer: Managed Care, Other (non HMO) | Admitting: Family Medicine

## 2017-02-22 VITALS — BP 130/60 | Temp 98.2°F | Wt 256.0 lb

## 2017-02-22 DIAGNOSIS — N259 Disorder resulting from impaired renal tubular function, unspecified: Secondary | ICD-10-CM

## 2017-02-22 DIAGNOSIS — M79671 Pain in right foot: Secondary | ICD-10-CM

## 2017-02-22 MED ORDER — TRAMADOL HCL 50 MG PO TABS: ORAL_TABLET | ORAL | 3 refills | Status: DC

## 2017-02-22 NOTE — Patient Instructions (Signed)
Tramadol 50 mg.............. one half tab twice a day  Tylenol plain............ 2 tabs twice a day  Follow-up with Dr. Loanne Drilling as outlined.Marland Kitchen

## 2017-02-22 NOTE — Progress Notes (Signed)
Bryan Haley is a 66 year old married male nonsmoker who comes in today to discuss his medications  He is a type I diabetic and is followed in endocrinology by Dr. Loanne Drilling. His microalbumin has shot up dramatically over the past 12 months. Dr. Loanne Drilling once him to go off the indomethacin however we'll try to go off the indomethacin the past his joint pain is so bad he can't walk.  We discussed various options  I agree with Dr. Loanne Drilling we should go off the indomethacin and also avoid any other nonsteroidal anti-inflammatories that he might be taking when necessary  BP 130/60 (BP Location: Left Arm, Patient Position: Sitting, Cuff Size: Large)   Temp 98.2 F (36.8 C) (Oral)   Wt 256 lb (116.1 kg)   BMI 36.73 kg/m  General he is a well-developed well-nourished male no acute distress  #1 type 1 diabetes..........Marland Kitchen with worsening renal function....... stop indomethacin....... 2 Tylenol twice a day...Marland KitchenMarland KitchenMarland Kitchen tramadol 50 one half tab twice a day

## 2017-02-22 NOTE — Progress Notes (Signed)
Pre visit review using our clinic review tool, if applicable. No additional management support is needed unless otherwise documented below in the visit note. 

## 2017-03-28 ENCOUNTER — Other Ambulatory Visit: Payer: Self-pay | Admitting: Endocrinology

## 2017-04-14 ENCOUNTER — Other Ambulatory Visit: Payer: Self-pay | Admitting: Endocrinology

## 2017-04-17 ENCOUNTER — Encounter: Payer: Self-pay | Admitting: Endocrinology

## 2017-04-17 ENCOUNTER — Ambulatory Visit (INDEPENDENT_AMBULATORY_CARE_PROVIDER_SITE_OTHER): Payer: Managed Care, Other (non HMO) | Admitting: Endocrinology

## 2017-04-17 VITALS — BP 128/60 | HR 70 | Ht 70.0 in | Wt 257.0 lb

## 2017-04-17 DIAGNOSIS — E1122 Type 2 diabetes mellitus with diabetic chronic kidney disease: Secondary | ICD-10-CM | POA: Diagnosis not present

## 2017-04-17 DIAGNOSIS — N182 Chronic kidney disease, stage 2 (mild): Secondary | ICD-10-CM | POA: Diagnosis not present

## 2017-04-17 LAB — BASIC METABOLIC PANEL
BUN: 21 mg/dL (ref 6–23)
CHLORIDE: 103 meq/L (ref 96–112)
CO2: 28 mEq/L (ref 19–32)
Calcium: 8.7 mg/dL (ref 8.4–10.5)
Creatinine, Ser: 1.7 mg/dL — ABNORMAL HIGH (ref 0.40–1.50)
GFR: 42.92 mL/min — ABNORMAL LOW (ref >60.00)
Glucose, Bld: 259 mg/dL — ABNORMAL HIGH (ref 70–99)
POTASSIUM: 4.1 meq/L (ref 3.5–5.1)
Sodium: 138 mEq/L (ref 135–145)

## 2017-04-17 LAB — POCT GLYCOSYLATED HEMOGLOBIN (HGB A1C): HEMOGLOBIN A1C: 7.7

## 2017-04-17 NOTE — Patient Instructions (Addendum)
Please come back for a follow-up appointment in 4 months.    check your blood sugar twice a day.  vary the time of day when you check, between before the 3 meals, and at bedtime.  also check if you have symptoms of your blood sugar being too high or too low.  please keep a record of the readings and bring it to your next appointment here.  You can write it on any piece of paper.  please call us sooner if your blood sugar goes below 70, or if you have a lot of readings over 200.  blood tests are requested for you today.  We'll let you know about the results. This will tell us what your diabetes medication options are.  Based on the results, I hope we can increase diabetes medication.

## 2017-04-17 NOTE — Addendum Note (Signed)
Addended by: Verlin Grills T on: 04/17/2017 02:36 PM   Modules accepted: Orders

## 2017-04-17 NOTE — Progress Notes (Signed)
Subjective:    Patient ID: Bryan Haley, male    DOB: [birth year], 66 y.o.   MRN: [id number]  HPI Pt returns for f/u of diabetes mellitus: DM type: 2. Dx'ed: 2001.  Complications: renal insufficiency and CAD.  Therapy: trulicity and 3 oral meds.   DKA: never.  Severe hypoglycemia: never.  Pancreatitis: never.   Other: insurance declines weight-loss surgery; he took insulin 2009-2016; oral rx options are limited by renal insuff and edema.   Interval history:  no cbg record, but states cbg's are well-controlled.  pt states he feels well in general.   He is off indocin now.   Past Medical History:  Diagnosis Date  . COLONIC POLYPS, HX OF 10/02/2008  . Coronary artery disease   . Coronary atherosclerosis of native coronary artery 12/05/2013   Prox RCA, and PLA DES 12/04/13.   . Diabetic retinopathy of both eyes ([place])    Laser surgery, Dr. Zadie Rhine  . 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 Exertional shortness of breath    "last 2-3 yrs" (12/04/2013)  . GOUT 07/11/2007  . Hyperlipidemia   . HYPERTENSION 07/06/2007  . HYPOGONADISM 06/11/2010  . OSA (obstructive sleep apnea) 10/08/2009   wears CPAP "more than 1/2 the time"  . Other and unspecified angina pectoris   . RENAL INSUFFICIENCY 12/11/2008    Past Surgical History:  Procedure Laterality Date  . CARPAL TUNNEL RELEASE Left ~ 2004  . COLONOSCOPY    . CORONARY ANGIOPLASTY WITH STENT PLACEMENT  12/04/2013   "2 stents"  . EYE EXAMINATION UNDER ANESTHESIA W/ RETINAL CRYOTHERAPY AND RETINAL LASER Bilateral 2005-2010   "for my diabetes" (12/04/2013)  . LEFT HEART CATHETERIZATION WITH CORONARY ANGIOGRAM Bilateral 12/04/2013   Procedure: LEFT HEART CATHETERIZATION WITH CORONARY ANGIOGRAM;  Surgeon: Candee Furbish, MD;  Location[facility] C[facility] LAB;  Service: Cardiovascular;  Laterality: Bilateral;    Social History   Social History  . Marital status: Married    Spouse name: N/A  . Number of  children: N/A  . Years of education: N/A   Occupational History  . sales    Social History Main Topics  . Smoking status: Former Smoker    Packs/day: 1.50    Years: 45.00    Types: Cigarettes    Quit date: 10/02/2010  . Smokeless tobacco: Never Used  . Alcohol use 0.6 oz/week    1 Shots of liquor per week  . Drug use: No  . Sexual activity: Yes   Other Topics Concern  . Not on file   Social History Narrative   Regular exercise-no   Pt went to seminar to quit smoking    Current Outpatient Prescriptions on File Prior to Visit  Medication Sig Dispense Refill  . allopurinol (ZYLOPRIM) 300 MG tablet Take 1 tablet (300 mg total) by mouth daily. 100 tablet 4  . amLODipine (NORVASC) 10 MG tablet Take 1 tablet (10 mg total) by mouth daily. 100 tablet 3  . ANDROGEL PUMP 20.25 MG/ACT (1.62%) GEL APPLY 5 PUMPS ONTO SKIN EVERY DAY 75 g 2  . aspirin 81 MG chewable tablet Chew 1 tablet (81 mg total) by mouth daily.    . bromocriptine (PARLODEL) 5 MG capsule Take 1 capsule (5 mg total) by mouth daily. 90 capsule 1  . doxazosin (CARDURA) 8 MG tablet Take 1 tablet (8 mg total) by mouth at bedtime. 100 tablet 3  . fluticasone (FLONASE) 50 MCG/ACT nasal  spray PLACE 1 SPRAY INTO BOTH NOSTRILS DAILY 32 g 10  . glucose blood (ONE TOUCH ULTRA TEST) test strip Use to test blood sugar twice daily. Dx: E10.22 100 each 2  . indomethacin (INDOCIN SR) 75 MG CR capsule TAKE 1 CAPSULE (75 MG TOTAL) BY MOUTH  DAILY. 100 capsule 4  . INVOKANA 100 MG TABS tablet TAKE 1 TABLET BY MOUTH EVERY DAY BEFORE BREAKFAST 30 tablet 11  . losartan (COZAAR) 100 MG tablet Take 1 tablet (100 mg total) by mouth 2 (two) times daily. 200 tablet 3  . ONE TOUCH ULTRA TEST test strip USE TO TEST BLOOD USGAR TWICE A DAY 100 each 3  . repaglinide (PRANDIN) 2 MG tablet TAKE 2 TABLETS (4 MG TOTAL) BY MOUTH 3 (THREE) TIMES DAILY BEFORE MEALS. 540 tablet 1  . simvastatin (ZOCOR) 40 MG tablet TAKE 1 TABLET (40 MG TOTAL) BY MOUTH AT  BEDTIME. 100 tablet 2  . traMADol (ULTRAM) 50 MG tablet One half tab twice a day 100 tablet 3  . TRULICITY 1.5 YB/6.3SL SOPN INJECT 1.5MG  INTO THE SKIN ONCE A WEEK. 12 pen 3   No current facility-administered medications on file prior to visit.     No Known Allergies  Family History  Problem Relation Age of Onset  . Cancer Mother   . Heart disease Father   . Heart attack Father   . Diabetes Other   . Hyperlipidemia Other   . Hypertension Other   . Colon cancer Neg Hx   . Esophageal cancer Neg Hx   . Stomach cancer Neg Hx   . Rectal cancer Neg Hx   . Stroke Neg Hx     BP 128/60   Pulse 70   Ht 5\' 10"  (1.778 m)   Wt 257 lb (116.6 kg)   SpO2 94%   BMI 36.88 kg/m    Review of Systems He denies hypoglycemia.      Objective:   Physical Exam VITAL SIGNS:  See vs page GENERAL: no distress Pulses: dorsalis pedis intact bilat.   MSK: no deformity of the feet, except for a few hammer toes on the left foot CV: trace bilat leg edema, and bilat leg vv's.  Skin:  no ulcer on the feet.  normal color and temp on the feet.  Neuro: sensation is intact to touch on the feet.   A1c=7.7%    Assessment & Plan:  Type 2 DM, with CAD: worse. Renal insuff: due for recheck.   Patient Instructions  Please come back for a follow-up appointment in 4 months.    check your blood sugar twice a day.  vary the time of day when you check, between before the 3 meals, and at bedtime.  also check if you have symptoms of your blood sugar being too high or too low.  please keep a record of the readings and bring it to your next appointment here.  You can write it on any piece of paper.  please call us sooner if your blood sugar goes below 70, or if you have a lot of readings over 200.  blood tests are requested for you today.  We'll let you know about the results. This will tell us what your diabetes medication options are.  Based on the results, I hope we can increase diabetes medication.

## 2017-06-27 ENCOUNTER — Encounter: Payer: Self-pay | Admitting: Family Medicine

## 2017-06-28 ENCOUNTER — Other Ambulatory Visit: Payer: Self-pay | Admitting: Family Medicine

## 2017-06-28 NOTE — Telephone Encounter (Signed)
Pt is up to date on his visits and Labs, pt requests for refills on Chantix 1 mg tab, please Advise if ok to provide refill.

## 2017-07-03 NOTE — Telephone Encounter (Signed)
Rx was sent to patients pharmacy.

## 2017-07-11 ENCOUNTER — Other Ambulatory Visit: Payer: Self-pay | Admitting: Endocrinology

## 2017-07-11 ENCOUNTER — Other Ambulatory Visit: Payer: Self-pay | Admitting: Family Medicine

## 2017-07-12 ENCOUNTER — Other Ambulatory Visit: Payer: Self-pay | Admitting: Endocrinology

## 2017-08-10 ENCOUNTER — Other Ambulatory Visit: Payer: Self-pay | Admitting: Endocrinology

## 2017-08-16 ENCOUNTER — Ambulatory Visit (INDEPENDENT_AMBULATORY_CARE_PROVIDER_SITE_OTHER): Payer: Managed Care, Other (non HMO) | Admitting: Endocrinology

## 2017-08-16 ENCOUNTER — Encounter: Payer: Self-pay | Admitting: Endocrinology

## 2017-08-16 VITALS — BP 140/76 | HR 74 | Wt 249.2 lb

## 2017-08-16 DIAGNOSIS — N182 Chronic kidney disease, stage 2 (mild): Secondary | ICD-10-CM | POA: Diagnosis not present

## 2017-08-16 DIAGNOSIS — E1122 Type 2 diabetes mellitus with diabetic chronic kidney disease: Secondary | ICD-10-CM | POA: Diagnosis not present

## 2017-08-16 LAB — POCT GLYCOSYLATED HEMOGLOBIN (HGB A1C): Hemoglobin A1C: 7.5

## 2017-08-16 MED ORDER — ACARBOSE 25 MG PO TABS: 25.0000 mg | ORAL_TABLET | Freq: Every day | ORAL | 3 refills | Status: DC

## 2017-08-16 MED ORDER — SEMAGLUTIDE (1 MG/DOSE) 2 MG/1.5ML ~~LOC~~ SOPN: 1.0000 mg | PEN_INJECTOR | SUBCUTANEOUS | 3 refills | Status: DC

## 2017-08-16 NOTE — Patient Instructions (Addendum)
Please come back for a follow-up appointment in 4 months.    check your blood sugar twice a day.  vary the time of day when you check, between before the 3 meals, and at bedtime.  also check if you have symptoms of your blood sugar being too high or too low.  please keep a record of the readings and bring it to your next appointment here.  You can write it on any piece of paper.  please call us sooner if your blood sugar goes below 70, or if you have a lot of readings over 200.  I have sent a prescription to your pharmacy: to change trulicity to "ozempic," and to add "acarbose."

## 2017-08-16 NOTE — Progress Notes (Signed)
Subjective:    Patient ID: Bryan Haley, male    DOB: Sep 09, 1951, 66 y.o.   MRN: [id number]  HPI Pt returns for f/u of diabetes mellitus: DM type: 2. Dx'ed: 2001.  Complications: renal insufficiency and CAD.  Therapy: trulicity and 3 oral meds.   DKA: never.  Severe hypoglycemia: never.   Pancreatitis: never.   Other: insurance declines weight-loss surgery; he took insulin 2009-2016; oral rx options are limited by renal insuff and edema.   Interval history:  no cbg record, but states cbg's are in the mid-100's.  pt states he feels well in general.   Past Medical History:  Diagnosis Date  . COLONIC POLYPS, HX OF 10/02/2008  . Coronary artery disease   . Coronary atherosclerosis of native coronary artery 12/05/2013   Prox RCA, and PLA DES 12/04/13.   . Diabetic retinopathy of both eyes ([place])    Laser surgery, Dr. Zadie Rhine  . 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 Exertional shortness of breath    "last 2-3 yrs" (12/04/2013)  . GOUT 07/11/2007  . Hyperlipidemia   . HYPERTENSION 07/06/2007  . HYPOGONADISM 06/11/2010  . OSA (obstructive sleep apnea) 10/08/2009   wears CPAP "more than 1/2 the time"  . Other and unspecified angina pectoris   . RENAL INSUFFICIENCY 12/11/2008    Past Surgical History:  Procedure Laterality Date  . CARPAL TUNNEL RELEASE Left ~ 2004  . COLONOSCOPY    . CORONARY ANGIOPLASTY WITH STENT PLACEMENT  12/04/2013   "2 stents"  . EYE EXAMINATION UNDER ANESTHESIA W/ RETINAL CRYOTHERAPY AND RETINAL LASER Bilateral 2005-2010   "for my diabetes" (12/04/2013)  . LEFT HEART CATHETERIZATION WITH CORONARY ANGIOGRAM Bilateral 12/04/2013   Procedure: LEFT HEART CATHETERIZATION WITH CORONARY ANGIOGRAM;  Surgeon: Candee Furbish, MD;  Location: [facility] CATH LAB;  Service: Cardiovascular;  Laterality: Bilateral;    Social History   Social History  . Marital status: Married    Spouse name: N/A  . Number of children: N/A  . Years of  education: N/A   Occupational History  . sales    Social History Main Topics  . Smoking status: Former Smoker    Packs/day: 1.50    Years: 45.00    Types: Cigarettes    Quit date: 10/02/2010  . Smokeless tobacco: Never Used  . Alcohol use 0.6 oz/week    1 Shots of liquor per week  . Drug use: No  . Sexual activity: Yes   Other Topics Concern  . Not on file   Social History Narrative   Regular exercise-no   Pt went to seminar to quit smoking    Current Outpatient Prescriptions on File Prior to Visit  Medication Sig Dispense Refill  . allopurinol (ZYLOPRIM) 300 MG tablet Take 1 tablet (300 mg total) by mouth daily. 100 tablet 4  . amLODipine (NORVASC) 10 MG tablet Take 1 tablet (10 mg total) by mouth daily. 100 tablet 3  . ANDROGEL PUMP 20.25 MG/ACT (1.62%) GEL APPLY 5 PUMPS ONTO SKIN EVERY DAY 75 g 2  . aspirin 81 MG chewable tablet Chew 1 tablet (81 mg total) by mouth daily.    . bromocriptine (PARLODEL) 5 MG capsule Take 1 capsule (5 mg total) by mouth daily. 90 capsule 1  . CHANTIX CONTINUING MONTH PAK 1 MG tablet TAKE 1 TABLET (1 MG TOTAL) BY MOUTH 2 (TWO) TIMES DAILY. 56 tablet 2  . doxazosin (CARDURA) 8 MG tablet  Take 1 tablet (8 mg total) by mouth at bedtime. 100 tablet 3  . fluticasone (FLONASE) 50 MCG/ACT nasal spray PLACE 1 SPRAY INTO BOTH NOSTRILS DAILY 32 g 10  . glucose blood (ONE TOUCH ULTRA TEST) test strip Use to test blood sugar twice daily. Dx: E10.22 100 each 2  . INVOKANA 100 MG TABS tablet TAKE 1 TABLET BY MOUTH EVERY DAY BEFORE BREAKFAST 30 tablet 3  . losartan (COZAAR) 100 MG tablet Take 1 tablet (100 mg total) by mouth 2 (two) times daily. 200 tablet 3  . ONE TOUCH ULTRA TEST test strip USE TO TEST BLOOD USGAR TWICE A DAY 100 each 3  . repaglinide (PRANDIN) 2 MG tablet TAKE 2 TABLETS (4 MG TOTAL) BY MOUTH 3 (THREE) TIMES DAILY BEFORE MEALS. 540 tablet 1  . simvastatin (ZOCOR) 40 MG tablet TAKE 1 TABLET (40 MG TOTAL) BY MOUTH AT BEDTIME. 100 tablet 2  .  traMADol (ULTRAM) 50 MG tablet One half tab twice a day 100 tablet 3  . indomethacin (INDOCIN SR) 75 MG CR capsule TAKE 1 CAPSULE (75 MG TOTAL) BY MOUTH  DAILY. (Patient not taking: Reported on 08/16/2017) 100 capsule 4   No current facility-administered medications on file prior to visit.     No Known Allergies  Family History  Problem Relation Age of Onset  . Cancer Mother   . Heart disease Father   . Heart attack Father   . Diabetes Other   . Hyperlipidemia Other   . Hypertension Other   . Colon cancer Neg Hx   . Esophageal cancer Neg Hx   . Stomach cancer Neg Hx   . Rectal cancer Neg Hx   . Stroke Neg Hx     BP 140/76   Pulse 74   Wt 249 lb 3.2 oz (113 kg)   SpO2 92%   BMI 35.76 kg/m   Review of Systems He denies hypoglycemia    Objective:   Physical Exam VITAL SIGNS:  See vs page GENERAL: no distress Pulses: foot pulses are intact bilaterally.   MSK: no deformity of the feet or ankles.  CV: no edema of the legs or ankles Skin:  no ulcer on the feet or ankles.  normal color and temp on the feet and ankles Neuro: sensation is intact to touch on the feet and ankles.     A1c=7.5%     Assessment & Plan:  Insulin-requiring type 2 DM, with CAD: he needs increased rx.   Patient Instructions  Please come back for a follow-up appointment in 4 months.    check your blood sugar twice a day.  vary the time of day when you check, between before the 3 meals, and at bedtime.  also check if you have symptoms of your blood sugar being too high or too low.  please keep a record of the readings and bring it to your next appointment here.  You can write it on any piece of paper.  please call us sooner if your blood sugar goes below 70, or if you have a lot of readings over 200.  I have sent a prescription to your pharmacy: to change trulicity to "ozempic," and to add "acarbose."

## 2017-08-18 ENCOUNTER — Encounter: Payer: Self-pay | Admitting: Family Medicine

## 2017-08-29 ENCOUNTER — Encounter: Payer: Self-pay | Admitting: Family Medicine

## 2017-09-06 ENCOUNTER — Other Ambulatory Visit: Payer: Self-pay | Admitting: Family Medicine

## 2017-09-06 NOTE — Telephone Encounter (Signed)
Rx for Tramadol has been approved signed and called in to pharmacy for refill. Pt is aware.

## 2017-09-08 ENCOUNTER — Other Ambulatory Visit: Payer: Self-pay | Admitting: Endocrinology

## 2017-09-15 ENCOUNTER — Other Ambulatory Visit: Payer: Self-pay

## 2017-09-15 ENCOUNTER — Other Ambulatory Visit: Payer: Self-pay | Admitting: Family Medicine

## 2017-09-15 MED ORDER — LOSARTAN POTASSIUM 100 MG PO TABS: 100.0000 mg | ORAL_TABLET | Freq: Two times a day (BID) | ORAL | 1 refills | Status: DC

## 2017-10-07 ENCOUNTER — Other Ambulatory Visit: Payer: Self-pay | Admitting: Endocrinology

## 2017-10-10 ENCOUNTER — Encounter: Payer: Self-pay | Admitting: Family Medicine

## 2017-10-26 ENCOUNTER — Other Ambulatory Visit: Payer: Self-pay | Admitting: Endocrinology

## 2017-10-31 ENCOUNTER — Other Ambulatory Visit: Payer: Self-pay | Admitting: Endocrinology

## 2017-11-08 ENCOUNTER — Encounter: Payer: Managed Care, Other (non HMO) | Admitting: Family Medicine

## 2017-11-15 ENCOUNTER — Ambulatory Visit (INDEPENDENT_AMBULATORY_CARE_PROVIDER_SITE_OTHER): Payer: Managed Care, Other (non HMO) | Admitting: Cardiology

## 2017-11-15 ENCOUNTER — Encounter: Payer: Self-pay | Admitting: Cardiology

## 2017-11-15 VITALS — BP 144/71 | HR 66 | Ht 70.0 in | Wt 251.4 lb

## 2017-11-15 DIAGNOSIS — I2510 Atherosclerotic heart disease of native coronary artery without angina pectoris: Secondary | ICD-10-CM | POA: Diagnosis not present

## 2017-11-15 DIAGNOSIS — I2583 Coronary atherosclerosis due to lipid rich plaque: Secondary | ICD-10-CM

## 2017-11-15 DIAGNOSIS — I208 Other forms of angina pectoris: Secondary | ICD-10-CM

## 2017-11-15 DIAGNOSIS — I440 Atrioventricular block, first degree: Secondary | ICD-10-CM

## 2017-11-15 NOTE — Patient Instructions (Signed)
Medication Instructions:  Your physician recommends that you continue on your current medications as directed. Please refer to the Current Medication list given to you today.  Labwork: None ordered   Testing/Procedures: None ordered   Follow-Up: Your physician wants you to follow-up in: 1 year Dr. Marlou Porch. You will receive a reminder letter in the mail two months in advance. If you don't receive a letter, please call our office to schedule the follow-up appointment.  Any Other Special Instructions Will Be Listed Below (If Applicable).     If you need a refill on your cardiac medications before your next appointment, please call your pharmacy.

## 2017-11-15 NOTE — Progress Notes (Signed)
[place]. [address], [address] [place], [place]  [address] Phone: [phone] Fax:  [phone]  Date:  11/15/2017   ID:  Bryan Haley, DOB 23-Mar-1951, MRN [id number]  PCP:  Dorena Cookey, MD   History of Present Illness: Bryan Haley is a 66 y.o. 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 He underwent successful percutaneous intervention of the proximal PLA as well as the proximal RCA utilizing drug-eluting stents. Dual antiplatelet therapy. Isosorbide, statin. LEFT radial artery approach.  He's been feeling very well since the stent placement. Doing well. No chest pain.  He is still working.  Overall no complaints. Taking his medications. Now on ARB off ACE-I. Now on ASA only. Mild SOB with extreme activity. Off of beta blocker. Still struggling with weight however. R>L LE edema, prior surg. No tob. Chantix worked.    11/15/17-overall doing well, no problems, no chest pain syncope bleeding orthopnea.  No changes made in his medications.  Weight has fluctuated.  Encouraged continued weight loss.   Wt Readings from Last 3 Encounters:  11/15/17 251 lb 6.4 oz (114 kg)  08/16/17 249 lb 3.2 oz (113 kg)  04/17/17 257 lb (116.6 kg)     Past Medical History:  Diagnosis Date  . COLONIC POLYPS, HX OF 10/02/2008  . Coronary artery disease   . Coronary atherosclerosis of native coronary artery 12/05/2013   Prox RCA, and PLA DES 12/04/13.   . Diabetic retinopathy of both eyes ([place])    Laser surgery, Dr. Zadie Rhine  . 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 Exertional shortness of breath    "last 2-3 yrs" (12/04/2013)  . GOUT 07/11/2007  . Hyperlipidemia   . HYPERTENSION 07/06/2007  . HYPOGONADISM 06/11/2010  . OSA  (obstructive sleep apnea) 10/08/2009   wears CPAP "more than 1/2 the time"  . Other and unspecified angina pectoris   . RENAL INSUFFICIENCY 12/11/2008    Past Surgical History:  Procedure Laterality Date  . CARPAL TUNNEL RELEASE Left ~ 2004  . COLONOSCOPY    . CORONARY ANGIOPLASTY WITH STENT PLACEMENT  12/04/2013   "2 stents"  . EYE EXAMINATION UNDER ANESTHESIA W/ RETINAL CRYOTHERAPY AND RETINAL LASER Bilateral 2005-2010   "for my diabetes" (12/04/2013)  . LEFT HEART CATHETERIZATION WITH CORONARY ANGIOGRAM Bilateral 12/04/2013   Procedure: LEFT HEART CATHETERIZATION WITH CORONARY ANGIOGRAM;  Surgeon: Candee Furbish, MD;  Location: [facility] CATH LAB;  Service: Cardiovascular;  Laterality: Bilateral;    Current Outpatient Medications  Medication Sig Dispense Refill  . acarbose (PRECOSE) 25 MG tablet Take 1 tablet (25 mg total) by mouth daily with supper. 90 tablet 3  . allopurinol (ZYLOPRIM) 300 MG tablet Take 1 tablet (300 mg total) by mouth daily. 100 tablet 4  . amLODipine (NORVASC) 10 MG tablet Take 1 tablet (10 mg total) by mouth daily. 100 tablet 3  . ANDROGEL PUMP 20.25 MG/ACT (1.62%) GEL APPLY 5 PUMPS ONTO SKIN EVERY DAY 75 g 2  . aspirin 81 MG chewable tablet Chew 1 tablet (81 mg total) by mouth daily.    . bromocriptine (PARLODEL) 5 MG capsule TAKE ONE CAPSULE BY MOUTH EVERY DAY 30 capsule 5  . doxazosin (CARDURA) 8 MG tablet Take 1 tablet (8 mg total) by mouth at bedtime. 100  tablet 3  . fluticasone (FLONASE) 50 MCG/ACT nasal spray PLACE 1 SPRAY INTO BOTH NOSTRILS DAILY 32 g 10  . glucose blood (ONE TOUCH ULTRA TEST) test strip Use to test blood sugar twice daily. Dx: E10.22 100 each 2  . INVOKANA 100 MG TABS tablet TAKE 1 TABLET BY MOUTH EVERY DAY BEFORE BREAKFAST 30 tablet 3  . losartan (COZAAR) 100 MG tablet Take 1 tablet (100 mg total) by mouth 2 (two) times daily. 200 tablet 1  . ONE TOUCH ULTRA TEST test strip USE TO TEST BLOOD USGAR TWICE A DAY 100 each 3  . repaglinide (PRANDIN) 2 MG  tablet TAKE 2 TABLETS (4 MG TOTAL) BY MOUTH 3 (THREE) TIMES DAILY BEFORE MEALS. 540 tablet 1  . Semaglutide (OZEMPIC) 1 MG/DOSE SOPN Inject 1 mg into the skin once a week. 3 pen 3  . simvastatin (ZOCOR) 40 MG tablet TAKE 1 TABLET (40 MG TOTAL) BY MOUTH AT BEDTIME. 100 tablet 1  . traMADol (ULTRAM) 50 MG tablet TAKE 1/2 TAB BY MOUTH TWICE A DAY 100 tablet 1   No current facility-administered medications for this visit.     Allergies:   No Known Allergies  Social History:  The patient  reports that he quit smoking about 7 years ago. His smoking use included cigarettes. He has a 67.50 pack-year smoking history. he has never used smokeless tobacco. He reports that he drinks about 0.6 oz of alcohol per week. He reports that he does not use drugs.   ROS:  Please see the history of present illness.   Denies any bleeding, syncope, orthopnea, PND  PHYSICAL EXAM: VS:  BP (!) 144/71   Pulse 66   Ht 5\' 10"  (1.778 m)   Wt 251 lb 6.4 oz (114 kg)   SpO2 96%   BMI 36.07 kg/m  GEN: Overweight well nourished, well developed, in no acute distress  HEENT: normal  Neck: no JVD, carotid bruits, or masses Cardiac: RRR; soft systolic murmur left lower sternal border,no rubs, or gallops,no edema  Respiratory:  clear to auscultation bilaterally, normal work of breathing GI: soft, nontender, nondistended, + BS MS: no deformity or atrophy  Skin: warm and dry, no rash Neuro:  Alert and Oriented x 3, Strength and sensation are intact Psych: euthymic mood, full affect   EKG:   EKG ordered today 11/15/17-sinus rhythm first-degree AV block 310 ms with incomplete right bundle branch block, left anterior fascicular block personally viewed-prior 11/14/16-sinus rhythm, first-degree AV block 68 bpm with poor R wave progression, incomplete right bundle branch block pattern, left anterior fascicular block 05/01/15 Sinus brady 55 with  First degree AV block. Personally viewed.   Nuclear stress 12/14: Intermediate risk,  inferolateral reversible defect.  Cardiac catheterization 12/04/13-proximal RCA, PLA disease. Status post DES to both regions.   Labs: 05/09/14: Hemoglobin A1c 8.1   ASSESSMENT AND PLAN:  1. Coronary artery disease-status post percutaneous intervention as described above. RCA region.No exertional anginal symptoms.  USE LEFT RADIAL in future.  No changes. 2. First-degree AV block/bradycardia-we have discontinued his beta blocker Coreg 3.125 at previous visit. Doing well now. PR interval 310 - 276 ms.  No syncope 3. Angina-resolved post percutaneous intervention. Multidrug regimen. NTG rx.  Discontinued Isosorbide.  Discontinued his low-dose beta blocker , carvedilol 3.125 mg because of sinus bradycardia rate 55 with first-degree AV block of 298 ms.   4. Hyperlipidemia-continue with statin therapy. Last LDL was 43. Goal less than 70. On simvastatin 40 mg. Dr. Sherren Mocha monitoring.  Overall feeling well. 5. Hypertension-minimally elevated today, continue to monitor. 6. Tobacco use-WAS ABLE TO QUIT on Chantix.  Will occasionally have a cigar with drinking. 7. Morbid obesity-continue to encourage weight loss.  He was down to 240 at one point. 8. His wife has had some issues with her heart and would like to see me in clinic.  This is fine. 9. 12 month followup or sooner if necessary.  Signed, Candee Furbish, MD [facility]  11/15/2017 10:34 AM

## 2017-11-24 ENCOUNTER — Other Ambulatory Visit: Payer: Self-pay | Admitting: Family Medicine

## 2017-11-30 ENCOUNTER — Ambulatory Visit (INDEPENDENT_AMBULATORY_CARE_PROVIDER_SITE_OTHER): Payer: Managed Care, Other (non HMO) | Admitting: Endocrinology

## 2017-11-30 ENCOUNTER — Encounter: Payer: Self-pay | Admitting: Endocrinology

## 2017-11-30 VITALS — BP 160/80 | HR 62 | Wt 257.2 lb

## 2017-11-30 DIAGNOSIS — N182 Chronic kidney disease, stage 2 (mild): Secondary | ICD-10-CM | POA: Diagnosis not present

## 2017-11-30 DIAGNOSIS — R609 Edema, unspecified: Secondary | ICD-10-CM | POA: Diagnosis not present

## 2017-11-30 DIAGNOSIS — E1122 Type 2 diabetes mellitus with diabetic chronic kidney disease: Secondary | ICD-10-CM | POA: Diagnosis not present

## 2017-11-30 LAB — POCT GLYCOSYLATED HEMOGLOBIN (HGB A1C): HEMOGLOBIN A1C: 7.8

## 2017-11-30 MED ORDER — ACARBOSE 25 MG PO TABS: 25.0000 mg | ORAL_TABLET | Freq: Three times a day (TID) | ORAL | 3 refills | Status: DC

## 2017-11-30 NOTE — Patient Instructions (Signed)
I have sent a prescription to your pharmacy, to increase the acarbose.   Please continue the same other diabetes medications.   check your blood sugar once a day.  vary the time of day when you check, between before the 3 meals, and at bedtime.  also check if you have symptoms of your blood sugar being too high or too low.  please keep a record of the readings and bring it to your next appointment here (or you can bring the meter itself).  You can write it on any piece of paper.  please call us sooner if your blood sugar goes below 70, or if you have a lot of readings over 200.   Please come back for a follow-up appointment in 3 months.  

## 2017-11-30 NOTE — Progress Notes (Signed)
Subjective:    Patient ID: Bryan Haley, male    DOB: [birth year], 67 y.o.   MRN: [id number]  HPI Pt returns for f/u of diabetes mellitus: DM type: 2. Dx'ed: 2001.  Complications: renal insufficiency and CAD.  Therapy: ozempic and 4 oral meds.   DKA: never.  Severe hypoglycemia: never.   Pancreatitis: never.   Other: insurance declines weight-loss surgery; he took insulin 2009-2016; oral rx options are limited by renal insuff and edema.   Interval history:  no cbg record, but states cbg's are well-controlled.  pt states he feels well in general.   Past Medical History:  Diagnosis Date  . COLONIC POLYPS, HX OF 10/02/2008  . Coronary artery disease   . Coronary atherosclerosis of native coronary artery 12/05/2013   Prox RCA, and PLA DES 12/04/13.   . Diabetic retinopathy of both eyes ([place])    Laser surgery, Dr. Zadie Rhine  . 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 Exertional shortness of breath    "last 2-3 yrs" (12/04/2013)  . GOUT 07/11/2007  . Hyperlipidemia   . HYPERTENSION 07/06/2007  . HYPOGONADISM 06/11/2010  . OSA (obstructive sleep apnea) 10/08/2009   wears CPAP "more than 1/2 the time"  . Other and unspecified angina pectoris   . RENAL INSUFFICIENCY 12/11/2008    Past Surgical History:  Procedure Laterality Date  . CARPAL TUNNEL RELEASE Left ~ 2004  . COLONOSCOPY    . CORONARY ANGIOPLASTY WITH STENT PLACEMENT  12/04/2013   "2 stents"  . EYE EXAMINATION UNDER ANESTHESIA W/ RETINAL CRYOTHERAPY AND RETINAL LASER Bilateral 2005-2010   "for my diabetes" (12/04/2013)  . LEFT HEART CATHETERIZATION WITH CORONARY ANGIOGRAM Bilateral 12/04/2013   Procedure: LEFT HEART CATHETERIZATION WITH CORONARY ANGIOGRAM;  Surgeon: Candee Furbish, MD;  Location: [facility] CATH LAB;  Service: Cardiovascular;  Laterality: Bilateral;    Social History   Socioeconomic History  . Marital status: Married    Spouse name: Not on file  . Number of children: Not on  file  . Years of education: Not on file  . Highest education level: Not on file  Social Needs  . Financial resource strain: Not on file  . Food insecurity - worry: Not on file  . Food insecurity - inability: Not on file  . Transportation needs - medical: Not on file  . Transportation needs - non-medical: Not on file  Occupational History  . Occupation: Press photographer  Tobacco Use  . Smoking status: Former Smoker    Packs/day: 1.50    Years: 45.00    Pack years: 67.50    Types: Cigarettes    Last attempt to quit: 10/02/2010    Years since quitting: 7.1  . Smokeless tobacco: Never Used  Substance and Sexual Activity  . Alcohol use: Yes    Alcohol/week: 0.6 oz    Types: 1 Shots of liquor per week  . Drug use: No  . Sexual activity: Yes  Other Topics Concern  . Not on file  Social History Narrative   Regular exercise-no   Pt went to seminar to quit smoking    Current Outpatient Medications on File Prior to Visit  Medication Sig Dispense Refill  . allopurinol (ZYLOPRIM) 300 MG tablet Take 1 tablet (300 mg total) by mouth daily. 100 tablet 4  . amLODipine (NORVASC) 10 MG tablet Take 1 tablet (10 mg total) by mouth daily. 100 tablet 3  . ANDROGEL PUMP 20.25 MG/ACT (1.62%)  GEL APPLY 5 PUMPS ONTO SKIN EVERY DAY 75 g 2  . aspirin 81 MG chewable tablet Chew 1 tablet (81 mg total) by mouth daily.    . bromocriptine (PARLODEL) 5 MG capsule TAKE ONE CAPSULE BY MOUTH EVERY DAY 30 capsule 5  . doxazosin (CARDURA) 8 MG tablet Take 1 tablet (8 mg total) by mouth at bedtime. 100 tablet 3  . fluticasone (FLONASE) 50 MCG/ACT nasal spray PLACE 1 SPRAY INTO BOTH NOSTRILS DAILY 32 g 10  . glucose blood (ONE TOUCH ULTRA TEST) test strip Use to test blood sugar twice daily. Dx: E10.22 100 each 2  . INVOKANA 100 MG TABS tablet TAKE 1 TABLET BY MOUTH EVERY DAY BEFORE BREAKFAST 30 tablet 3  . losartan (COZAAR) 100 MG tablet Take 1 tablet (100 mg total) by mouth 2 (two) times daily. 200 tablet 1  . ONE TOUCH  ULTRA TEST test strip USE TO TEST BLOOD USGAR TWICE A DAY 100 each 3  . repaglinide (PRANDIN) 2 MG tablet TAKE 2 TABLETS (4 MG TOTAL) BY MOUTH 3 (THREE) TIMES DAILY BEFORE MEALS. 540 tablet 1  . Semaglutide (OZEMPIC) 1 MG/DOSE SOPN Inject 1 mg into the skin once a week. 3 pen 3  . simvastatin (ZOCOR) 40 MG tablet TAKE 1 TABLET (40 MG TOTAL) BY MOUTH AT BEDTIME. 100 tablet 1  . traMADol (ULTRAM) 50 MG tablet TAKE 1/2 TAB BY MOUTH TWICE A DAY 100 tablet 1  . fluticasone (FLONASE) 50 MCG/ACT nasal spray PLACE 1 SPRAY INTO BOTH NOSTRILS DAILY 16 g 2   No current facility-administered medications on file prior to visit.     No Known Allergies  Family History  Problem Relation Age of Onset  . Cancer Mother   . Heart disease Father   . Heart attack Father   . Diabetes Other   . Hyperlipidemia Other   . Hypertension Other   . Colon cancer Neg Hx   . Esophageal cancer Neg Hx   . Stomach cancer Neg Hx   . Rectal cancer Neg Hx   . Stroke Neg Hx     BP (!) 160/80 (BP Location: Left Arm, Patient Position: Sitting, Cuff Size: Normal)   Pulse 62   Wt 257 lb 3.2 oz (116.7 kg)   SpO2 95%   BMI 36.90 kg/m    Review of Systems He denies hypoglycemia    Objective:   Physical Exam VITAL SIGNS:  See vs page GENERAL: no distress Pulses: foot pulses are intact bilaterally.   MSK: no deformity of the feet or ankles.  CV: 1+ bilat edema of the legs.  Skin:  no ulcer on the feet or ankles.  normal color and temp on the feet and ankles Neuro: sensation is intact to touch on the feet and ankles.    Lab Results  Component Value Date   HGBA1C 7.8 11/30/2017       Assessment & Plan:  Type 2 DM: worse Edema: this limits rx options Renal insuff: this also limits rx options  Patient Instructions  I have sent a prescription to your pharmacy, to increase the acarbose.  Please continue the same other diabetes medications.  check your blood sugar once a day.  vary the time of day when you  check, between before the 3 meals, and at bedtime.  also check if you have symptoms of your blood sugar being too high or too low.  please keep a record of the readings and bring it to your next appointment  here (or you can bring the meter itself).  You can write it on any piece of paper.  please call us sooner if your blood sugar goes below 70, or if you have a lot of readings over 200.Please come back for a follow-up appointment in 3 months.

## 2017-12-05 ENCOUNTER — Other Ambulatory Visit: Payer: Self-pay | Admitting: Endocrinology

## 2017-12-06 ENCOUNTER — Telehealth: Payer: Self-pay | Admitting: Endocrinology

## 2017-12-06 ENCOUNTER — Other Ambulatory Visit: Payer: Self-pay | Admitting: Endocrinology

## 2017-12-06 NOTE — Telephone Encounter (Signed)
Patient is having trouble getting refill on Androgel. Pharmacy told patient that Dr. Cordelia Pen office needs to contact the insurance company in order for patient to receive his script of Androgel.

## 2017-12-08 NOTE — Telephone Encounter (Signed)
I called patient to inform him that PA was sent.

## 2017-12-08 NOTE — Telephone Encounter (Signed)
I have sent PA to plan today & waiting on approval decision.

## 2017-12-12 ENCOUNTER — Encounter: Payer: Self-pay | Admitting: Family Medicine

## 2017-12-12 ENCOUNTER — Other Ambulatory Visit: Payer: Self-pay | Admitting: Family Medicine

## 2017-12-12 ENCOUNTER — Ambulatory Visit (INDEPENDENT_AMBULATORY_CARE_PROVIDER_SITE_OTHER): Payer: Managed Care, Other (non HMO) | Admitting: Family Medicine

## 2017-12-12 VITALS — BP 124/76 | HR 62 | Temp 98.0°F | Ht 70.0 in | Wt 247.0 lb

## 2017-12-12 DIAGNOSIS — M79671 Pain in right foot: Secondary | ICD-10-CM | POA: Diagnosis not present

## 2017-12-12 DIAGNOSIS — E1122 Type 2 diabetes mellitus with diabetic chronic kidney disease: Secondary | ICD-10-CM | POA: Diagnosis not present

## 2017-12-12 DIAGNOSIS — E7800 Pure hypercholesterolemia, unspecified: Secondary | ICD-10-CM | POA: Diagnosis not present

## 2017-12-12 DIAGNOSIS — N401 Enlarged prostate with lower urinary tract symptoms: Secondary | ICD-10-CM

## 2017-12-12 DIAGNOSIS — M109 Gout, unspecified: Secondary | ICD-10-CM | POA: Diagnosis not present

## 2017-12-12 DIAGNOSIS — N259 Disorder resulting from impaired renal tubular function, unspecified: Secondary | ICD-10-CM

## 2017-12-12 DIAGNOSIS — R351 Nocturia: Secondary | ICD-10-CM

## 2017-12-12 DIAGNOSIS — Z0000 Encounter for general adult medical examination without abnormal findings: Secondary | ICD-10-CM | POA: Diagnosis not present

## 2017-12-12 DIAGNOSIS — N182 Chronic kidney disease, stage 2 (mild): Secondary | ICD-10-CM

## 2017-12-12 DIAGNOSIS — I10 Essential (primary) hypertension: Secondary | ICD-10-CM | POA: Diagnosis not present

## 2017-12-12 LAB — CBC WITH DIFFERENTIAL/PLATELET
BASOS ABS: 0 10*3/uL (ref 0.0–0.1)
Basophils Relative: 0.3 % (ref 0.0–3.0)
Eosinophils Absolute: 0.1 10*3/uL (ref 0.0–0.7)
Eosinophils Relative: 1.1 % (ref 0.0–5.0)
HEMATOCRIT: 43.5 % (ref 39.0–52.0)
Hemoglobin: 14.5 g/dL (ref 13.0–17.0)
LYMPHS ABS: 1.3 10*3/uL (ref 0.7–4.0)
Lymphocytes Relative: 19.4 % (ref 12.0–46.0)
MCHC: 33.3 g/dL (ref 30.0–36.0)
MCV: 94.1 fl (ref 78.0–100.0)
MONOS PCT: 7.3 % (ref 3.0–12.0)
Monocytes Absolute: 0.5 10*3/uL (ref 0.1–1.0)
NEUTROS PCT: 71.9 % (ref 43.0–77.0)
Neutro Abs: 4.7 10*3/uL (ref 1.4–7.7)
Platelets: 157 10*3/uL (ref 150.0–400.0)
RBC: 4.62 Mil/uL (ref 4.22–5.81)
RDW: 14.6 % (ref 11.5–15.5)
WBC: 6.6 10*3/uL (ref 4.0–10.5)

## 2017-12-12 LAB — BASIC METABOLIC PANEL
BUN: 24 mg/dL — AB (ref 6–23)
CHLORIDE: 103 meq/L (ref 96–112)
CO2: 29 mEq/L (ref 19–32)
Calcium: 9.1 mg/dL (ref 8.4–10.5)
Creatinine, Ser: 1.81 mg/dL — ABNORMAL HIGH (ref 0.40–1.50)
GFR: 39.84 mL/min — ABNORMAL LOW (ref >60.00)
Glucose, Bld: 189 mg/dL — ABNORMAL HIGH (ref 70–99)
POTASSIUM: 4.5 meq/L (ref 3.5–5.1)
Sodium: 139 mEq/L (ref 135–145)

## 2017-12-12 LAB — LDL CHOLESTEROL, DIRECT: LDL DIRECT: 52 mg/dL

## 2017-12-12 LAB — MICROALBUMIN / CREATININE URINE RATIO
Creatinine,U: 119.6 mg/dL
MICROALB UR: 364.7 mg/dL — AB (ref 0.0–1.9)
Microalb Creat Ratio: 305 mg/g — ABNORMAL HIGH (ref 0.0–30.0)

## 2017-12-12 LAB — TSH: TSH: 2.81 u[IU]/mL (ref 0.35–4.50)

## 2017-12-12 LAB — LIPID PANEL
CHOL/HDL RATIO: 4
Cholesterol: 122 mg/dL (ref 0–200)
HDL: 29.2 mg/dL — ABNORMAL LOW (ref >39.00)
NONHDL: 93.05
TRIGLYCERIDES: 298 mg/dL — AB (ref 0.0–149.0)
VLDL: 59.6 mg/dL — AB (ref 0.0–40.0)

## 2017-12-12 LAB — HEPATIC FUNCTION PANEL
ALBUMIN: 4.1 g/dL (ref 3.5–5.2)
ALT: 23 U/L (ref 0–53)
AST: 17 U/L (ref 0–37)
Alkaline Phosphatase: 80 U/L (ref 39–117)
Bilirubin, Direct: 0.1 mg/dL (ref 0.0–0.3)
Total Bilirubin: 0.6 mg/dL (ref 0.2–1.2)
Total Protein: 6.6 g/dL (ref 6.0–8.3)

## 2017-12-12 LAB — PSA: PSA: 1.62 ng/mL (ref 0.10–4.00)

## 2017-12-12 MED ORDER — AMLODIPINE BESYLATE 10 MG PO TABS: 10.0000 mg | ORAL_TABLET | Freq: Every day | ORAL | 3 refills | Status: DC

## 2017-12-12 MED ORDER — TRAMADOL HCL 50 MG PO TABS: ORAL_TABLET | ORAL | 1 refills | Status: DC

## 2017-12-12 MED ORDER — ALLOPURINOL 300 MG PO TABS: 300.0000 mg | ORAL_TABLET | Freq: Every day | ORAL | 4 refills | Status: DC

## 2017-12-12 MED ORDER — DOXAZOSIN MESYLATE 8 MG PO TABS
8.0000 mg | ORAL_TABLET | Freq: Every day | ORAL | 3 refills | Status: DC
Start: 2017-12-12 — End: 2018-12-13

## 2017-12-12 MED ORDER — SIMVASTATIN 40 MG PO TABS: 40.0000 mg | ORAL_TABLET | Freq: Every day | ORAL | 4 refills | Status: DC

## 2017-12-12 MED ORDER — LOSARTAN POTASSIUM 100 MG PO TABS
100.0000 mg | ORAL_TABLET | Freq: Two times a day (BID) | ORAL | 4 refills | Status: DC
Start: 2017-12-12 — End: 2018-11-16

## 2017-12-12 NOTE — Patient Instructions (Signed)
Labs today.......... I will call you if there is anything abnormal  Good job on the diet............. keep up the good work  Increase her walking by 5 minutes per week to get up to 30 minutes a day  Follow-up in one year for general physical exam sooner if any problems  Call your insurance company find out where you can get the new shingles vaccine........... take 2 Tylenol one hour prior to the vaccination then 2 Tylenol every 6 hours for the first 2 days to help negate the side effects

## 2017-12-12 NOTE — Progress Notes (Signed)
Bryan Haley is a 67 year old married male ex-smoker via the Chantix program, who comes in today for general medical evaluation because of a history of obesity, hypertension, hyperlipidemia, coronary artery disease, severe osteoarthritis with chronic foot pain unrelated to diabetes, gout,.  He saw his cardiologist in December. At that time exam was normal with a EKG showing some significant but unchanged electrical abnormalities. Symptomatic wise sees asymptomatic no angina with walking. He's now walk and 15 minutes per day. Since January 1 he went on a carbohydrate free diet and now is lost 5 pounds. He's followed in endocrinology by Dr. Clois Haley.  We have him on Zocor 300 mg daily because a history of gout  For hypertension he takes Norvasc 10 mg daily, Cardura 8 mg at bedtime...Marland KitchenMarland KitchenMarland Kitchen which also helps with his BPH....... Cozaar 100 mg daily  BP today 124/76  His diabetes is managed by Dr. Mervyn Haley.  Hyperlipidemia we have him on Zocor 40 mg daily. LDLs are in the 40-50 range on this dose.  Dr. Ebony Haley also has a month and her gel because of low testosterone  We have him on a combination of Tylenol 2 tabs twice a day along with tramadol 25 mg twice a day for severe ankle and foot pain related to previous episodes of severe gout. Because of his history of renal insufficiency we've tended to stay away from NSAIDs  He gets routine eye care, dental care, last colonoscopy 2014 showed some polyps. He's due to go back for GI: Protocol.  Vaccinations up-to-date information given on the new shingles vaccine.  14 point review of systems reviewed and otherwise negative  EKG done in cardiology therefore not repeated.  Social history...Marland KitchenMarland KitchenMarland Kitchen married lives here in [place] works for a [employer]. He his wife recently bought a house outside of [name] they're planning on moving there in the next 2 years  BP 124/76 (BP Location: Left Arm, Patient Position: Sitting, Cuff Size: Normal)   Pulse 62   Temp 98 F  (36.7 C) (Oral)   Ht 5\' 10"  (1.778 m)   Wt 247 lb (112 kg)   BMI 35.44 kg/m  Well-developed well-nourished male no acute distress vital signs stable he is afebrile examination of the HEENT was negative specifically no carotid bruits cardiopulmonary exam normal except for distant heart sounds. Abdominal exam was negative genitalia normal circumcised male rectum normal sterile guaiac negative prostate 1+ smooth nonnodular BPH. Extremities normal skin normal peripheral pulses normal  #1 obesity........Marland Kitchen encouraged to continue diet exercise and weight loss  #2 hypertension at goal........ continue current meds  #3 hyperlipidemia........ continue current meds recheck labs  #4 coronary disease.......... status post stents 2015...Marland KitchenMarland KitchenMarland Kitchen asymptomatic  #5 history of gout.......... continue allopurinol  #6 BPH with nocturia......... continue Cardura recheck PSA  #7 low testosterone......... continue treatment by Dr. Mervyn Haley  #8 severe osteoarthritis and foot pain unrelated to diabetes........ continue Tylenol 2 tabs twice a day and tramadol 25 mg twice a day

## 2017-12-13 ENCOUNTER — Other Ambulatory Visit: Payer: Self-pay

## 2017-12-13 ENCOUNTER — Other Ambulatory Visit: Payer: Self-pay | Admitting: Family Medicine

## 2017-12-13 DIAGNOSIS — N401 Enlarged prostate with lower urinary tract symptoms: Secondary | ICD-10-CM | POA: Insufficient documentation

## 2017-12-13 DIAGNOSIS — R351 Nocturia: Secondary | ICD-10-CM

## 2017-12-13 DIAGNOSIS — M109 Gout, unspecified: Secondary | ICD-10-CM

## 2017-12-13 MED ORDER — TESTOSTERONE 20.25 MG/ACT (1.62%) TD GEL: TRANSDERMAL | 2 refills | Status: DC

## 2017-12-15 ENCOUNTER — Ambulatory Visit: Payer: Managed Care, Other (non HMO) | Admitting: Endocrinology

## 2018-01-01 ENCOUNTER — Other Ambulatory Visit: Payer: Self-pay

## 2018-01-01 MED ORDER — BROMOCRIPTINE MESYLATE 5 MG PO CAPS: 5.0000 mg | ORAL_CAPSULE | Freq: Every day | ORAL | 5 refills | Status: DC

## 2018-03-01 ENCOUNTER — Encounter: Payer: Self-pay | Admitting: Endocrinology

## 2018-03-01 ENCOUNTER — Ambulatory Visit (INDEPENDENT_AMBULATORY_CARE_PROVIDER_SITE_OTHER): Payer: Managed Care, Other (non HMO) | Admitting: Endocrinology

## 2018-03-01 VITALS — BP 138/80 | HR 65 | Temp 98.1°F | Ht 70.0 in | Wt 250.0 lb

## 2018-03-01 DIAGNOSIS — E1122 Type 2 diabetes mellitus with diabetic chronic kidney disease: Secondary | ICD-10-CM | POA: Diagnosis not present

## 2018-03-01 DIAGNOSIS — N182 Chronic kidney disease, stage 2 (mild): Secondary | ICD-10-CM

## 2018-03-01 LAB — POCT GLYCOSYLATED HEMOGLOBIN (HGB A1C): Hemoglobin A1C: 7.6

## 2018-03-01 MED ORDER — ACARBOSE 50 MG PO TABS: 50.0000 mg | ORAL_TABLET | Freq: Three times a day (TID) | ORAL | 3 refills | Status: DC

## 2018-03-01 NOTE — Patient Instructions (Addendum)
I have sent a prescription to your pharmacy, to increase the acarbose again.  Please continue the same other diabetes medications.  check your blood sugar once a day.  vary the time of day when you check, between before the 3 meals, and at bedtime.  also check if you have symptoms of your blood sugar being too high or too low.  please keep a record of the readings and bring it to your next appointment here (or you can bring the meter itself).  You can write it on any piece of paper.  please call us sooner if your blood sugar goes below 70, or if you have a lot of readings over 200.Please come back for a follow-up appointment in 4 months.

## 2018-03-01 NOTE — Progress Notes (Signed)
Subjective:    Patient ID: Bryan Haley, male    DOB: 15-Dec-1950, 67 y.o.   MRN: [id number]  HPI Pt returns for f/u of diabetes mellitus: DM type: 2. Dx'ed: 2001.  Complications: renal insufficiency and CAD.  Therapy: ozempic and 4 oral meds.   DKA: never.  Severe hypoglycemia: never.   Pancreatitis: never.   Other: insurance declines weight-loss surgery; he took insulin 2009-2016; oral rx options are limited by renal insuff and edema.   Interval history:  no cbg record, but states cbg's are well-controlled.  pt states he feels well in general.  Past Medical History:  Diagnosis Date  . COLONIC POLYPS, HX OF 10/02/2008  . Coronary artery disease   . Coronary atherosclerosis of native coronary artery 12/05/2013   Prox RCA, and PLA DES 12/04/13.   . Diabetic retinopathy of both eyes ([place])    Laser surgery, Dr. Zadie Rhine  . 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 Exertional shortness of breath    "last 2-3 yrs" (12/04/2013)  . GOUT 07/11/2007  . Hyperlipidemia   . HYPERTENSION 07/06/2007  . HYPOGONADISM 06/11/2010  . OSA (obstructive sleep apnea) 10/08/2009   wears CPAP "more than 1/2 the time"  . Other and unspecified angina pectoris   . RENAL INSUFFICIENCY 12/11/2008    Past Surgical History:  Procedure Laterality Date  . CARPAL TUNNEL RELEASE Left ~ 2004  . COLONOSCOPY    . CORONARY ANGIOPLASTY WITH STENT PLACEMENT  12/04/2013   "2 stents"  . EYE EXAMINATION UNDER ANESTHESIA W/ RETINAL CRYOTHERAPY AND RETINAL LASER Bilateral 2005-2010   "for my diabetes" (12/04/2013)  . LEFT HEART CATHETERIZATION WITH CORONARY ANGIOGRAM Bilateral 12/04/2013   Procedure: LEFT HEART CATHETERIZATION WITH CORONARY ANGIOGRAM;  Surgeon: Candee Furbish, MD;  Location: [facility] CATH LAB;  Service: Cardiovascular;  Laterality: Bilateral;    Social History   Socioeconomic History  . Marital status: Married    Spouse name: Not on file  . Number of children: Not on  file  . Years of education: Not on file  . Highest education level: Not on file  Occupational History  . Occupation: Geographical information systems officer  . Financial resource strain: Not on file  . Food insecurity:    Worry: Not on file    Inability: Not on file  . Transportation needs:    Medical: Not on file    Non-medical: Not on file  Tobacco Use  . Smoking status: Former Smoker    Packs/day: 1.50    Years: 45.00    Pack years: 67.50    Types: Cigarettes    Last attempt to quit: 10/02/2010    Years since quitting: 7.4  . Smokeless tobacco: Never Used  Substance and Sexual Activity  . Alcohol use: Yes    Alcohol/week: 0.6 oz    Types: 1 Shots of liquor per week  . Drug use: No  . Sexual activity: Yes  Lifestyle  . Physical activity:    Days per week: Not on file    Minutes per session: Not on file  . Stress: Not on file  Relationships  . Social connections:    Talks on phone: Not on file    Gets together: Not on file    Attends religious service: Not on file    Active member of club or organization: Not on file    Attends meetings of clubs or organizations: Not on file  Relationship status: Not on file  . Intimate partner violence:    Fear of current or ex partner: Not on file    Emotionally abused: Not on file    Physically abused: Not on file    Forced sexual activity: Not on file  Other Topics Concern  . Not on file  Social History Narrative   Regular exercise-no   Pt went to seminar to quit smoking    Current Outpatient Medications on File Prior to Visit  Medication Sig Dispense Refill  . allopurinol (ZYLOPRIM) 300 MG tablet Take 1 tablet (300 mg total) by mouth daily. 100 tablet 4  . amLODipine (NORVASC) 10 MG tablet Take 1 tablet (10 mg total) by mouth daily. 100 tablet 3  . aspirin 81 MG chewable tablet Chew 1 tablet (81 mg total) by mouth daily.    . bromocriptine (PARLODEL) 5 MG capsule Take 1 capsule (5 mg total) by mouth daily. 30 capsule 5  . doxazosin  (CARDURA) 8 MG tablet Take 1 tablet (8 mg total) by mouth at bedtime. 100 tablet 3  . fluticasone (FLONASE) 50 MCG/ACT nasal spray PLACE 1 SPRAY INTO BOTH NOSTRILS DAILY 32 g 10  . glucose blood (ONE TOUCH ULTRA TEST) test strip Use to test blood sugar twice daily. Dx: E10.22 100 each 2  . INVOKANA 100 MG TABS tablet TAKE 1 TABLET BY MOUTH EVERY DAY BEFORE BREAKFAST 30 tablet 3  . losartan (COZAAR) 100 MG tablet Take 1 tablet (100 mg total) by mouth 2 (two) times daily. 200 tablet 4  . ONE TOUCH ULTRA TEST test strip USE TO TEST BLOOD USGAR TWICE A DAY 100 each 3  . OZEMPIC 1 MG/DOSE SOPN INJECT 1 MG INTO THE SKIN ONCE A WEEK. 12 pen 3  . repaglinide (PRANDIN) 2 MG tablet TAKE 2 TABLETS (4 MG TOTAL) BY MOUTH 3 (THREE) TIMES DAILY BEFORE MEALS. 540 tablet 1  . simvastatin (ZOCOR) 40 MG tablet Take 1 tablet (40 mg total) by mouth at bedtime. 100 tablet 4  . Testosterone (ANDROGEL PUMP) 20.25 MG/ACT (1.62%) GEL APPLY 5 PUMPS ONTO SKIN EVERY DAY 75 g 2  . traMADol (ULTRAM) 50 MG tablet TAKE 1/2 TAB BY MOUTH TWICE A DAY 100 tablet 1   No current facility-administered medications on file prior to visit.     No Known Allergies  Family History  Problem Relation Age of Onset  . Cancer Mother   . Heart disease Father   . Heart attack Father   . Diabetes Other   . Hyperlipidemia Other   . Hypertension Other   . Colon cancer Neg Hx   . Esophageal cancer Neg Hx   . Stomach cancer Neg Hx   . Rectal cancer Neg Hx   . Stroke Neg Hx     BP 138/80 (BP Location: Left Arm, Patient Position: Sitting, Cuff Size: Normal)   Pulse 65   Temp 98.1 F (36.7 C) (Oral)   Ht 5\' 10"  (1.778 m)   Wt 250 lb (113.4 kg)   SpO2 98%   BMI 35.87 kg/m    Review of Systems He denies hypoglycemia.      Objective:   Physical Exam VITAL SIGNS:  See vs page GENERAL: no distress Pulses: foot pulses are intact bilaterally.   MSK: no deformity of the feet or ankles.  CV: 1+ bilat edema of the legs.  Skin:  no  ulcer on the feet or ankles.  normal color and temp on the feet and ankles  Neuro: sensation is intact to touch on the feet and ankles.    Lab Results  Component Value Date   CREATININE 1.81 (H) 12/12/2017   BUN 24 (H) 12/12/2017   NA 139 12/12/2017   K 4.5 12/12/2017   CL 103 12/12/2017   CO2 29 12/12/2017   Lab Results  Component Value Date   HGBA1C 7.6 03/01/2018       Assessment & Plan:  Type 2 DM: he needs increased rx Renal insuff: this limits rx options.   Patient Instructions  I have sent a prescription to your pharmacy, to increase the acarbose again.  Please continue the same other diabetes medications.  check your blood sugar once a day.  vary the time of day when you check, between before the 3 meals, and at bedtime.  also check if you have symptoms of your blood sugar being too high or too low.  please keep a record of the readings and bring it to your next appointment here (or you can bring the meter itself).  You can write it on any piece of paper.  please call us sooner if your blood sugar goes below 70, or if you have a lot of readings over 200.Please come back for a follow-up appointment in 4 months.

## 2018-04-16 ENCOUNTER — Other Ambulatory Visit: Payer: Self-pay | Admitting: Endocrinology

## 2018-04-26 ENCOUNTER — Other Ambulatory Visit: Payer: Self-pay | Admitting: Endocrinology

## 2018-06-07 ENCOUNTER — Other Ambulatory Visit: Payer: Self-pay | Admitting: Endocrinology

## 2018-06-07 NOTE — Telephone Encounter (Signed)
Will you refill in Dr. Cordelia Pen absence?

## 2018-06-13 ENCOUNTER — Other Ambulatory Visit: Payer: Self-pay

## 2018-07-01 ENCOUNTER — Other Ambulatory Visit: Payer: Self-pay | Admitting: Endocrinology

## 2018-07-05 ENCOUNTER — Encounter: Payer: Self-pay | Admitting: Family Medicine

## 2018-07-05 ENCOUNTER — Ambulatory Visit: Payer: Managed Care, Other (non HMO) | Admitting: Family Medicine

## 2018-07-05 VITALS — BP 120/66 | HR 64 | Temp 98.1°F | Ht 70.0 in | Wt 249.4 lb

## 2018-07-05 DIAGNOSIS — L723 Sebaceous cyst: Secondary | ICD-10-CM

## 2018-07-05 NOTE — Progress Notes (Signed)
   Subjective:    Patient ID: Bryan Haley, male    DOB: 1951-02-09, 67 y.o.   MRN: [id number]  HPI Here to check a lump under the right breast that he noticed 3-4 months ago. This does not bother him and it has not changed in size. He is concerned because his mother and sister both died of cancer.    Review of Systems  Constitutional: Negative.   Respiratory: Negative.   Cardiovascular: Negative.        Objective:   Physical Exam  Constitutional: He appears well-developed and well-nourished.  Cardiovascular: Normal rate, regular rhythm, normal heart sounds and intact distal pulses.  Pulmonary/Chest: Effort normal and breath sounds normal.  Skin:  There is a firm mobile non-tender lump just under the skin on the right upper chest          Assessment & Plan:  Sebaceous cyst. I reassured him this is benign and no treatment is needed.  Alysia Penna, MD

## 2018-07-10 ENCOUNTER — Ambulatory Visit (INDEPENDENT_AMBULATORY_CARE_PROVIDER_SITE_OTHER): Payer: Managed Care, Other (non HMO) | Admitting: Endocrinology

## 2018-07-10 ENCOUNTER — Encounter: Payer: Self-pay | Admitting: Endocrinology

## 2018-07-10 VITALS — BP 142/86 | HR 63 | Ht 70.0 in | Wt 253.0 lb

## 2018-07-10 DIAGNOSIS — E1122 Type 2 diabetes mellitus with diabetic chronic kidney disease: Secondary | ICD-10-CM

## 2018-07-10 DIAGNOSIS — N182 Chronic kidney disease, stage 2 (mild): Secondary | ICD-10-CM

## 2018-07-10 LAB — POCT GLYCOSYLATED HEMOGLOBIN (HGB A1C): HEMOGLOBIN A1C: 7.7 % — AB (ref 4.0–5.6)

## 2018-07-10 MED ORDER — CANAGLIFLOZIN 100 MG PO TABS: 200.0000 mg | ORAL_TABLET | Freq: Every day | ORAL | 11 refills | Status: DC

## 2018-07-10 NOTE — Patient Instructions (Signed)
I have sent a prescription to your pharmacy, to increase the invokana to 2 pills per day. Please continue the same other diabetes medications. check your blood sugar once a day.  vary the time of day when you check, between before the 3 meals, and at bedtime.  also check if you have symptoms of your blood sugar being too high or too low.  please keep a record of the readings and bring it to your next appointment here (or you can bring the meter itself).  You can write it on any piece of paper.  please call us sooner if your blood sugar goes below 70, or if you have a lot of readings over 200.   Please come back for a follow-up appointment in 3 months.

## 2018-07-10 NOTE — Progress Notes (Signed)
Subjective:    Patient ID: Bryan Haley, male    DOB: 08-23-1951, 67 y.o.   MRN: [id number]  HPI Pt returns for f/u of diabetes mellitus: DM type: 2 Dx'ed: 2001.  Complications: renal insufficiency and CAD.  Therapy: ozempic and 4 oral meds.   DKA: never.  Severe hypoglycemia: never.   Pancreatitis: never.   Other: insurance declines weight-loss surgery; he took insulin 2009-2016; oral rx options are limited by renal insuff and edema.   Interval history:  no cbg record, but states cbg's are approx 200.  pt states he feels well in general.  Past Medical History:  Diagnosis Date  . COLONIC POLYPS, HX OF 10/02/2008  . Coronary artery disease   . Coronary atherosclerosis of native coronary artery 12/05/2013   Prox RCA, and PLA DES 12/04/13.   . Diabetic retinopathy of both eyes ([place])    Laser surgery, Dr. Zadie Rhine  . 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 Exertional shortness of breath    "last 2-3 yrs" (12/04/2013)  . GOUT 07/11/2007  . Hyperlipidemia   . HYPERTENSION 07/06/2007  . HYPOGONADISM 06/11/2010  . OSA (obstructive sleep apnea) 10/08/2009   wears CPAP "more than 1/2 the time"  . Other and unspecified angina pectoris   . RENAL INSUFFICIENCY 12/11/2008    Past Surgical History:  Procedure Laterality Date  . CARPAL TUNNEL RELEASE Left ~ 2004  . COLONOSCOPY    . CORONARY ANGIOPLASTY WITH STENT PLACEMENT  12/04/2013   "2 stents"  . EYE EXAMINATION UNDER ANESTHESIA W/ RETINAL CRYOTHERAPY AND RETINAL LASER Bilateral 2005-2010   "for my diabetes" (12/04/2013)  . LEFT HEART CATHETERIZATION WITH CORONARY ANGIOGRAM Bilateral 12/04/2013   Procedure: LEFT HEART CATHETERIZATION WITH CORONARY ANGIOGRAM;  Surgeon: Candee Furbish, MD;  Location: [facility] CATH LAB;  Service: Cardiovascular;  Laterality: Bilateral;    Social History   Socioeconomic History  . Marital status: Married    Spouse name: Not on file  . Number of children: Not on file    . Years of education: Not on file  . Highest education level: Not on file  Occupational History  . Occupation: Geographical information systems officer  . Financial resource strain: Not on file  . Food insecurity:    Worry: Not on file    Inability: Not on file  . Transportation needs:    Medical: Not on file    Non-medical: Not on file  Tobacco Use  . Smoking status: Former Smoker    Packs/day: 1.50    Years: 45.00    Pack years: 67.50    Types: Cigarettes    Last attempt to quit: 10/02/2010    Years since quitting: 7.7  . Smokeless tobacco: Never Used  Substance and Sexual Activity  . Alcohol use: Yes    Alcohol/week: 1.0 standard drinks    Types: 1 Shots of liquor per week  . Drug use: No  . Sexual activity: Yes  Lifestyle  . Physical activity:    Days per week: Not on file    Minutes per session: Not on file  . Stress: Not on file  Relationships  . Social connections:    Talks on phone: Not on file    Gets together: Not on file    Attends religious service: Not on file    Active member of club or organization: Not on file    Attends meetings of clubs or organizations: Not on  file    Relationship status: Not on file  . Intimate partner violence:    Fear of current or ex partner: Not on file    Emotionally abused: Not on file    Physically abused: Not on file    Forced sexual activity: Not on file  Other Topics Concern  . Not on file  Social History Narrative   Regular exercise-no   Pt went to seminar to quit smoking    Current Outpatient Medications on File Prior to Visit  Medication Sig Dispense Refill  . acarbose (PRECOSE) 50 MG tablet Take 1 tablet (50 mg total) by mouth 3 (three) times daily with meals. 270 tablet 3  . allopurinol (ZYLOPRIM) 300 MG tablet Take 1 tablet (300 mg total) by mouth daily. 100 tablet 4  . amLODipine (NORVASC) 10 MG tablet Take 1 tablet (10 mg total) by mouth daily. 100 tablet 3  . aspirin 81 MG chewable tablet Chew 1 tablet (81 mg total) by mouth  daily.    . bromocriptine (PARLODEL) 5 MG capsule TAKE 1 CAPSULE BY MOUTH EVERY DAY 90 capsule 1  . doxazosin (CARDURA) 8 MG tablet Take 1 tablet (8 mg total) by mouth at bedtime. 100 tablet 3  . fluticasone (FLONASE) 50 MCG/ACT nasal spray PLACE 1 SPRAY INTO BOTH NOSTRILS DAILY 32 g 10  . glucose blood (ONE TOUCH ULTRA TEST) test strip Use to test blood sugar twice daily. Dx: E10.22 100 each 2  . losartan (COZAAR) 100 MG tablet Take 1 tablet (100 mg total) by mouth 2 (two) times daily. 200 tablet 4  . ONE TOUCH ULTRA TEST test strip USE TO TEST BLOOD USGAR TWICE A DAY 100 each 3  . OZEMPIC 1 MG/DOSE SOPN INJECT 1 MG INTO THE SKIN ONCE A WEEK. 12 pen 3  . repaglinide (PRANDIN) 2 MG tablet TAKE 2 TABLETS (4 MG TOTAL) BY MOUTH 3 (THREE) TIMES DAILY BEFORE MEALS. 540 tablet 1  . simvastatin (ZOCOR) 40 MG tablet Take 1 tablet (40 mg total) by mouth at bedtime. 100 tablet 4  . Testosterone 20.25 MG/ACT (1.62%) GEL APPLY 5 PUMPS ONTO SKIN EVERY DAY 150 g 0  . traMADol (ULTRAM) 50 MG tablet TAKE 1/2 TAB BY MOUTH TWICE A DAY 100 tablet 1   No current facility-administered medications on file prior to visit.     No Known Allergies  Family History  Problem Relation Age of Onset  . Cancer Mother   . Heart disease Father   . Heart attack Father   . Diabetes Other   . Hyperlipidemia Other   . Hypertension Other   . Colon cancer Neg Hx   . Esophageal cancer Neg Hx   . Stomach cancer Neg Hx   . Rectal cancer Neg Hx   . Stroke Neg Hx     BP (!) 142/86   Pulse 63   Ht 5\' 10"  (1.778 m)   Wt 253 lb (114.8 kg)   BMI 36.30 kg/m    Review of Systems He denies hypoglycemia    Objective:   Physical Exam VITAL SIGNS:  See vs page.   GENERAL: no distress.  Pulses: dorsalis pedis intact bilat.   MSK: no deformity of the feet.  CV: trace bilat leg edema.   Skin:  no ulcer on the feet.  normal color and temp on the feet.   Neuro: sensation is intact to touch on the feet.   Lab Results    Component Value Date   CREATININE  1.81 (H) 12/12/2017   BUN 24 (H) 12/12/2017   NA 139 12/12/2017   K 4.5 12/12/2017   CL 103 12/12/2017   CO2 29 12/12/2017   A1c=7.7%     Assessment & Plan:  Type 2 DM, with CAD: Worse Renal insuff: we have to increase invokana cautiously.  Edema: this limits rx options  Patient Instructions  I have sent a prescription to your pharmacy, to increase the invokana to 2 pills per day. Please continue the same other diabetes medications. check your blood sugar once a day.  vary the time of day when you check, between before the 3 meals, and at bedtime.  also check if you have symptoms of your blood sugar being too high or too low.  please keep a record of the readings and bring it to your next appointment here (or you can bring the meter itself).  You can write it on any piece of paper.  please call us sooner if your blood sugar goes below 70, or if you have a lot of readings over 200.   Please come back for a follow-up appointment in 3 months.

## 2018-07-25 LAB — HM DIABETES EYE EXAM

## 2018-07-26 ENCOUNTER — Other Ambulatory Visit: Payer: Self-pay | Admitting: Emergency Medicine

## 2018-07-26 ENCOUNTER — Other Ambulatory Visit: Payer: Self-pay | Admitting: Endocrinology

## 2018-07-26 MED ORDER — CANAGLIFLOZIN 300 MG PO TABS: 300.0000 mg | ORAL_TABLET | Freq: Every day | ORAL | 11 refills | Status: DC

## 2018-07-26 MED ORDER — GLUCOSE BLOOD VI STRP: ORAL_STRIP | 3 refills | Status: AC

## 2018-08-24 ENCOUNTER — Encounter: Payer: Self-pay | Admitting: Family Medicine

## 2018-09-20 ENCOUNTER — Encounter: Payer: Self-pay | Admitting: Gastroenterology

## 2018-09-25 ENCOUNTER — Encounter: Payer: Self-pay | Admitting: Family Medicine

## 2018-09-25 ENCOUNTER — Ambulatory Visit (INDEPENDENT_AMBULATORY_CARE_PROVIDER_SITE_OTHER): Payer: Managed Care, Other (non HMO) | Admitting: Family Medicine

## 2018-09-25 VITALS — BP 138/82 | HR 63 | Temp 98.6°F | Ht 70.0 in | Wt 248.0 lb

## 2018-09-25 DIAGNOSIS — E785 Hyperlipidemia, unspecified: Secondary | ICD-10-CM

## 2018-09-25 DIAGNOSIS — I10 Essential (primary) hypertension: Secondary | ICD-10-CM

## 2018-09-25 DIAGNOSIS — E1159 Type 2 diabetes mellitus with other circulatory complications: Secondary | ICD-10-CM

## 2018-09-25 DIAGNOSIS — E1169 Type 2 diabetes mellitus with other specified complication: Secondary | ICD-10-CM

## 2018-09-25 DIAGNOSIS — N182 Chronic kidney disease, stage 2 (mild): Secondary | ICD-10-CM

## 2018-09-25 DIAGNOSIS — M1A1790 Lead-induced chronic gout, unspecified ankle and foot, without tophus (tophi): Secondary | ICD-10-CM

## 2018-09-25 DIAGNOSIS — R011 Cardiac murmur, unspecified: Secondary | ICD-10-CM | POA: Insufficient documentation

## 2018-09-25 DIAGNOSIS — T560X1D Toxic effect of lead and its compounds, accidental (unintentional), subsequent encounter: Secondary | ICD-10-CM | POA: Diagnosis not present

## 2018-09-25 DIAGNOSIS — N183 Chronic kidney disease, stage 3 (moderate): Secondary | ICD-10-CM

## 2018-09-25 DIAGNOSIS — E1122 Type 2 diabetes mellitus with diabetic chronic kidney disease: Secondary | ICD-10-CM

## 2018-09-25 NOTE — Assessment & Plan Note (Signed)
Follows up with endocrinology next month.  Continue current regimen.

## 2018-09-25 NOTE — Progress Notes (Signed)
   Subjective:  Bryan Haley is a 67 y.o. male who presents today with a chief complaint of HTN and to transfer care to this office.   HPI:  HTN Currently on norvasc 10mg  and losartan 100mg  daily. Tolerating well.   HLD Currently on simvastatin 40 mg daily tolerating well.  T2DM Follows with endocrinology.  Current regimen includes acarbose 50 mg daily, bromocriptine 5 mg daily, Invokana 300 mg daily, Ozempic 1 mg weekly, and Prandin 2 mg daily.  Gout No flares in 4 to 5 years.  Currently on allopurinol 300 mg daily tolerating well.  Arthritis Takes 25 mg of tramadol daily as needed.  ROS: Per HPI  PMH: He reports that he quit smoking about 7 years ago. His smoking use included cigarettes. He has a 67.50 pack-year smoking history. He has never used smokeless tobacco. He reports that he drinks about 1.0 standard drinks of alcohol per week. He reports that he does not use drugs.  Objective:  Physical Exam: BP 138/82 (BP Location: Left Arm, Patient Position: Sitting, Cuff Size: Normal)   Pulse 63   Temp 98.6 F (37 C) (Oral)   Ht 5\' 10"  (1.778 m)   Wt 248 lb (112.5 kg)   SpO2 96%   BMI 35.58 kg/m   Gen: NAD, resting comfortably CV: RRR with soft 2/6 systolic murmur appreciated Pulm: NWOB, CTAB with no crackles, wheezes, or rhonchi  Assessment/Plan:  Diabetes ([place]) Follows up with endocrinology next month.  Continue current regimen.  Systolic murmur Noted on exam.  Denies any chest pain, shortness of breath or presyncope.  Continue with watchful waiting.  Gout Stable.  Continue allopurinol 300 mg daily.  Hypertension associated with diabetes ([place]) At goal.  Continue Norvasc 10 mg daily and losartan 100 mg daily.  Dyslipidemia associated with type 2 diabetes mellitus ([facility]) Continue simvastatin 40 mg daily.  Check lipid panel with next blood draw.  CKD stage 3 due to type 2 diabetes mellitus ([facility]) Stable.  Check BMET with next blood draw.  Preventative  Healthcare Patient was instructed to return soon for CPE. Health Maintenance Due  Topic Date Due  . Hepatitis C Screening  1951/08/03   Caleb M. Jerline Pain, MD 09/25/2018 9:13 AM

## 2018-09-25 NOTE — Assessment & Plan Note (Signed)
At goal.  Continue Norvasc 10 mg daily and losartan 100 mg daily.

## 2018-09-25 NOTE — Patient Instructions (Signed)
It was very nice to see you today!  No medication changes today!  Keep up the good work.  Come back to see me in 3 months for your annual physical, or sooner as needed.   Take care, Dr Jerline Pain

## 2018-09-25 NOTE — Assessment & Plan Note (Signed)
Stable.  Check BMET with next blood draw.

## 2018-09-25 NOTE — Assessment & Plan Note (Signed)
Continue simvastatin 40 mg daily.  Check lipid panel with next blood draw.

## 2018-09-25 NOTE — Assessment & Plan Note (Signed)
Noted on exam.  Denies any chest pain, shortness of breath or presyncope.  Continue with watchful waiting.

## 2018-09-25 NOTE — Assessment & Plan Note (Signed)
Stable.  Continue allopurinol 300 mg daily.

## 2018-10-10 ENCOUNTER — Encounter: Payer: Self-pay | Admitting: Endocrinology

## 2018-10-10 ENCOUNTER — Ambulatory Visit (INDEPENDENT_AMBULATORY_CARE_PROVIDER_SITE_OTHER): Payer: Managed Care, Other (non HMO) | Admitting: Endocrinology

## 2018-10-10 VITALS — BP 130/70 | HR 65 | Ht 70.0 in | Wt 243.4 lb

## 2018-10-10 DIAGNOSIS — E1122 Type 2 diabetes mellitus with diabetic chronic kidney disease: Secondary | ICD-10-CM

## 2018-10-10 DIAGNOSIS — N182 Chronic kidney disease, stage 2 (mild): Secondary | ICD-10-CM

## 2018-10-10 LAB — POCT GLYCOSYLATED HEMOGLOBIN (HGB A1C): HEMOGLOBIN A1C: 7 % — AB (ref 4.0–5.6)

## 2018-10-10 NOTE — Patient Instructions (Addendum)
Please continue the same medications  °check your blood sugar once a day.  vary the time of day when you check, between before the 3 meals, and at bedtime.  also check if you have symptoms of your blood sugar being too high or too low.  please keep a record of the readings and bring it to your next appointment here (or you can bring the meter itself).  You can write it on any piece of paper.  please call us sooner if your blood sugar goes below 70, or if you have a lot of readings over 200. °Please come back for a follow-up appointment in 3-4 months.  °

## 2018-10-10 NOTE — Progress Notes (Signed)
Subjective:    Patient ID: Bryan Haley, male    DOB: Apr 26, 1951, 67 y.o.   MRN: [id number]  HPI Pt returns for f/u of diabetes mellitus: DM type: 2 Dx'ed: 2001.  Complications: renal insufficiency and CAD.  Therapy: ozempic and 4 oral meds.   DKA: never.  Severe hypoglycemia: never.   Pancreatitis: never.   Other: insurance declines weight-loss surgery; he took insulin 2009-2016; oral rx options are limited by renal insuff and edema.   Interval history:  no cbg record, but states cbg's vary from 160-200.  pt states he feels well in general.   Past Medical History:  Diagnosis Date  . COLONIC POLYPS, HX OF 10/02/2008  . Coronary artery disease   . Coronary atherosclerosis of native coronary artery 12/05/2013   Prox RCA, and PLA DES 12/04/13.   . Diabetic retinopathy of both eyes ([place])    Laser surgery, Dr. Zadie Rhine  . 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 Exertional shortness of breath    "last 2-3 yrs" (12/04/2013)  . GOUT 07/11/2007  . Hyperlipidemia   . HYPERTENSION 07/06/2007  . HYPOGONADISM 06/11/2010  . OSA (obstructive sleep apnea) 10/08/2009   wears CPAP "more than 1/2 the time"  . Other and unspecified angina pectoris   . RENAL INSUFFICIENCY 12/11/2008    Past Surgical History:  Procedure Laterality Date  . CARPAL TUNNEL RELEASE Left ~ 2004  . COLONOSCOPY    . CORONARY ANGIOPLASTY WITH STENT PLACEMENT  12/04/2013   "2 stents"  . EYE EXAMINATION UNDER ANESTHESIA W/ RETINAL CRYOTHERAPY AND RETINAL LASER Bilateral 2005-2010   "for my diabetes" (12/04/2013)  . LEFT HEART CATHETERIZATION WITH CORONARY ANGIOGRAM Bilateral 12/04/2013   Procedure: LEFT HEART CATHETERIZATION WITH CORONARY ANGIOGRAM;  Surgeon: Candee Furbish, MD;  Location: [facility] CATH LAB;  Service: Cardiovascular;  Laterality: Bilateral;    Social History   Socioeconomic History  . Marital status: Married    Spouse name: Not on file  . Number of children: Not on  file  . Years of education: Not on file  . Highest education level: Not on file  Occupational History  . Occupation: Geographical information systems officer  . Financial resource strain: Not on file  . Food insecurity:    Worry: Not on file    Inability: Not on file  . Transportation needs:    Medical: Not on file    Non-medical: Not on file  Tobacco Use  . Smoking status: Former Smoker    Packs/day: 1.50    Years: 45.00    Pack years: 67.50    Types: Cigarettes    Last attempt to quit: 10/02/2010    Years since quitting: 8.0  . Smokeless tobacco: Never Used  Substance and Sexual Activity  . Alcohol use: Yes    Alcohol/week: 1.0 standard drinks    Types: 1 Shots of liquor per week  . Drug use: No  . Sexual activity: Yes  Lifestyle  . Physical activity:    Days per week: Not on file    Minutes per session: Not on file  . Stress: Not on file  Relationships  . Social connections:    Talks on phone: Not on file    Gets together: Not on file    Attends religious service: Not on file    Active member of club or organization: Not on file    Attends meetings of clubs or organizations: Not on  file    Relationship status: Not on file  . Intimate partner violence:    Fear of current or ex partner: Not on file    Emotionally abused: Not on file    Physically abused: Not on file    Forced sexual activity: Not on file  Other Topics Concern  . Not on file  Social History Narrative   Regular exercise-no   Pt went to seminar to quit smoking    Current Outpatient Medications on File Prior to Visit  Medication Sig Dispense Refill  . acarbose (PRECOSE) 50 MG tablet Take 1 tablet (50 mg total) by mouth 3 (three) times daily with meals. 270 tablet 3  . allopurinol (ZYLOPRIM) 300 MG tablet Take 1 tablet (300 mg total) by mouth daily. 100 tablet 4  . amLODipine (NORVASC) 10 MG tablet Take 1 tablet (10 mg total) by mouth daily. 100 tablet 3  . aspirin 81 MG chewable tablet Chew 1 tablet (81 mg total) by  mouth daily.    . bromocriptine (PARLODEL) 5 MG capsule TAKE 1 CAPSULE BY MOUTH EVERY DAY 90 capsule 1  . canagliflozin (INVOKANA) 300 MG TABS tablet Take 1 tablet (300 mg total) by mouth daily before breakfast. 30 tablet 11  . doxazosin (CARDURA) 8 MG tablet Take 1 tablet (8 mg total) by mouth at bedtime. 100 tablet 3  . fluticasone (FLONASE) 50 MCG/ACT nasal spray PLACE 1 SPRAY INTO BOTH NOSTRILS DAILY 32 g 10  . glucose blood (ONE TOUCH ULTRA TEST) test strip USE TO TEST BLOOD USGAR TWICE A DAY 100 each 3  . losartan (COZAAR) 100 MG tablet Take 1 tablet (100 mg total) by mouth 2 (two) times daily. 200 tablet 4  . OZEMPIC 1 MG/DOSE SOPN INJECT 1 MG INTO THE SKIN ONCE A WEEK. 12 pen 3  . repaglinide (PRANDIN) 2 MG tablet TAKE 2 TABLETS (4 MG TOTAL) BY MOUTH 3 (THREE) TIMES DAILY BEFORE MEALS. 540 tablet 1  . simvastatin (ZOCOR) 40 MG tablet Take 1 tablet (40 mg total) by mouth at bedtime. 100 tablet 4  . traMADol (ULTRAM) 50 MG tablet TAKE 1/2 TAB BY MOUTH TWICE A DAY 100 tablet 1   No current facility-administered medications on file prior to visit.     No Known Allergies  Family History  Problem Relation Age of Onset  . Cancer Mother   . Heart disease Father   . Heart attack Father   . Diabetes Other   . Hyperlipidemia Other   . Hypertension Other   . Rectal cancer Sister   . Colon cancer Neg Hx   . Esophageal cancer Neg Hx   . Stomach cancer Neg Hx   . Stroke Neg Hx     BP 130/70 (BP Location: Left Arm, Patient Position: Sitting, Cuff Size: Large)   Pulse 65   Ht 5\' 10"  (1.778 m)   Wt 243 lb 6.4 oz (110.4 kg)   SpO2 96%   BMI 34.92 kg/m    Review of Systems He denies hypoglycemia.      Objective:   Physical Exam VITAL SIGNS:  See vs page GENERAL: no distress Pulses: foot pulses are intact bilaterally.   MSK: no deformity of the feet or ankles.  CV: no edema of the legs or ankles.  Skin:  no ulcer on the feet or ankles.  normal color and temp on the feet and  ankles.   Neuro: sensation is intact to touch on the feet and ankles.  A1c=7.0%     Assessment & Plan:  Type 2 DM, with CAD: well-controlled Renal insuff: this limits rx options.  Patient Instructions  Please continue the same medications.   check your blood sugar once a day.  vary the time of day when you check, between before the 3 meals, and at bedtime.  also check if you have symptoms of your blood sugar being too high or too low.  please keep a record of the readings and bring it to your next appointment here (or you can bring the meter itself).  You can write it on any piece of paper.  please call us sooner if your blood sugar goes below 70, or if you have a lot of readings over 200.   Please come back for a follow-up appointment in 3-4 months.

## 2018-10-15 ENCOUNTER — Other Ambulatory Visit: Payer: Self-pay | Admitting: Family Medicine

## 2018-10-15 DIAGNOSIS — E7800 Pure hypercholesterolemia, unspecified: Secondary | ICD-10-CM

## 2018-10-15 NOTE — Telephone Encounter (Signed)
Copied from [place] [id number]. Topic: Quick Communication - Rx Refill/Question >> Oct 15, 2018 12:12 PM Judyann Munson wrote: Medication:traMADol Veatrice Bourbon) 50 MG tablet  Has the patient contacted their pharmacy? Yes Preferred Pharmacy (with phone number or street name): [place], [place] - [address] [phone] (Phone) [phone] (Fax)    Agent: Please be advised that RX refills may take up to 3 business days. We ask that you follow-up with your pharmacy.

## 2018-10-16 MED ORDER — TRAMADOL HCL 50 MG PO TABS: ORAL_TABLET | ORAL | 1 refills | Status: DC

## 2018-10-16 NOTE — Telephone Encounter (Signed)
Rx sent in.  Algis Greenhouse. Jerline Pain, MD 10/16/2018 4:44 PM

## 2018-10-16 NOTE — Telephone Encounter (Signed)
Please advise 

## 2018-10-16 NOTE — Telephone Encounter (Signed)
Requested medication (s) are due for refill today: Yes  Requested medication (s) are on the active medication list: Yes  Last refill:  12/12/17  Future visit scheduled: Yes  Notes to clinic:  Unable to refill per protocol, narcotic.     Requested Prescriptions  Pending Prescriptions Disp Refills   traMADol (ULTRAM) 50 MG tablet 100 tablet 1    Sig: TAKE 1/2 TAB BY MOUTH TWICE A DAY     Not Delegated - Analgesics:  Opioid Agonists Failed - 10/16/2018 12:06 PM      Failed - This refill cannot be delegated      Failed - Urine Drug Screen completed in last 360 days.      Passed - Valid encounter within last 6 months    Recent Outpatient Visits          3 weeks ago Hypertension associated with diabetes [facility])   [facility], Algis Greenhouse, MD   3 months ago Sebaceous cyst   [place] [facility] [employer], Ishmael Holter, MD   10 months ago Essential hypertension   [facility] [employer], Jory Ee, MD   1 year ago Disorder resulting from impaired renal function   [facility] [employer], Jory Ee, MD   1 year ago Essential hypertension   [facility] at [place], MD      Future Appointments            In 1 month Jerline Pain, Algis Greenhouse, MD [place], [facility]

## 2018-10-23 ENCOUNTER — Telehealth: Payer: Self-pay | Admitting: *Deleted

## 2018-10-23 NOTE — Telephone Encounter (Signed)
Attempted PA, and it is not required for this medication.  Medication is covered by patient's insurance.

## 2018-10-23 NOTE — Telephone Encounter (Signed)
Copied from [place] [id number]. Topic: General - Other >> Oct 23, 2018  3:06 PM Yvette Rack wrote: Reason for CRM: Pt stated he contacted the pharmacy for a refill on the traMADol (ULTRAM) 50 MG tablet but he was told he needed to contact his insurance. Pt stated after contacting his insurance company he was told to have his primary care provider contact the PA dept at [phone].

## 2018-10-24 NOTE — Telephone Encounter (Signed)
LM for patient to return call.  CRM placed.

## 2018-10-30 ENCOUNTER — Ambulatory Visit (AMBULATORY_SURGERY_CENTER): Payer: Self-pay | Admitting: *Deleted

## 2018-10-30 ENCOUNTER — Encounter: Payer: Self-pay | Admitting: Gastroenterology

## 2018-10-30 VITALS — Ht 70.0 in | Wt 251.0 lb

## 2018-10-30 DIAGNOSIS — Z86010 Personal history of colonic polyps: Secondary | ICD-10-CM

## 2018-10-30 MED ORDER — NA SULFATE-K SULFATE-MG SULF 17.5-3.13-1.6 GM/177ML PO SOLN: 1.0000 | Freq: Once | ORAL | 0 refills | Status: AC

## 2018-10-30 NOTE — Progress Notes (Signed)
No egg or soy allergy known to patient  No issues with past sedation with any surgeries  or procedures, no intubation problems  No diet pills per patient No home 02 use per patient  No blood thinners per patient  Pt denies issues with constipation  No A fib or A flutter  EMMI video sent to pt's e mail - pt declined video  Suprep $15 coupon to pt

## 2018-11-13 ENCOUNTER — Ambulatory Visit (AMBULATORY_SURGERY_CENTER): Payer: Managed Care, Other (non HMO) | Admitting: Gastroenterology

## 2018-11-13 ENCOUNTER — Other Ambulatory Visit: Payer: Self-pay | Admitting: Endocrinology

## 2018-11-13 ENCOUNTER — Inpatient Hospital Stay (HOSPITAL_COMMUNITY)
Admission: EM | Admit: 2018-11-13 | Discharge: 2018-11-16 | DRG: 920 | Disposition: A | Discharge status: Home / Self Care | Payer: Managed Care, Other (non HMO) | Attending: Family Medicine | Admitting: Family Medicine

## 2018-11-13 ENCOUNTER — Telehealth: Payer: Self-pay | Admitting: Gastroenterology

## 2018-11-13 ENCOUNTER — Encounter (HOSPITAL_COMMUNITY): Payer: Self-pay | Admitting: *Deleted

## 2018-11-13 ENCOUNTER — Ambulatory Visit: Payer: Managed Care, Other (non HMO) | Admitting: Cardiology

## 2018-11-13 ENCOUNTER — Encounter: Payer: Self-pay | Admitting: Gastroenterology

## 2018-11-13 ENCOUNTER — Other Ambulatory Visit: Payer: Self-pay

## 2018-11-13 VITALS — BP 150/68 | HR 55 | Temp 92.0°F | Resp 11 | Ht 70.0 in | Wt 251.0 lb

## 2018-11-13 DIAGNOSIS — Z9989 Dependence on other enabling machines and devices: Secondary | ICD-10-CM | POA: Diagnosis not present

## 2018-11-13 DIAGNOSIS — Z7982 Long term (current) use of aspirin: Secondary | ICD-10-CM

## 2018-11-13 DIAGNOSIS — I10 Essential (primary) hypertension: Secondary | ICD-10-CM | POA: Diagnosis not present

## 2018-11-13 DIAGNOSIS — K5730 Diverticulosis of large intestine without perforation or abscess without bleeding: Secondary | ICD-10-CM | POA: Diagnosis present

## 2018-11-13 DIAGNOSIS — D123 Benign neoplasm of transverse colon: Secondary | ICD-10-CM | POA: Diagnosis not present

## 2018-11-13 DIAGNOSIS — I2510 Atherosclerotic heart disease of native coronary artery without angina pectoris: Secondary | ICD-10-CM | POA: Diagnosis present

## 2018-11-13 DIAGNOSIS — Z794 Long term (current) use of insulin: Secondary | ICD-10-CM | POA: Diagnosis not present

## 2018-11-13 DIAGNOSIS — E1122 Type 2 diabetes mellitus with diabetic chronic kidney disease: Secondary | ICD-10-CM | POA: Diagnosis present

## 2018-11-13 DIAGNOSIS — I959 Hypotension, unspecified: Secondary | ICD-10-CM | POA: Diagnosis present

## 2018-11-13 DIAGNOSIS — G4733 Obstructive sleep apnea (adult) (pediatric): Secondary | ICD-10-CM | POA: Diagnosis present

## 2018-11-13 DIAGNOSIS — N183 Chronic kidney disease, stage 3 (moderate): Secondary | ICD-10-CM | POA: Diagnosis present

## 2018-11-13 DIAGNOSIS — D122 Benign neoplasm of ascending colon: Secondary | ICD-10-CM | POA: Diagnosis not present

## 2018-11-13 DIAGNOSIS — D500 Iron deficiency anemia secondary to blood loss (chronic): Secondary | ICD-10-CM | POA: Diagnosis not present

## 2018-11-13 DIAGNOSIS — Z79899 Other long term (current) drug therapy: Secondary | ICD-10-CM | POA: Diagnosis not present

## 2018-11-13 DIAGNOSIS — N179 Acute kidney failure, unspecified: Secondary | ICD-10-CM | POA: Diagnosis present

## 2018-11-13 DIAGNOSIS — Q438 Other specified congenital malformations of intestine: Secondary | ICD-10-CM | POA: Diagnosis not present

## 2018-11-13 DIAGNOSIS — D124 Benign neoplasm of descending colon: Secondary | ICD-10-CM

## 2018-11-13 DIAGNOSIS — Z955 Presence of coronary angioplasty implant and graft: Secondary | ICD-10-CM

## 2018-11-13 DIAGNOSIS — I129 Hypertensive chronic kidney disease with stage 1 through stage 4 chronic kidney disease, or unspecified chronic kidney disease: Secondary | ICD-10-CM | POA: Diagnosis present

## 2018-11-13 DIAGNOSIS — D62 Acute posthemorrhagic anemia: Secondary | ICD-10-CM | POA: Diagnosis present

## 2018-11-13 DIAGNOSIS — Y838 Other surgical procedures as the cause of abnormal reaction of the patient, or of later complication, without mention of misadventure at the time of the procedure: Secondary | ICD-10-CM | POA: Diagnosis present

## 2018-11-13 DIAGNOSIS — K91840 Postprocedural hemorrhage and hematoma of a digestive system organ or structure following a digestive system procedure: Principal | ICD-10-CM | POA: Diagnosis present

## 2018-11-13 DIAGNOSIS — E1169 Type 2 diabetes mellitus with other specified complication: Secondary | ICD-10-CM | POA: Diagnosis present

## 2018-11-13 DIAGNOSIS — K640 First degree hemorrhoids: Secondary | ICD-10-CM | POA: Diagnosis present

## 2018-11-13 DIAGNOSIS — E1022 Type 1 diabetes mellitus with diabetic chronic kidney disease: Secondary | ICD-10-CM | POA: Diagnosis present

## 2018-11-13 DIAGNOSIS — K921 Melena: Secondary | ICD-10-CM | POA: Diagnosis present

## 2018-11-13 DIAGNOSIS — E785 Hyperlipidemia, unspecified: Secondary | ICD-10-CM | POA: Diagnosis present

## 2018-11-13 DIAGNOSIS — K633 Ulcer of intestine: Secondary | ICD-10-CM | POA: Diagnosis present

## 2018-11-13 DIAGNOSIS — M109 Gout, unspecified: Secondary | ICD-10-CM | POA: Diagnosis present

## 2018-11-13 DIAGNOSIS — Z87891 Personal history of nicotine dependence: Secondary | ICD-10-CM

## 2018-11-13 DIAGNOSIS — Z860100 Personal history of colon polyps, unspecified: Secondary | ICD-10-CM

## 2018-11-13 DIAGNOSIS — N182 Chronic kidney disease, stage 2 (mild): Secondary | ICD-10-CM

## 2018-11-13 DIAGNOSIS — E1069 Type 1 diabetes mellitus with other specified complication: Secondary | ICD-10-CM | POA: Diagnosis present

## 2018-11-13 DIAGNOSIS — E1159 Type 2 diabetes mellitus with other circulatory complications: Secondary | ICD-10-CM | POA: Diagnosis not present

## 2018-11-13 DIAGNOSIS — D120 Benign neoplasm of cecum: Secondary | ICD-10-CM | POA: Diagnosis not present

## 2018-11-13 DIAGNOSIS — E10319 Type 1 diabetes mellitus with unspecified diabetic retinopathy without macular edema: Secondary | ICD-10-CM | POA: Diagnosis present

## 2018-11-13 DIAGNOSIS — E119 Type 2 diabetes mellitus without complications: Secondary | ICD-10-CM

## 2018-11-13 DIAGNOSIS — I441 Atrioventricular block, second degree: Secondary | ICD-10-CM | POA: Diagnosis present

## 2018-11-13 DIAGNOSIS — K922 Gastrointestinal hemorrhage, unspecified: Secondary | ICD-10-CM | POA: Diagnosis present

## 2018-11-13 LAB — COMPREHENSIVE METABOLIC PANEL
ALT: 22 U/L (ref 0–44)
AST: 27 U/L (ref 15–41)
Albumin: 3.4 g/dL — ABNORMAL LOW (ref 3.5–5.0)
Alkaline Phosphatase: 64 U/L (ref 38–126)
Anion gap: 9 (ref 5–15)
BUN: 16 mg/dL (ref 8–23)
CO2: 20 mmol/L — ABNORMAL LOW (ref 22–32)
Calcium: 7.9 mg/dL — ABNORMAL LOW (ref 8.9–10.3)
Chloride: 111 mmol/L (ref 98–111)
Creatinine, Ser: 1.89 mg/dL — ABNORMAL HIGH (ref 0.61–1.24)
GFR calc Af Amer: 41 mL/min — ABNORMAL LOW (ref >60)
GFR, EST NON AFRICAN AMERICAN: 36 mL/min — AB (ref >60)
Glucose, Bld: 141 mg/dL — ABNORMAL HIGH (ref 70–99)
Potassium: 3.5 mmol/L (ref 3.5–5.1)
Sodium: 140 mmol/L (ref 135–145)
Total Bilirubin: 0.7 mg/dL (ref 0.3–1.2)
Total Protein: 5.5 g/dL — ABNORMAL LOW (ref 6.5–8.1)

## 2018-11-13 LAB — CBC
HCT: 37.2 % — ABNORMAL LOW (ref 39.0–52.0)
Hemoglobin: 12 g/dL — ABNORMAL LOW (ref 13.0–17.0)
MCH: 31.3 pg (ref 26.0–34.0)
MCHC: 32.3 g/dL (ref 30.0–36.0)
MCV: 96.9 fL (ref 80.0–100.0)
Platelets: 174 10*3/uL (ref 150–400)
RBC: 3.84 MIL/uL — ABNORMAL LOW (ref 4.22–5.81)
RDW: 14.2 % (ref 11.5–15.5)
WBC: 16.5 10*3/uL — AB (ref 4.0–10.5)
nRBC: 0 % (ref 0.0–0.2)

## 2018-11-13 MED ORDER — SODIUM CHLORIDE 0.9% FLUSH
3.0000 mL | Freq: Two times a day (BID) | INTRAVENOUS | Status: DC
  Administered 2018-11-14 – 2018-11-16 (×5): 3 mL via INTRAVENOUS

## 2018-11-13 MED ORDER — ONDANSETRON HCL 4 MG/2ML IJ SOLN: 4.0000 mg | Freq: Four times a day (QID) | INTRAMUSCULAR | Status: DC | PRN

## 2018-11-13 MED ORDER — SIMVASTATIN 40 MG PO TABS
40.0000 mg | ORAL_TABLET | Freq: Every day | ORAL | Status: DC
  Administered 2018-11-14: 40 mg via ORAL
  Filled 2018-11-13 (×2): qty 1

## 2018-11-13 MED ORDER — ACETAMINOPHEN 650 MG RE SUPP: 650.0000 mg | Freq: Four times a day (QID) | RECTAL | Status: DC | PRN

## 2018-11-13 MED ORDER — SODIUM CHLORIDE 0.9 % IV BOLUS
1000.0000 mL | Freq: Once | INTRAVENOUS | Status: AC
  Administered 2018-11-13: 1000 mL via INTRAVENOUS

## 2018-11-13 MED ORDER — ONDANSETRON HCL 4 MG PO TABS: 4.0000 mg | ORAL_TABLET | Freq: Four times a day (QID) | ORAL | Status: DC | PRN

## 2018-11-13 MED ORDER — ACETAMINOPHEN 325 MG PO TABS: 650.0000 mg | ORAL_TABLET | Freq: Four times a day (QID) | ORAL | Status: DC | PRN

## 2018-11-13 MED ORDER — SODIUM CHLORIDE 0.9 % IV SOLN: 500.0000 mL | Freq: Once | INTRAVENOUS | Status: DC

## 2018-11-13 MED ORDER — INSULIN ASPART 100 UNIT/ML ~~LOC~~ SOLN
0.0000 [IU] | SUBCUTANEOUS | Status: DC
  Administered 2018-11-14: 5 [IU] via SUBCUTANEOUS
  Administered 2018-11-14: 7 [IU] via SUBCUTANEOUS
  Administered 2018-11-14: 3 [IU] via SUBCUTANEOUS
  Administered 2018-11-14 – 2018-11-15 (×2): 2 [IU] via SUBCUTANEOUS
  Administered 2018-11-15 (×3): 1 [IU] via SUBCUTANEOUS
  Administered 2018-11-15: 2 [IU] via SUBCUTANEOUS
  Administered 2018-11-15: 3 [IU] via SUBCUTANEOUS
  Administered 2018-11-16 (×2): 2 [IU] via SUBCUTANEOUS
  Administered 2018-11-16 (×2): 1 [IU] via SUBCUTANEOUS
  Filled 2018-11-13 (×2): qty 1

## 2018-11-13 MED ORDER — SODIUM CHLORIDE 0.9 % IV SOLN
INTRAVENOUS | Status: AC
  Administered 2018-11-14: 01:00:00 via INTRAVENOUS

## 2018-11-13 NOTE — H&P (Addendum)
History and Physical    Bryan Haley DOB: May 15, 1951 DOA: 11/13/2018  PCP: Vivi Barrack, MD  Patient coming from: Home  I have personally briefly reviewed patient's old medical records in [place]  Chief Complaint: Hematochezia  HPI: Bryan Haley is a 67 y.o. male with medical history significant for CAD s/p DES, type 2 diabetes, hypertension, hyperlipidemia, CKD stage III, and OSA on CPAP, and history of colonic polyps who presented to the ED with hematochezia.  Patient underwent colonoscopy morning of admission 11/13/2018 for high risk colon cancer surveillance.  Per op note 33 colonic polyps were seen, 27 which were resected.  Left colon diverticulosis and internal hemorrhoids are also noted.  He did well postop and was discharged home.  This afternoon he developed 3 episodes of large-volume hematochezia associated with lightheadedness without syncope.  His wife called the gastroenterologist's office and was instructed to come to the emergency department.  He is on low-dose aspirin for history of CAD and reports taking it last this morning.  He denies any NSAID use.  He denies any chest pain, dyspnea, abdominal pain, fevers.  ED Course:  Initial vitals showed BP 122/60, pulse 70, RR 16, temp 98.27F, SPO2 95% on room air.  He had another large bloody bowel movement on arrival in the ED with a drop in blood pressure to 85/50s.  He was given 1 L normal saline with improvement.  Labs were notable for hemoglobin 12.0 (last value was 14.5 ago), hematocrit 37.2, platelets 174, WBC 16.5.  Creatinine 1.89 which is at his baseline.  Patient was typed and screened.  Gastroenterology, Dr. Ardis Hughs, was consulted who recommended admission and to make patient n.p.o. at midnight for colonoscopy tomorrow.  Hospital service was consulted for admission.  Review of Systems: As per HPI otherwise 10 point review of systems negative.    Past Medical History:  Diagnosis Date    . Allergy   . Arthritis    feet  . COLONIC POLYPS, HX OF 10/02/2008  . Coronary artery disease   . Coronary atherosclerosis of native coronary artery 12/05/2013   Prox RCA, and PLA DES 12/04/13.   . Diabetic retinopathy of both eyes ([place])    Laser surgery, Dr. Zadie Rhine  . 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 Exertional shortness of breath    "last 2-3 yrs" (12/04/2013)  . GOUT 07/11/2007  . Hyperlipidemia   . HYPERTENSION 07/06/2007  . HYPOGONADISM 06/11/2010  . OSA (obstructive sleep apnea) 10/08/2009   wears CPAP "more than 1/2 the time"  . Other and unspecified angina pectoris   . RENAL INSUFFICIENCY 12/11/2008  . Sleep apnea    no cpap     Past Surgical History:  Procedure Laterality Date  . CARPAL TUNNEL RELEASE Left ~ 2004  . COLONOSCOPY    . CORONARY ANGIOPLASTY WITH STENT PLACEMENT  12/04/2013   "2 stents"  . EYE EXAMINATION UNDER ANESTHESIA W/ RETINAL CRYOTHERAPY AND RETINAL LASER Bilateral 2005-2010   "for my diabetes" (12/04/2013)  . LEFT HEART CATHETERIZATION WITH CORONARY ANGIOGRAM Bilateral 12/04/2013   Procedure: LEFT HEART CATHETERIZATION WITH CORONARY ANGIOGRAM;  Surgeon: Candee Furbish, MD;  Location: [facility] CATH LAB;  Service: Cardiovascular;  Laterality: Bilateral;  . POLYPECTOMY       reports that he quit smoking about 8 years ago. His smoking use included cigarettes. He has a 67.50 pack-year smoking history. He has never used smokeless tobacco.  He reports current alcohol use of about 1.0 standard drinks of alcohol per week. He reports that he does not use drugs.  No Known Allergies  Family History  Problem Relation Age of Onset  . Cancer Mother   . Pancreatic cancer Mother   . Heart disease Father   . Heart attack Father   . Diabetes Other   . Hyperlipidemia Other   . Hypertension Other   . Rectal cancer Sister   . Colon cancer Neg Hx   . Esophageal cancer Neg Hx   . Stomach cancer Neg Hx   . Stroke Neg Hx   .  Colon polyps Neg Hx      Prior to Admission medications   Medication Sig Start Date End Date Taking? Authorizing Provider  acarbose (PRECOSE) 50 MG tablet Take 1 tablet (50 mg total) by mouth 3 (three) times daily with meals. 03/01/18  Yes Renato Shin, MD  allopurinol (ZYLOPRIM) 300 MG tablet Take 1 tablet (300 mg total) by mouth daily. 12/12/17  Yes Dorena Cookey, MD  amLODipine (NORVASC) 10 MG tablet Take 1 tablet (10 mg total) by mouth daily. 12/12/17  Yes Dorena Cookey, MD  aspirin 81 MG chewable tablet Chew 1 tablet (81 mg total) by mouth daily. 12/05/13  Yes Lyda Jester M, PA-C  bromocriptine (PARLODEL) 5 MG capsule TAKE 1 CAPSULE BY MOUTH EVERY DAY 07/01/18  Yes Renato Shin, MD  canagliflozin [facility]) 300 MG TABS tablet Take 1 tablet (300 mg total) by mouth daily before breakfast. 07/26/18  Yes Renato Shin, MD  doxazosin (CARDURA) 8 MG tablet Take 1 tablet (8 mg total) by mouth at bedtime. 12/12/17  Yes Dorena Cookey, MD  fluticasone [facility]) 50 MCG/ACT nasal spray PLACE 1 SPRAY INTO BOTH NOSTRILS DAILY 11/16/16  Yes Dorena Cookey, MD  losartan (COZAAR) 100 MG tablet Take 1 tablet (100 mg total) by mouth 2 (two) times daily. 12/12/17  Yes Dorena Cookey, MD  OZEMPIC 1 MG/DOSE SOPN INJECT 1 MG INTO THE SKIN ONCE A WEEK. Patient taking differently: Inject 1 mg into the skin once a week.  12/05/17  Yes Renato Shin, MD  repaglinide (PRANDIN) 2 MG tablet TAKE 2 TABLETS BY MOUTH THREE TIMES DAILY BEFORE MEAL(S) Patient taking differently: Take 2 mg by mouth 3 (three) times daily before meals.  11/13/18  Yes Renato Shin, MD  simvastatin (ZOCOR) 40 MG tablet Take 1 tablet (40 mg total) by mouth at bedtime. 12/12/17  Yes Dorena Cookey, MD  traMADol (ULTRAM) 50 MG tablet TAKE 1/2 TAB BY MOUTH TWICE A DAY 10/16/18  Yes Vivi Barrack, MD  glucose blood (ONE TOUCH ULTRA TEST) test strip USE TO TEST BLOOD USGAR TWICE A DAY 07/26/18   Renato Shin, MD    Physical Exam: Vitals:    11/13/18 2300 11/13/18 2330 11/13/18 2345 11/14/18 0050  BP:    (!) 79/44  Pulse: 91 88 73 81  Resp: (!) 22   16  Temp:      TempSrc:      SpO2: 100% 95% 98% 98%    Constitutional: Resting flat in bed, diaphoretic, calm, appears tired Eyes: PERRL, pale conjunctive ENMT: Mucous membranes are moist. Posterior pharynx clear of any exudate or lesions. Normal dentition.  Neck: normal, supple, no masses. Respiratory: clear to auscultation bilaterally, no wheezing, no crackles. Normal respiratory effort. No accessory muscle use.  Cardiovascular: Regular rate and rhythm, no murmurs / rubs / gallops.  Trace lower extremity edema.  Cap refill <2 secs. Abdomen: no tenderness, no masses palpated. No hepatosplenomegaly. Bowel sounds positive.  Musculoskeletal: no clubbing / cyanosis. No joint deformity upper and lower extremities. Good ROM, no contractures. Normal muscle tone.  Skin: no rashes, lesions, ulcers. No induration Neurologic: CN 2-12 grossly intact. Sensation intact. Strength 5/5 in all 4.  Psychiatric: Normal judgment and insight. Alert and oriented x 3. Normal mood.     Labs on Admission: I have personally reviewed following labs and imaging studies  CBC: Recent Labs  Lab 11/13/18 2048  WBC 16.5*  HGB 12.0*  HCT 37.2*  MCV 96.9  PLT 629   Basic Metabolic Panel: Recent Labs  Lab 11/13/18 2048  NA 140  K 3.5  CL 111  CO2 20*  GLUCOSE 141*  BUN 16  CREATININE 1.89*  CALCIUM 7.9*   GFR: Estimated Creatinine Clearance: 47.3 mL/min (A) (by C-G formula based on SCr of 1.89 mg/dL (H)). Liver Function Tests: Recent Labs  Lab 11/13/18 2048  AST 27  ALT 22  ALKPHOS 64  BILITOT 0.7  PROT 5.5*  ALBUMIN 3.4*   No results for input(s): LIPASE, AMYLASE in the last 168 hours. No results for input(s): AMMONIA in the last 168 hours. Coagulation Profile: No results for input(s): INR, PROTIME in the last 168 hours. Cardiac Enzymes: No results for input(s): CKTOTAL, CKMB,  CKMBINDEX, TROPONINI in the last 168 hours. BNP (last 3 results) No results for input(s): PROBNP in the last 8760 hours. HbA1C: No results for input(s): HGBA1C in the last 72 hours. CBG: Recent Labs  Lab 11/14/18 0031  GLUCAP 249*   Lipid Profile: No results for input(s): CHOL, HDL, LDLCALC, TRIG, CHOLHDL, LDLDIRECT in the last 72 hours. Thyroid Function Tests: No results for input(s): TSH, T4TOTAL, FREET4, T3FREE, THYROIDAB in the last 72 hours. Anemia Panel: No results for input(s): VITAMINB12, FOLATE, FERRITIN, TIBC, IRON, RETICCTPCT in the last 72 hours. Urine analysis:    Component Value Date/Time   COLORURINE yellow 09/10/2010 0820   APPEARANCEUR Clear 09/10/2010 0820   LABSPEC 1.010 09/10/2010 0820   PHURINE 5.0 09/10/2010 0820   GLUCOSEU NEGATIVE 09/04/2009 0840   HGBUR negative 09/10/2010 0820   BILIRUBINUR n 11/09/2016 1033   KETONESUR NEGATIVE 09/04/2009 0840   PROTEINUR 3+ 11/09/2016 1033   UROBILINOGEN 0.2 11/09/2016 1033   UROBILINOGEN 0.2 09/10/2010 0820   NITRITE n 11/09/2016 1033   NITRITE negative 09/10/2010 0820   LEUKOCYTESUR Negative 11/09/2016 1033    Radiological Exams on Admission: No results found.  Assessment/Plan Principal Problem:   Hematochezia Active Problems:   Dyslipidemia associated with type 2 diabetes mellitus ([place])   Hypertension associated with diabetes ([place])   CKD stage 3 due to type 2 diabetes mellitus ([place])   Sleep apnea   COLONIC POLYPS, HX OF   CAD (coronary artery disease)   Diabetes ([place])   Bryan Haley is a 67 y.o. male with medical history significant for CAD s/p DES, type 2 diabetes, hypertension, hyperlipidemia, CKD stage III, and OSA on CPAP, and history of colonic polyps who presented to the ED with hematochezia after undergoing colonoscopy with resection of multiple polyps the morning of admission.  Hematochezia: Occurring after resection of 27 polyps during colonoscopy.  He has had associated  lightheadedness and hypotension responsive to fluids.  GI consulted and plan for repeat colonoscopy in the morning.  Hemoglobin 12.0 on admission. -Give additional 1 L normal saline bolus, start maintenance fluids overnight -Recheck H&H now -Transfuse for further drop in hemoglobin  or recurrent bloody bowel movement -Maintain 2 large bore IVs -N.p.o. at midnight -Anticipate colonoscopy in a.m.  ADDENDUM: Repeat H&H shows drop in hemoglobin to 8.0.  Will transfuse 2 units now, keep 4 units ahead.  Recheck H&H posttransfusion.  Hypertension: Hypotensive on admission.  Hold home losartan, amlodipine, and doxazosin.  CAD s/p DES: Has been on low-dose aspirin.  He denies any chest pain. -Holding aspirin -Continue simvastatin  Diabetes: Hold home acarbose, Invokana, Ozempic, Prandin. -SSI  CKD stage III: Creatinine at baseline, continue to monitor.  Hyperlipidemia: Continue simvastatin.  OSA: CPAP nightly   DVT prophylaxis: SCDs Code Status: Full code Family Communication: Discussed with patient and wife at bedside Disposition Plan: Pending further GI work-up and stability of lower GI bleed Consults called: Gastroenterology consulted in the ED Admission status: Inpatient   Zada Finders MD [facility] Pager [phone]  If 7PM-7AM, please contact night-coverage [email or web address] Password [email or web address]  11/14/2018, 1:16 AM

## 2018-11-13 NOTE — ED Provider Notes (Addendum)
[facility] DEPT Provider Note   CSN: [id number] Arrival date & time: 11/13/18  2005     History   Chief Complaint Chief Complaint  Patient presents with  . Rectal Bleeding    HPI Bryan Haley is a 67 y.o. male.  HPI Complains of rectal bleeding onset 6 PM tonight.  Other associated symptoms include lightheadedness, worse with standing and improved with lying down.  Patient had colonoscopy earlier today by Dr. Fuller Plan as outpatient.  Multiple polyps removed.  Patient denies pain anywhere.  Denies shortness of breath.  No other associated symptoms.  No treatment prior to coming here. Past Medical History:  Diagnosis Date  . Allergy   . Arthritis    feet  . COLONIC POLYPS, HX OF 10/02/2008  . Coronary artery disease   . Coronary atherosclerosis of native coronary artery 12/05/2013   Prox RCA, and PLA DES 12/04/13.   . Diabetic retinopathy of both eyes ([place])    Laser surgery, Dr. Zadie Rhine  . 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 Exertional shortness of breath    "last 2-3 yrs" (12/04/2013)  . GOUT 07/11/2007  . Hyperlipidemia   . HYPERTENSION 07/06/2007  . HYPOGONADISM 06/11/2010  . OSA (obstructive sleep apnea) 10/08/2009   wears CPAP "more than 1/2 the time"  . Other and unspecified angina pectoris   . RENAL INSUFFICIENCY 12/11/2008  . Sleep apnea    no cpap     Patient Active Problem List   Diagnosis Date Noted  . Systolic murmur 37/02/8888  . BPH associated with nocturia 12/13/2017  . Diabetes ([place]) 11/04/2015  . First degree AV block 05/01/2015  . CAD (coronary artery disease) 05/15/2014  . Coronary atherosclerosis of native coronary artery 12/05/2013  . Abnormal stress test 12/04/2013  . Nonspecific abnormal unspecified cardiovascular function study 12/04/2013  . Morbid obesity ([place]) 11/06/2013  . Former smoker 11/06/2013  . Family history of ischemic heart disease 11/06/2013  .  Right foot pain 11/04/2013  . Contact dermatitis and eczema due to plant 11/04/2013  . Allergic rhinitis 08/22/2012  . 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 Sleep apnea 10/08/2009  . PROTEINURIA 06/11/2009  . CKD stage 3 due to type 2 diabetes mellitus ([place]) 12/11/2008  . COLONIC POLYPS, HX OF 10/02/2008  . 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 Gout 07/11/2007  . Dyslipidemia associated with type 2 diabetes mellitus ([place]) 07/06/2007  . Hypertension associated with diabetes ([place]) 07/06/2007    Past Surgical History:  Procedure Laterality Date  . CARPAL TUNNEL RELEASE Left ~ 2004  . COLONOSCOPY    . CORONARY ANGIOPLASTY WITH STENT PLACEMENT  12/04/2013   "2 stents"  . EYE EXAMINATION UNDER ANESTHESIA W/ RETINAL CRYOTHERAPY AND RETINAL LASER Bilateral 2005-2010   "for my diabetes" (12/04/2013)  . LEFT HEART CATHETERIZATION WITH CORONARY ANGIOGRAM Bilateral 12/04/2013   Procedure: LEFT HEART CATHETERIZATION WITH CORONARY ANGIOGRAM;  Surgeon: Candee Furbish, MD;  Location: [facility] CATH LAB;  Service: Cardiovascular;  Laterality: Bilateral;  . POLYPECTOMY          Home Medications    Prior to Admission medications   Medication Sig Start Date End Date Taking? Authorizing Provider  acarbose (PRECOSE) 50 MG tablet Take 1 tablet (50 mg total) by mouth 3 (three) times daily with meals. 03/01/18   Renato Shin, MD  allopurinol (ZYLOPRIM) 300 MG tablet Take 1 tablet (300 mg total) by mouth daily. 12/12/17   Stevie Kern  A, MD  amLODipine (NORVASC) 10 MG tablet Take 1 tablet (10 mg total) by mouth daily. 12/12/17   Dorena Cookey, MD  aspirin 81 MG chewable tablet Chew 1 tablet (81 mg total) by mouth daily. 12/05/13   Lyda Jester M, PA-C  bromocriptine (PARLODEL) 5 MG capsule TAKE 1 CAPSULE BY MOUTH EVERY DAY 07/01/18   Renato Shin, MD  canagliflozin [facility]) 300 MG TABS tablet Take 1 tablet (300 mg total) by mouth daily before breakfast. 07/26/18   Renato Shin, MD  doxazosin (CARDURA) 8 MG tablet Take 1  tablet (8 mg total) by mouth at bedtime. 12/12/17   Dorena Cookey, MD  fluticasone [facility]) 50 MCG/ACT nasal spray PLACE 1 SPRAY INTO BOTH NOSTRILS DAILY 11/16/16   Dorena Cookey, MD  glucose blood (ONE TOUCH ULTRA TEST) test strip USE TO TEST BLOOD USGAR TWICE A DAY 07/26/18   Renato Shin, MD  losartan (COZAAR) 100 MG tablet Take 1 tablet (100 mg total) by mouth 2 (two) times daily. 12/12/17   Dorena Cookey, MD  OZEMPIC 1 MG/DOSE SOPN INJECT 1 MG INTO THE SKIN ONCE A WEEK. 12/05/17   Renato Shin, MD  repaglinide (PRANDIN) 2 MG tablet TAKE 2 TABLETS BY MOUTH THREE TIMES DAILY BEFORE MEAL(S) 11/13/18   Renato Shin, MD  simvastatin (ZOCOR) 40 MG tablet Take 1 tablet (40 mg total) by mouth at bedtime. 12/12/17   Dorena Cookey, MD  traMADol [facility]) 50 MG tablet TAKE 1/2 TAB BY MOUTH TWICE A DAY 10/16/18   Vivi Barrack, MD    Family History Family History  Problem Relation Age of Onset  . Cancer Mother   . Pancreatic cancer Mother   . Heart disease Father   . Heart attack Father   . Diabetes Other   . Hyperlipidemia Other   . Hypertension Other   . Rectal cancer Sister   . Colon cancer Neg Hx   . Esophageal cancer Neg Hx   . Stomach cancer Neg Hx   . Stroke Neg Hx   . Colon polyps Neg Hx     Social History Social History   Tobacco Use  . Smoking status: Former Smoker    Packs/day: 1.50    Years: 45.00    Pack years: 67.50    Types: Cigarettes    Last attempt to quit: 10/02/2010    Years since quitting: 8.1  . Smokeless tobacco: Never Used  Substance Use Topics  . Alcohol use: Yes    Alcohol/week: 1.0 standard drinks    Types: 1 Shots of liquor per week  . Drug use: No     Allergies   Patient has no known allergies.   Review of Systems Review of Systems  Constitutional: Negative.   HENT: Negative.   Respiratory: Negative.   Cardiovascular: Negative.   Gastrointestinal: Positive for blood in stool.  Musculoskeletal: Negative.   Skin: Negative.     Neurological: Positive for light-headedness.  Psychiatric/Behavioral: Negative.   All other systems reviewed and are negative.    Physical Exam Updated Vital Signs BP (!) 83/59 (BP Location: Right Arm)   Pulse 89   Temp 98.3 F (36.8 C) (Oral)   Resp 18   SpO2 95%   Physical Exam Vitals signs and nursing note reviewed.  Constitutional:      Appearance: He is well-developed.  HENT:     Head: Normocephalic and atraumatic.  Eyes:     Conjunctiva/sclera: Conjunctivae normal.     Pupils: Pupils  are equal, round, and reactive to light.  Neck:     Musculoskeletal: Neck supple.     Thyroid: No thyromegaly.     Trachea: No tracheal deviation.  Cardiovascular:     Rate and Rhythm: Normal rate and regular rhythm.     Heart sounds: No murmur.  Pulmonary:     Effort: Pulmonary effort is normal.     Breath sounds: Normal breath sounds.  Abdominal:     General: Bowel sounds are normal. There is no distension.     Palpations: Abdomen is soft.     Tenderness: There is no abdominal tenderness.  Genitourinary:    Comments: Rectum normal tone.  Gross red blood present Musculoskeletal: Normal range of motion.        General: No tenderness.  Skin:    General: Skin is warm and dry.     Findings: No rash.  Neurological:     Mental Status: He is alert.     Coordination: Coordination normal.      ED Treatments / Results  Labs (all labs ordered are listed, but only abnormal results are displayed) Labs Reviewed  COMPREHENSIVE METABOLIC PANEL  CBC  POC OCCULT BLOOD, ED  TYPE AND SCREEN    EKG None  Radiology No results found.  Procedures Procedures (including critical care time)  Medications Ordered in ED Medications - No data to display  Lab work remarkable for mild anemia, new over level months ago, leukocytosis and mild renal insufficiency which is chronic.  I consulted Dr. Edison Nasuti from gastroenterology who suggests n.p.o. after midnight.  He will consult on patient in  the hospital and plan on colonoscopy tomorrow. Initial Impression / Assessment and Plan / ED Course  I have reviewed the triage vital signs and the nursing notes.  Pertinent labs & imaging results that were available during my care of the patient were reviewed by me and considered in my medical decision making (see chart for details).    Patient resting comfortably after treatment with 1 L of normal saline intravenous bolus.  Blood pressure has normalized Consulted Dr.Vishal hospitalist service who will arrange for overnight stay Final Clinical Impressions(s) / ED Diagnoses  Diagnosis #1 acute lower GI bleeding #2 hypotension #3 anemia #4 chronic renal insufficiency CRITICAL CARE Performed by: Orlie Dakin Total critical care time: 35 minutes Critical care time was exclusive of separately billable procedures and treating other patients. Critical care was necessary to treat or prevent imminent or life-threatening deterioration. Critical care was time spent personally by me on the following activities: development of treatment plan with patient and/or surrogate as well as nursing, discussions with consultants, evaluation of patient's response to treatment, examination of patient, obtaining history from patient or surrogate, ordering and performing treatments and interventions, ordering and review of laboratory studies, ordering and review of radiographic studies, pulse oximetry and re-evaluation of patient's condition. Final diagnoses:  None    ED Discharge Orders    None       Orlie Dakin, MD 11/13/18 [place], [place], MD 11/13/18 2224

## 2018-11-13 NOTE — Patient Instructions (Addendum)
Handout given on polyps and diverticulosis and hemorrhoids. No NSIDS such as ibuprofen and Aleve and Aspirin for 2 weeks.   YOU HAD AN ENDOSCOPIC PROCEDURE TODAY AT [facility]:   Refer to the procedure report that was given to you for any specific questions about what was found during the examination.  If the procedure report does not answer your questions, please call your gastroenterologist to clarify.  If you requested that your care partner not be given the details of your procedure findings, then the procedure report has been included in a sealed envelope for you to review at your convenience later.  YOU SHOULD EXPECT: Some feelings of bloating in the abdomen. Passage of more gas than usual.  Walking can help get rid of the air that was put into your GI tract during the procedure and reduce the bloating. If you had a lower endoscopy (such as a colonoscopy or flexible sigmoidoscopy) you may notice spotting of blood in your stool or on the toilet paper. If you underwent a bowel prep for your procedure, you may not have a normal bowel movement for a few days.  Please Note:  You might notice some irritation and congestion in your nose or some drainage.  This is from the oxygen used during your procedure.  There is no need for concern and it should clear up in a day or so.  SYMPTOMS TO REPORT IMMEDIATELY:   Following lower endoscopy (colonoscopy or flexible sigmoidoscopy):  Excessive amounts of blood in the stool  Significant tenderness or worsening of abdominal pains  Swelling of the abdomen that is new, acute  Fever of 100F or higher  For urgent or emergent issues, a gastroenterologist can be reached at any hour by calling [phone].   DIET:  We do recommend a small meal at first, but then you may proceed to your regular diet.  Drink plenty of fluids but you should avoid alcoholic beverages for 24 hours.  ACTIVITY:  You should plan to take it easy for the rest of  today and you should NOT DRIVE or use heavy machinery until tomorrow (because of the sedation medicines used during the test).    FOLLOW UP: Our staff will call the number listed on your records the next business day following your procedure to check on you and address any questions or concerns that you may have regarding the information given to you following your procedure. If we do not reach you, we will leave a message.  However, if you are feeling well and you are not experiencing any problems, there is no need to return our call.  We will assume that you have returned to your regular daily activities without incident.  If any biopsies were taken you will be contacted by phone or by letter within the next 1-3 weeks.  Please call us at [phone] if you have not heard about the biopsies in 3 weeks.    SIGNATURES/CONFIDENTIALITY: You and/or your care partner have signed paperwork which will be entered into your electronic medical record.  These signatures attest to the fact that that the information above on your After Visit Summary has been reviewed and is understood.  Full responsibility of the confidentiality of this discharge information lies with you and/or your care-partner.

## 2018-11-13 NOTE — Progress Notes (Signed)
Pt's states no medical or surgical changes since previsit or office visit. 

## 2018-11-13 NOTE — Op Note (Signed)
[facility] Patient Name: Bryan Haley Procedure Date: 11/13/2018 7:58 AM MRN: [id number] Endoscopist: Ladene Artist , MD Age: 67 Referring MD:  Date of Birth: 1951-02-12 Gender: Male Account #: [id number] Procedure:                Colonoscopy Indications:              High risk colon cancer surveillance: Personal                            history of sessile serrated colon polyp (10 mm or                            less in size) Medicines:                Monitored Anesthesia Care Procedure:                Pre-Anesthesia Assessment:                           - Prior to the procedure, a History and Physical                            was performed, and patient medications and                            allergies were reviewed. The patient's tolerance of                            previous anesthesia was also reviewed. The risks                            and benefits of the procedure and the sedation                            options and risks were discussed with the patient.                            All questions were answered, and informed consent                            was obtained. Prior Anticoagulants: The patient has                            taken no previous anticoagulant or antiplatelet                            agents. ASA Grade Assessment: III - A patient with                            severe systemic disease. After reviewing the risks                            and benefits, the patient was deemed in  satisfactory condition to undergo the procedure.                           After obtaining informed consent, the colonoscope                            was passed under direct vision. Throughout the                            procedure, the patient's blood pressure, pulse, and                            oxygen saturations were monitored continuously. The                            Model CF-HQ190L [id number] [id number]) scope was  introduced                            through the anus and advanced to the the cecum,                            identified by appendiceal orifice and ileocecal                            valve. The ileocecal valve, appendiceal orifice,                            and rectum were photographed. The quality of the                            bowel preparation was good. The patient tolerated                            the procedure well. The colonoscopy was somewhat                            difficult. Scope In: 8:12:54 AM Scope Out: 9:10:24 AM Scope Withdrawal Time: 0 hours 55 minutes 18 seconds  Total Procedure Duration: 0 hours 57 minutes 30 seconds  Findings:                 The perianal and digital rectal examinations were                            normal.                           A 25 mm polyp was found in the cecum. The polyp was                            sessile. The polyp was removed with a piecemeal                            technique using a hot snare. Resection and  retrieval were complete.                           Fifteen sessile polyps were found in the transverse                            colon (11), ascending colon (2) and cecum (2). The                            polyps were 10 to 20 mm in size. These polyps were                            removed with a hot snare. Resection and retrieval                            were complete.                           Eleven sessile polyps were found in the descending                            colon (2), transverse colon (7) and ascending colon                            (2). The polyps were 6 to 9 mm in size. These                            polyps were removed with a cold snare. Resection                            and retrieval were complete.                           Six sessile polyps were found in the transverse                            colon. The polyps were 5 to 7 mm in size. Not                             removed due to spasm and length of procedure.                           Many medium-mouthed diverticula were found in the                            left colon.                           Internal hemorrhoids were found during                            retroflexion. The hemorrhoids were small and Grade  I (internal hemorrhoids that do not prolapse).                           The exam was otherwise without abnormality on                            direct and retroflexion views. Complications:            No immediate complications. Estimated blood loss:                            None. Estimated Blood Loss:     Estimated blood loss: none. Impression:               - One 25 mm polyp in the cecum, removed piecemeal                            using a hot snare. Resected and retrieved.                           - Fifteen 10 to 20 mm polyps in the transverse                            colon, in the ascending colon and in the cecum,                            removed with a hot snare. Resected and retrieved.                           - Eleven 6 to 9 mm polyps in the descending colon,                            in the transverse colon and in the ascending colon,                            removed with a cold snare. Resected and retrieved.                           - Six 5 to 7 mm polyps in the transverse colon. Not                            removed.                           - Diverticulosis in the left colon.                           - Internal hemorrhoids.                           - The examination was otherwise normal on direct                            and retroflexion views. Recommendation:           -  Repeat colonoscopy in 6 months for surveillance                            after piecemeal polypectomy.                           - Patient has a contact number available for                            emergencies. The signs and symptoms of potential                             delayed complications were discussed with the                            patient. Return to normal activities tomorrow.                            Written discharge instructions were provided to the                            patient.                           - Resume previous diet.                           - Continue present medications.                           - Await pathology results.                           - No aspirin, ibuprofen, naproxen, or other                            non-steroidal anti-inflammatory drugs for 2 weeks                            after polyp removal. Ladene Artist, MD 11/13/2018 9:29:14 AM This report has been signed electronically.

## 2018-11-13 NOTE — Telephone Encounter (Signed)
I spoke with his wife this evening. He's had hematochezia, large volume, twice since getting home from his colonoscopy. Feels a bit dizzy.  I recommended she take him immediately to the ER or call 911.

## 2018-11-13 NOTE — ED Triage Notes (Signed)
Pt reports colonoscopy this morning by dr. Fuller Plan. Reports around [place] started having rectal bleeding, 3 episodes. No pain. C/o dizziness.

## 2018-11-13 NOTE — Progress Notes (Signed)
PT taken to PACU. Monitors in place. VSS. Report given to RN. 

## 2018-11-13 NOTE — ED Notes (Signed)
Dr. Winfred Leeds notified of pt vitals and status. On the way to treatment room, pt said he had to use the bathroom, stopped at the restroom, pt had large bloody BM. Returned pt to room, IV x2 established and placed on the monitor.

## 2018-11-13 NOTE — Progress Notes (Signed)
Pt noted to have possible 2nd degree heart block type I, not listed in history. Strip placed with chart, and MD made aware. BP is stable and the patient is asymptomatic.   Hebert Soho, CRNA

## 2018-11-13 NOTE — Progress Notes (Signed)
Called to room to assist during endoscopic procedure.  Patient ID and intended procedure confirmed with present staff. Received instructions for my participation in the procedure from the performing physician.  

## 2018-11-14 ENCOUNTER — Telehealth: Payer: Self-pay | Admitting: *Deleted

## 2018-11-14 ENCOUNTER — Encounter (HOSPITAL_COMMUNITY): Payer: Self-pay | Admitting: Anesthesiology

## 2018-11-14 ENCOUNTER — Inpatient Hospital Stay (HOSPITAL_COMMUNITY): Payer: Managed Care, Other (non HMO)

## 2018-11-14 ENCOUNTER — Encounter (HOSPITAL_COMMUNITY): Payer: Self-pay | Admitting: Gastroenterology

## 2018-11-14 ENCOUNTER — Encounter (HOSPITAL_COMMUNITY)
Admission: EM | Disposition: A | Discharge status: Home / Self Care | Payer: Self-pay | Source: Home / Self Care | Attending: Family Medicine

## 2018-11-14 DIAGNOSIS — K91840 Postprocedural hemorrhage and hematoma of a digestive system organ or structure following a digestive system procedure: Principal | ICD-10-CM

## 2018-11-14 DIAGNOSIS — D500 Iron deficiency anemia secondary to blood loss (chronic): Secondary | ICD-10-CM

## 2018-11-14 DIAGNOSIS — D123 Benign neoplasm of transverse colon: Secondary | ICD-10-CM

## 2018-11-14 DIAGNOSIS — D124 Benign neoplasm of descending colon: Secondary | ICD-10-CM

## 2018-11-14 DIAGNOSIS — K633 Ulcer of intestine: Secondary | ICD-10-CM

## 2018-11-14 DIAGNOSIS — D122 Benign neoplasm of ascending colon: Secondary | ICD-10-CM

## 2018-11-14 DIAGNOSIS — K922 Gastrointestinal hemorrhage, unspecified: Secondary | ICD-10-CM | POA: Diagnosis present

## 2018-11-14 HISTORY — PX: COLONOSCOPY WITH PROPOFOL: SHX5780

## 2018-11-14 HISTORY — PX: HOT HEMOSTASIS: SHX5433

## 2018-11-14 HISTORY — PX: SUBMUCOSAL INJECTION: SHX5543

## 2018-11-14 LAB — CBG MONITORING, ED
GLUCOSE-CAPILLARY: 249 mg/dL — AB (ref 70–99)
Glucose-Capillary: 327 mg/dL — ABNORMAL HIGH (ref 70–99)
Glucose-Capillary: 266 mg/dL — ABNORMAL HIGH (ref 70–99)

## 2018-11-14 LAB — BASIC METABOLIC PANEL
Anion gap: 8 (ref 5–15)
BUN: 27 mg/dL — ABNORMAL HIGH (ref 8–23)
CO2: 17 mmol/L — ABNORMAL LOW (ref 22–32)
Calcium: 6.5 mg/dL — ABNORMAL LOW (ref 8.9–10.3)
Chloride: 117 mmol/L — ABNORMAL HIGH (ref 98–111)
Creatinine, Ser: 3.45 mg/dL — ABNORMAL HIGH (ref 0.61–1.24)
GFR calc non Af Amer: 17 mL/min — ABNORMAL LOW (ref >60)
GFR, EST AFRICAN AMERICAN: 20 mL/min — AB (ref >60)
Glucose, Bld: 233 mg/dL — ABNORMAL HIGH (ref 70–99)
Potassium: 4 mmol/L (ref 3.5–5.1)
Sodium: 142 mmol/L (ref 135–145)

## 2018-11-14 LAB — PREPARE RBC (CROSSMATCH)

## 2018-11-14 LAB — GLUCOSE, CAPILLARY
Glucose-Capillary: 146 mg/dL — ABNORMAL HIGH (ref 70–99)
Glucose-Capillary: 166 mg/dL — ABNORMAL HIGH (ref 70–99)
Glucose-Capillary: 181 mg/dL — ABNORMAL HIGH (ref 70–99)

## 2018-11-14 LAB — HEMOGLOBIN AND HEMATOCRIT, BLOOD
HCT: 22.8 % — ABNORMAL LOW (ref 39.0–52.0)
HCT: 21.2 % — ABNORMAL LOW (ref 39.0–52.0)
HEMATOCRIT: 25.4 % — AB (ref 39.0–52.0)
HEMATOCRIT: 29.9 % — AB (ref 39.0–52.0)
Hemoglobin: 8.1 g/dL — ABNORMAL LOW (ref 13.0–17.0)
Hemoglobin: 7.4 g/dL — ABNORMAL LOW (ref 13.0–17.0)
Hemoglobin: 7 g/dL — ABNORMAL LOW (ref 13.0–17.0)
Hemoglobin: 9.7 g/dL — ABNORMAL LOW (ref 13.0–17.0)

## 2018-11-14 LAB — CBC
HCT: 23 % — ABNORMAL LOW (ref 39.0–52.0)
Hemoglobin: 7.3 g/dL — ABNORMAL LOW (ref 13.0–17.0)
MCH: 31.1 pg (ref 26.0–34.0)
MCHC: 31.7 g/dL (ref 30.0–36.0)
MCV: 97.9 fL (ref 80.0–100.0)
NRBC: 0 % (ref 0.0–0.2)
Platelets: 117 10*3/uL — ABNORMAL LOW (ref 150–400)
RBC: 2.35 MIL/uL — ABNORMAL LOW (ref 4.22–5.81)
RDW: 14.4 % (ref 11.5–15.5)
WBC: 10.2 10*3/uL (ref 4.0–10.5)

## 2018-11-14 LAB — ABO/RH: ABO/RH(D): O NEG

## 2018-11-14 LAB — PROTIME-INR
INR: 1.18
Prothrombin Time: 14.9 seconds (ref 11.4–15.2)

## 2018-11-14 LAB — MRSA PCR SCREENING: MRSA by PCR: NEGATIVE

## 2018-11-14 SURGERY — COLONOSCOPY WITH PROPOFOL
Anesthesia: Moderate Sedation

## 2018-11-14 MED ORDER — DIPHENHYDRAMINE HCL 50 MG/ML IJ SOLN
INTRAMUSCULAR | Status: AC
  Filled 2018-11-14: qty 1

## 2018-11-14 MED ORDER — EPINEPHRINE PF 1 MG/10ML IJ SOSY
PREFILLED_SYRINGE | INTRAMUSCULAR | Status: AC
  Filled 2018-11-14: qty 10

## 2018-11-14 MED ORDER — SODIUM CHLORIDE 0.9% IV SOLUTION: Freq: Once | INTRAVENOUS | Status: DC

## 2018-11-14 MED ORDER — PEG 3350-KCL-NA BICARB-NACL 420 G PO SOLR: 4000.0000 mL | Freq: Once | ORAL | Status: DC

## 2018-11-14 MED ORDER — FENTANYL CITRATE (PF) 100 MCG/2ML IJ SOLN
INTRAMUSCULAR | Status: AC
  Filled 2018-11-14: qty 4

## 2018-11-14 MED ORDER — PEG-KCL-NACL-NASULF-NA ASC-C 100 G PO SOLR
1.0000 | Freq: Once | ORAL | Status: AC
  Administered 2018-11-14: 200 g via ORAL
  Filled 2018-11-14: qty 1

## 2018-11-14 MED ORDER — FENTANYL CITRATE (PF) 100 MCG/2ML IJ SOLN
INTRAMUSCULAR | Status: AC
  Filled 2018-11-14: qty 2

## 2018-11-14 MED ORDER — MIDAZOLAM HCL (PF) 5 MG/ML IJ SOLN
INTRAMUSCULAR | Status: AC
  Filled 2018-11-14: qty 3

## 2018-11-14 MED ORDER — PEG 3350-KCL-NABCB-NACL-NASULF 236 G PO SOLR
4000.0000 mL | Freq: Once | ORAL | Status: DC
  Filled 2018-11-14: qty 4000

## 2018-11-14 MED ORDER — TECHNETIUM TC 99M-LABELED RED BLOOD CELLS IV KIT
24.0000 | PACK | Freq: Once | INTRAVENOUS | Status: AC | PRN
  Administered 2018-11-14: 24 via INTRAVENOUS

## 2018-11-14 MED ORDER — MIDAZOLAM HCL (PF) 5 MG/ML IJ SOLN
INTRAMUSCULAR | Status: DC | PRN
  Administered 2018-11-14: 2 mg via INTRAVENOUS
  Administered 2018-11-14 (×8): 1 mg via INTRAVENOUS

## 2018-11-14 MED ORDER — FENTANYL CITRATE (PF) 100 MCG/2ML IJ SOLN
INTRAMUSCULAR | Status: DC | PRN
  Administered 2018-11-14 (×9): 25 ug via INTRAVENOUS

## 2018-11-14 MED ORDER — SODIUM CHLORIDE 0.9 % IV BOLUS
1000.0000 mL | Freq: Once | INTRAVENOUS | Status: AC
  Administered 2018-11-14: 1000 mL via INTRAVENOUS

## 2018-11-14 MED ORDER — SODIUM CHLORIDE 0.9 % IV SOLN
Freq: Once | INTRAVENOUS | Status: AC
  Administered 2018-11-14: 09:00:00 via INTRAVENOUS

## 2018-11-14 MED ORDER — DIPHENHYDRAMINE HCL 50 MG/ML IJ SOLN
INTRAMUSCULAR | Status: DC | PRN
  Administered 2018-11-14: 50 mg via INTRAVENOUS

## 2018-11-14 MED ORDER — SODIUM CHLORIDE 0.9 % IV SOLN: Freq: Once | INTRAVENOUS | Status: DC

## 2018-11-14 MED ORDER — SODIUM CHLORIDE 0.9 % IV BOLUS
500.0000 mL | Freq: Once | INTRAVENOUS | Status: AC
  Administered 2018-11-14: 500 mL via INTRAVENOUS

## 2018-11-14 SURGICAL SUPPLY — 22 items

## 2018-11-14 NOTE — ED Notes (Signed)
K.  Schorr  Notified  that per Blood bank , the available PRBC is "O POSITIVE " and pt. Is "O Negative " but is ok to received "O+ " product .  NP OK with the said product to be transfused. Pt. Also informed , consent obtained and witnessed by this Nurse and Charge Nurse Jennifer,RN.

## 2018-11-14 NOTE — Progress Notes (Signed)
Pt seen for assistance with cpap.  Pt was found asleep.  Spoke to family at bedside regarding patient's use of cpap at home.  Per family, pt hardly wears it at home and prefers to hold off for tonight since he is already resting comfortably.  Family was made aware that RT is available all night if needed.  RN aware.

## 2018-11-14 NOTE — ED Notes (Signed)
Bed: CM03 Expected date:  Expected time:  Means of arrival:  Comments: rm 7

## 2018-11-14 NOTE — Interval H&P Note (Signed)
History and Physical Interval Note:  11/14/2018 5:19 PM  Bryan Haley  has presented today for surgery, with the diagnosis of post polypectomy bleed  The various methods of treatment have been discussed with the patient and family. After consideration of risks, benefits and other options for treatment, the patient has consented to  Colonoscopy with Moderate Sedation as a surgical intervention.    The risks and benefits of endoscopic evaluation were discussed with the patient; these include but are not limited to the risk of perforation, infection, bleeding, missed lesions, lack of diagnosis, severe illness requiring hospitalization, as well as anesthesia and sedation related illnesses.  The patient is agreeable to proceed.   The patient's history has been reviewed, patient examined, no change in status, stable for surgery.  I have reviewed the patient's chart and labs.  Questions were answered to the patient's satisfaction.     [employer]

## 2018-11-14 NOTE — Progress Notes (Signed)
Patient ID: Bryan Haley, male   DOB: [birth year], 67 y.o.   MRN: [id number]  PROGRESS NOTE    Bryan Haley  NFA:[id number] DOB: November 30, 1950 DOA: 11/13/2018 PCP: Vivi Barrack, MD    Brief Narrative:  Bryan Haley is a 67 y.o. male with medical history significant for CAD s/p DES, type 2 diabetes, hypertension, hyperlipidemia, CKD stage III, and OSA on CPAP, and history of colonic polyps who presented to the ED with hematochezia.  Patient underwent colonoscopy morning of admission 11/13/2018 for high risk colon cancer surveillance.  Per op note 33 colonic polyps were seen, 27 which were resected.  Left colon diverticulosis and internal hemorrhoids are also noted.  He did well postop and was discharged home.  This afternoon he developed 3 episodes of large-volume hematochezia associated with lightheadedness without syncope.  His wife called the gastroenterologist's office and was instructed to come to the emergency department.  He is on low-dose aspirin for history of CAD and reports taking it last this morning.  He denies any NSAID use.    Patient has continued to bleed this morning.  He has became hypotensive despite fluid resuscitation.  Systolic blood pressure has come into the 60s.  Responded to IV fluid briefly.  Transfuse 2 units of packed red blood cells and being admitted   Assessment & Plan:   Principal Problem:   Hematochezia Active Problems:   Dyslipidemia associated with type 2 diabetes mellitus (Bryan Haley)   Hypertension associated with diabetes (Bryan Haley)   CKD stage 3 due to type 2 diabetes mellitus (Bryan Haley)   Sleep apnea   COLONIC POLYPS, HX OF   CAD (coronary artery disease)   Diabetes (Bryan Haley)   GI bleed   #1 acute blood loss anemia: Secondary to ongoing GI bleed with hypotension.  Patient will be transferred to stepdown unit.  Give bolus of normal saline and transfused 2 more units of packed red blood cells.  RBC tagged assay ongoing.  #2 multiple colonic polyps: Status  post removal of polyps as above.  Continue per GI  #3 hypertension: Patient currently hypotensive.  We will hold antihypertensives at this juncture until patient improves.  #4 chronic kidney disease stage III: Secondary to type 2 diabetes.  Renal function appears to be at baseline.  #5 diabetes: Patient's blood sugar currently at baseline.  Continue sliding scale insulin  #6 coronary artery disease: No coronary artery disease decompensation.  Patient will need to get his hemoglobin closer to 10 g to avoid ischemia.  #7 obstructive sleep apnea: Continue CPAP at night   DVT prophylaxis: SCD  Code Status: Full code Family Communication: Wife at bedside Disposition Plan: Home  Consultants:   Gastroenterology Dr. Ardis Hughs  Procedures:   RBC scan ordered   Antimicrobials:   None    Subjective: Patient is weak but awake alert and oriented.  He is still having significant rectal bleed.  Hypotensive.  Objective: Vitals:   11/14/18 0635 11/14/18 0700 11/14/18 0730 11/14/18 0800  BP: (!) 89/47 (!) 103/47 (!) 74/50 (!) 95/48  Pulse: 91 88    Resp: 17  17 19   Temp: 97.8 F (36.6 C)     TempSrc: Oral     SpO2: 100% 94%      Intake/Output Summary (Last 24 hours) at 11/14/2018 0825 Last data filed at 11/14/2018 0615 Gross per 24 hour  Intake 4730 ml  Output -  Net 4730 ml   There were no vitals filed for this visit.  Examination:  General exam: Appears calm and comfortable, Weak  Respiratory system: Clear to auscultation. Respiratory effort normal. Cardiovascular system: S1 & S2 heard, RRR. No JVD, murmurs, rubs, gallops or clicks. No pedal edema. Gastrointestinal system: Abdomen is nondistended, soft and nontender. No organomegaly or masses felt. Normal bowel sounds heard. Central nervous system: Alert and oriented. No focal neurological deficits. Extremities: Symmetric 5 x 5 power. Skin: No rashes, lesions or ulcers Psychiatry: Judgement and insight appear normal.  Mood & affect appropriate.     Data Reviewed: I have personally reviewed following labs and imaging studies  CBC: Recent Labs  Lab 11/13/18 2048 11/13/18 2339  WBC 16.5*  --   HGB 12.0* 8.1*  HCT 37.2* 25.4*  MCV 96.9  --   PLT 174  --    Basic Metabolic Panel: Recent Labs  Lab 11/13/18 2048  NA 140  K 3.5  CL 111  CO2 20*  GLUCOSE 141*  BUN 16  CREATININE 1.89*  CALCIUM 7.9*   GFR: Estimated Creatinine Clearance: 47.3 mL/min (A) (by C-G formula based on SCr of 1.89 mg/dL (H)). Liver Function Tests: Recent Labs  Lab 11/13/18 2048  AST 27  ALT 22  ALKPHOS 64  BILITOT 0.7  PROT 5.5*  ALBUMIN 3.4*   No results for input(s): LIPASE, AMYLASE in the last 168 hours. No results for input(s): AMMONIA in the last 168 hours. Coagulation Profile: No results for input(s): INR, PROTIME in the last 168 hours. Cardiac Enzymes: No results for input(s): CKTOTAL, CKMB, CKMBINDEX, TROPONINI in the last 168 hours. BNP (last 3 results) No results for input(s): PROBNP in the last 8760 hours. HbA1C: No results for input(s): HGBA1C in the last 72 hours. CBG: Recent Labs  Lab 11/14/18 0031 11/14/18 0439 11/14/18 0800  GLUCAP 249* 327* 266*   Lipid Profile: No results for input(s): CHOL, HDL, LDLCALC, TRIG, CHOLHDL, LDLDIRECT in the last 72 hours. Thyroid Function Tests: No results for input(s): TSH, T4TOTAL, FREET4, T3FREE, THYROIDAB in the last 72 hours. Anemia Panel: No results for input(s): VITAMINB12, FOLATE, FERRITIN, TIBC, IRON, RETICCTPCT in the last 72 hours. Sepsis Labs: No results for input(s): PROCALCITON, LATICACIDVEN in the last 168 hours.  No results found for this or any previous visit (from the past 240 hour(s)).       Radiology Studies: No results found.      Scheduled Meds: . sodium chloride   Intravenous Once  . insulin aspart  0-9 Units Subcutaneous Q4H  . polyethylene glycol-electrolytes  4,000 mL Oral Once  . simvastatin  40 mg Oral  q1800  . sodium chloride flush  3 mL Intravenous Q12H   Continuous Infusions: . sodium chloride 100 mL/hr at 11/14/18 0046  . sodium chloride       LOS: 1 day    Time spent: 38 minutes    Loy Little,LAWAL, MD [facility] Pager [phone] If 7PM-7AM, please contact night-coverage [email or web address] Password [email or web address] 11/14/2018, 8:25 AM

## 2018-11-14 NOTE — ED Notes (Signed)
Spoke to [employer] , updated on  Pt.'s latest condition. Also informed that pt. Has been waiting for med surg bed all night  Here in ED . Informed this Nurse that pt. Will be reevaluated by Hospitalist in AM.

## 2018-11-14 NOTE — Telephone Encounter (Signed)
  Follow up Call-  Call back number 11/13/2018  Post procedure Call Back phone  # [phone]  Permission to leave phone message Yes  Some recent data might be hidden     Patient questions:  Patient is in the hospital due to bleeding.  Blood count is low, but blood transfusion is not necessary. Patient's wife told me that he passed out at home after going to bathroom.  Patient was met by Dr. Ardis Hughs at the hospital.

## 2018-11-14 NOTE — Progress Notes (Signed)
At 16:59 Endo RN took over care of patient at bedside for his bedside EGD/colonoscopy being preformed.  1st unit of blood finished while Endo RN monitoring pt and 2nd unit being hung and documented by her as well.  Will continue to monitor

## 2018-11-14 NOTE — Consult Note (Addendum)
Referring Provider: No ref. provider found Primary Care Physician:  Vivi Barrack, MD Primary Gastroenterologist:  Dr. Lucio Edward  Reason for Consultation: Post polypectomy bleeding  IMPRESSION:  Post-polypectomy bleed    - 28 polyps resected during a surveillance colonoscopy yesterday GI blood loss anemia with associated hypotension    - hgb 12.0->8.1  Multiple large polyps were resected during a surveillance colonoscopy yesterday. Developed hematochezia yesterday afternoon with associated hemodynamic instability.  The 25 mm cecal polyp would be the most likely to have bleed as it was removed piecemeal and removal was somewhat technically difficult due to part of the polyp located behind the IC valve.  Recommend tagged bleeding scan +/-angio today to localize the site of bleeding given his hemodynamic instability. If tagged scan is negative, will proceed with colonoscopy after a quick bowel prep. Colonoscopy tentatively scheduled with Dr. Rush Landmark this afternoon.   PLAN: NPO Maintain two large bore IVs Serial hgb/hct with transfusion as indicated - with repeat hemoglobin now PT/INR now Bleeding scan +/- angio if the bleeding scan is positive Plan for colonoscopy later today with a quick bowel prep in the event that the bleeding scan in negative  I consented the patient at the bedside today discussing the risks, benefits, and alternatives to endoscopic evaluation. In particular, we discussed the risks that include, but are not limited to, reaction to medication, cardiopulmonary compromise, bleeding requiring blood transfusion, aspiration resulting in pneumonia, perforation requiring surgery, and even death. We reviewed the risk of missed lesion including polyps or even cancer. The patient acknowledges these risks and asks that we proceed.   HPI: Bryan Haley is a 67 y.o. male seen in consultation at the request of Dr. Jonelle Sidle for further evaluation of hematochezia.  History  is obtained to the patient, review of his electronic health record, and his wife is present at the bedside.  He has coronary artery disease, diabetes, hypertension, hyperlipidemia, stage III chronic kidney disease, obstructive sleep apnea on CPAP, and obesity.  He has a history of colon polyps. Initial tubular adenoma removed on colonoscopy in 2002. Normal colonoscopy 2009.  Sessile serrated adenoma resected on surveillance colonoscopy 2014.    Bryan Haley had a surveillance colonoscopy yesterday.  Per Dr. Silvio Pate procedure note he had 5 colonic polyps.  Twenty-eight polyps were removed during the procedure.  The largest was a 25 mm polyp in the cecum that was removed with hot snare in a piecemeal fashion.  15 additional polyps ranged from 10 to 20 mm and were located throughout the transvere colon, ascending colon and cecum were resected using a hot snare.  11 smaller polyps were removed from the descending transverse and a sending colon with a cold snare.  6 polyps in the transverse colon were not removed due to ongoing spasm.  Dr. Fuller Plan also noted left-sided diverticulosis and grade 1 internal hemorrhoids.  He had no immediate complications and was discharged to home.  Yesterday afternoon he experienced 3 episodes of large-volume hematochezia associated with lightheadedness.  He was instructed by our on-call physician to go to the emergency room.  On arrival he was hemodynamically stable.  But after another large bloody bowel movement in the emergency room, he became hypotense.  His blood pressure has responded to fluids but he has overall remained hypotensive since that time.  He has had 2 additional large bloody bowel movements at approximately 4 AM and 630 this morning.  He currently feels weak and lightheaded.  No abdominal pain, abdominal cramping,  nausea, vomiting. No other associated symptoms. No identified exacerbating or relieving features.   Initial hemoglobin was 12, down to 8.1 last night.    Bryan Haley takes an 81 mg aspirin daily.  He denies use of any NSAIDs, antiplatelet agents, or anticoagulants.     Past Medical History:  Diagnosis Date  . Allergy   . Arthritis    feet  . COLONIC POLYPS, HX OF 10/02/2008  . Coronary artery disease   . Coronary atherosclerosis of native coronary artery 12/05/2013   Prox RCA, and PLA DES 12/04/13.   . Diabetic retinopathy of both eyes ([place])    Laser surgery, Dr. Zadie Rhine  . 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 Exertional shortness of breath    "last 2-3 yrs" (12/04/2013)  . GOUT 07/11/2007  . Hyperlipidemia   . HYPERTENSION 07/06/2007  . HYPOGONADISM 06/11/2010  . OSA (obstructive sleep apnea) 10/08/2009   wears CPAP "more than 1/2 the time"  . Other and unspecified angina pectoris   . RENAL INSUFFICIENCY 12/11/2008  . Sleep apnea    no cpap     Past Surgical History:  Procedure Laterality Date  . CARPAL TUNNEL RELEASE Left ~ 2004  . COLONOSCOPY    . CORONARY ANGIOPLASTY WITH STENT PLACEMENT  12/04/2013   "2 stents"  . EYE EXAMINATION UNDER ANESTHESIA W/ RETINAL CRYOTHERAPY AND RETINAL LASER Bilateral 2005-2010   "for my diabetes" (12/04/2013)  . LEFT HEART CATHETERIZATION WITH CORONARY ANGIOGRAM Bilateral 12/04/2013   Procedure: LEFT HEART CATHETERIZATION WITH CORONARY ANGIOGRAM;  Surgeon: Candee Furbish, MD;  Location: [facility] CATH LAB;  Service: Cardiovascular;  Laterality: Bilateral;  . POLYPECTOMY      Prior to Admission medications   Medication Sig Start Date End Date Taking? Authorizing Provider  acarbose (PRECOSE) 50 MG tablet Take 1 tablet (50 mg total) by mouth 3 (three) times daily with meals. 03/01/18  Yes Renato Shin, MD  allopurinol (ZYLOPRIM) 300 MG tablet Take 1 tablet (300 mg total) by mouth daily. 12/12/17  Yes Dorena Cookey, MD  amLODipine (NORVASC) 10 MG tablet Take 1 tablet (10 mg total) by mouth daily. 12/12/17  Yes Dorena Cookey, MD  aspirin 81 MG chewable tablet  Chew 1 tablet (81 mg total) by mouth daily. 12/05/13  Yes Lyda Jester M, PA-C  bromocriptine (PARLODEL) 5 MG capsule TAKE 1 CAPSULE BY MOUTH EVERY DAY 07/01/18  Yes Renato Shin, MD  canagliflozin [facility]) 300 MG TABS tablet Take 1 tablet (300 mg total) by mouth daily before breakfast. 07/26/18  Yes Renato Shin, MD  doxazosin (CARDURA) 8 MG tablet Take 1 tablet (8 mg total) by mouth at bedtime. 12/12/17  Yes Dorena Cookey, MD  fluticasone [facility]) 50 MCG/ACT nasal spray PLACE 1 SPRAY INTO BOTH NOSTRILS DAILY 11/16/16  Yes Dorena Cookey, MD  losartan (COZAAR) 100 MG tablet Take 1 tablet (100 mg total) by mouth 2 (two) times daily. 12/12/17  Yes Dorena Cookey, MD  OZEMPIC 1 MG/DOSE SOPN INJECT 1 MG INTO THE SKIN ONCE A WEEK. Patient taking differently: Inject 1 mg into the skin once a week.  12/05/17  Yes Renato Shin, MD  repaglinide (PRANDIN) 2 MG tablet TAKE 2 TABLETS BY MOUTH THREE TIMES DAILY BEFORE MEAL(S) Patient taking differently: Take 2 mg by mouth 3 (three) times daily before meals.  11/13/18  Yes Renato Shin, MD  simvastatin (ZOCOR) 40 MG tablet Take 1 tablet (40 mg total)  by mouth at bedtime. 12/12/17  Yes Dorena Cookey, MD  traMADol (ULTRAM) 50 MG tablet TAKE 1/2 TAB BY MOUTH TWICE A DAY 10/16/18  Yes Vivi Barrack, MD  glucose blood (ONE TOUCH ULTRA TEST) test strip USE TO TEST BLOOD USGAR TWICE A DAY 07/26/18   Renato Shin, MD    Current Facility-Administered Medications  Medication Dose Route Frequency Provider Last Rate Last Dose  . 0.9 %  sodium chloride infusion (Manually program via Guardrails IV Fluids)   Intravenous Once Lenore Cordia, MD 10 mL/hr at 11/14/18 0456    . 0.9 %  sodium chloride infusion (Manually program via Guardrails IV Fluids)   Intravenous Once Esterwood, Amy S, PA-C      . 0.9 %  sodium chloride infusion   Intravenous Continuous Lenore Cordia, MD 100 mL/hr at 11/14/18 0046    . acetaminophen (TYLENOL) tablet 650 mg  650 mg Oral Q6H PRN  Lenore Cordia, MD       Or  . acetaminophen (TYLENOL) suppository 650 mg  650 mg Rectal Q6H PRN Zada Finders R, MD      . insulin aspart (novoLOG) injection 0-9 Units  0-9 Units Subcutaneous Q4H Lenore Cordia, MD   5 Units at 11/14/18 0815  . ondansetron (ZOFRAN) tablet 4 mg  4 mg Oral Q6H PRN Lenore Cordia, MD       Or  . ondansetron (ZOFRAN) injection 4 mg  4 mg Intravenous Q6H PRN Zada Finders R, MD      . peg 3350 powder (MOVIPREP) kit 200 g  1 kit Oral Once Esterwood, Amy S, PA-C      . simvastatin (ZOCOR) tablet 40 mg  40 mg Oral q1800 Zada Finders R, MD      . sodium chloride flush (NS) 0.9 % injection 3 mL  3 mL Intravenous Q12H Lenore Cordia, MD   3 mL at 11/14/18 0113   Current Outpatient Medications  Medication Sig Dispense Refill  . acarbose (PRECOSE) 50 MG tablet Take 1 tablet (50 mg total) by mouth 3 (three) times daily with meals. 270 tablet 3  . allopurinol (ZYLOPRIM) 300 MG tablet Take 1 tablet (300 mg total) by mouth daily. 100 tablet 4  . amLODipine (NORVASC) 10 MG tablet Take 1 tablet (10 mg total) by mouth daily. 100 tablet 3  . aspirin 81 MG chewable tablet Chew 1 tablet (81 mg total) by mouth daily.    . bromocriptine (PARLODEL) 5 MG capsule TAKE 1 CAPSULE BY MOUTH EVERY DAY 90 capsule 1  . canagliflozin (INVOKANA) 300 MG TABS tablet Take 1 tablet (300 mg total) by mouth daily before breakfast. 30 tablet 11  . doxazosin (CARDURA) 8 MG tablet Take 1 tablet (8 mg total) by mouth at bedtime. 100 tablet 3  . fluticasone (FLONASE) 50 MCG/ACT nasal spray PLACE 1 SPRAY INTO BOTH NOSTRILS DAILY 32 g 10  . losartan (COZAAR) 100 MG tablet Take 1 tablet (100 mg total) by mouth 2 (two) times daily. 200 tablet 4  . OZEMPIC 1 MG/DOSE SOPN INJECT 1 MG INTO THE SKIN ONCE A WEEK. (Patient taking differently: Inject 1 mg into the skin once a week. ) 12 pen 3  . repaglinide (PRANDIN) 2 MG tablet TAKE 2 TABLETS BY MOUTH THREE TIMES DAILY BEFORE MEAL(S) (Patient taking  differently: Take 2 mg by mouth 3 (three) times daily before meals. ) 540 tablet 0  . simvastatin (ZOCOR) 40 MG tablet Take 1 tablet (40  mg total) by mouth at bedtime. 100 tablet 4  . traMADol (ULTRAM) 50 MG tablet TAKE 1/2 TAB BY MOUTH TWICE A DAY 100 tablet 1  . glucose blood (ONE TOUCH ULTRA TEST) test strip USE TO TEST BLOOD USGAR TWICE A DAY 100 each 3    Allergies as of 11/13/2018  . (No Known Allergies)    Family History  Problem Relation Age of Onset  . Cancer Mother   . Pancreatic cancer Mother   . Heart disease Father   . Heart attack Father   . Diabetes Other   . Hyperlipidemia Other   . Hypertension Other   . Rectal cancer Sister   . Colon cancer Neg Hx   . Esophageal cancer Neg Hx   . Stomach cancer Neg Hx   . Stroke Neg Hx   . Colon polyps Neg Hx     Social History   Socioeconomic History  . Marital status: Married    Spouse name: Not on file  . Number of children: Not on file  . Years of education: Not on file  . Highest education level: Not on file  Occupational History  . Occupation: Geographical information systems officer  . Financial resource strain: Not on file  . Food insecurity:    Worry: Not on file    Inability: Not on file  . Transportation needs:    Medical: Not on file    Non-medical: Not on file  Tobacco Use  . Smoking status: Former Smoker    Packs/day: 1.50    Years: 45.00    Pack years: 67.50    Types: Cigarettes    Last attempt to quit: 10/02/2010    Years since quitting: 8.1  . Smokeless tobacco: Never Used  Substance and Sexual Activity  . Alcohol use: Yes    Alcohol/week: 1.0 standard drinks    Types: 1 Shots of liquor per week  . Drug use: No  . Sexual activity: Yes  Lifestyle  . Physical activity:    Days per week: Not on file    Minutes per session: Not on file  . Stress: Not on file  Relationships  . Social connections:    Talks on phone: Not on file    Gets together: Not on file    Attends religious service: Not on file     Active member of club or organization: Not on file    Attends meetings of clubs or organizations: Not on file    Relationship status: Not on file  . Intimate partner violence:    Fear of current or ex partner: Not on file    Emotionally abused: Not on file    Physically abused: Not on file    Forced sexual activity: Not on file  Other Topics Concern  . Not on file  Social History Narrative   Regular exercise-no   Pt went to seminar to quit smoking    Review of Systems: 12 system ROS is negative except as noted above.   Physical Exam: Temp:  [97.7 F (36.5 C)-98.8 F (37.1 C)] 98.2 F (36.8 C) (12/18 0845) Pulse Rate:  [64-106] 69 (12/18 0900) Resp:  [11-22] 11 (12/18 0900) BP: (65-122)/(40-76) 102/44 (12/18 0900) SpO2:  [94 %-100 %] 100 % (12/18 0900)   General:   Alert,  well-nourished, pleasant and cooperative in NAD. Lying flat in the hospital.  Pale.  Head:  Normocephalic and atraumatic. Eyes:  Sclera clear, no icterus.   Conjunctiva pink. Ears:  Normal  auditory acuity. Nose:  No deformity, discharge, or lesions. Mouth:  No deformity or lesions.   Neck:  Supple; no masses or thyromegaly. Lungs:  Clear throughout to auscultation.   No wheezes. Heart:  Regular rate and rhythm; no murmurs. Abdomen:  Soft, central obesity, nontender, nondistended, normal bowel sounds, no rebound or guarding. No hepatosplenomegaly.   Rectal:  Deferred  Msk:  Symmetrical. No boney deformities LAD: No inguinal or umbilical LAD Extremities:  No clubbing or edema. Neurologic:  Alert and  oriented x4;  grossly nonfocal Skin:  Intact without significant lesions or rashes. Psych:  Alert and cooperative. Normal mood and affect.   Lab Results: Recent Labs    11/13/18 2048 11/13/18 2339  WBC 16.5*  --   HGB 12.0* 8.1*  HCT 37.2* 25.4*  PLT 174  --    BMET Recent Labs    11/13/18 2048  NA 140  K 3.5  CL 111  CO2 20*  GLUCOSE 141*  BUN 16  CREATININE 1.89*  CALCIUM 7.9*    LFT Recent Labs    11/13/18 2048  PROT 5.5*  ALBUMIN 3.4*  AST 27  ALT 22  ALKPHOS 64  BILITOT 0.7   PT/INR No results for input(s): LABPROT, INR in the last 72 hours. Hepatitis Panel No results for input(s): HEPBSAG, HCVAB, HEPAIGM, HEPBIGM in the last 72 hours.    Studies/Results: No results found.    Dontea Corlew L. Tarri Glenn, MD, MPH 11/14/2018, 9:35 AM

## 2018-11-14 NOTE — H&P (View-Only) (Signed)
Referring Provider: No ref. provider found Primary Care Physician:  Vivi Barrack, MD Primary Gastroenterologist:  Dr. Lucio Edward  Reason for Consultation: Post polypectomy bleeding  IMPRESSION:  Post-polypectomy bleed    - 28 polyps resected during a surveillance colonoscopy yesterday GI blood loss anemia with associated hypotension    - hgb 12.0->8.1  Multiple large polyps were resected during a surveillance colonoscopy yesterday. Developed hematochezia yesterday afternoon with associated hemodynamic instability.  The 25 mm cecal polyp would be the most likely to have bleed as it was removed piecemeal and removal was somewhat technically difficult due to part of the polyp located behind the IC valve.  Recommend tagged bleeding scan +/-angio today to localize the site of bleeding given his hemodynamic instability. If tagged scan is negative, will proceed with colonoscopy after a quick bowel prep. Colonoscopy tentatively scheduled with Dr. Rush Landmark this afternoon.   PLAN: NPO Maintain two large bore IVs Serial hgb/hct with transfusion as indicated - with repeat hemoglobin now PT/INR now Bleeding scan +/- angio if the bleeding scan is positive Plan for colonoscopy later today with a quick bowel prep in the event that the bleeding scan in negative  I consented the patient at the bedside today discussing the risks, benefits, and alternatives to endoscopic evaluation. In particular, we discussed the risks that include, but are not limited to, reaction to medication, cardiopulmonary compromise, bleeding requiring blood transfusion, aspiration resulting in pneumonia, perforation requiring surgery, and even death. We reviewed the risk of missed lesion including polyps or even cancer. The patient acknowledges these risks and asks that we proceed.   HPI: Bryan Haley is a 67 y.o. male seen in consultation at the request of Dr. Jonelle Sidle for further evaluation of hematochezia.  History  is obtained to the patient, review of his electronic health record, and his wife is present at the bedside.  He has coronary artery disease, diabetes, hypertension, hyperlipidemia, stage III chronic kidney disease, obstructive sleep apnea on CPAP, and obesity.  He has a history of colon polyps. Initial tubular adenoma removed on colonoscopy in 2002. Normal colonoscopy 2009.  Sessile serrated adenoma resected on surveillance colonoscopy 2014.    Bryan Haley had a surveillance colonoscopy yesterday.  Per Dr. Silvio Pate procedure note he had 5 colonic polyps.  Twenty-eight polyps were removed during the procedure.  The largest was a 25 mm polyp in the cecum that was removed with hot snare in a piecemeal fashion.  15 additional polyps ranged from 10 to 20 mm and were located throughout the transvere colon, ascending colon and cecum were resected using a hot snare.  11 smaller polyps were removed from the descending transverse and a sending colon with a cold snare.  6 polyps in the transverse colon were not removed due to ongoing spasm.  Dr. Fuller Plan also noted left-sided diverticulosis and grade 1 internal hemorrhoids.  He had no immediate complications and was discharged to home.  Yesterday afternoon he experienced 3 episodes of large-volume hematochezia associated with lightheadedness.  He was instructed by our on-call physician to go to the emergency room.  On arrival he was hemodynamically stable.  But after another large bloody bowel movement in the emergency room, he became hypotense.  His blood pressure has responded to fluids but he has overall remained hypotensive since that time.  He has had 2 additional large bloody bowel movements at approximately 4 AM and 630 this morning.  He currently feels weak and lightheaded.  No abdominal pain, abdominal cramping,  nausea, vomiting. No other associated symptoms. No identified exacerbating or relieving features.   Initial hemoglobin was 12, down to 8.1 last night.    Bryan Haley takes an 81 mg aspirin daily.  He denies use of any NSAIDs, antiplatelet agents, or anticoagulants.     Past Medical History:  Diagnosis Date  . Allergy   . Arthritis    feet  . COLONIC POLYPS, HX OF 10/02/2008  . Coronary artery disease   . Coronary atherosclerosis of native coronary artery 12/05/2013   Prox RCA, and PLA DES 12/04/13.   . Diabetic retinopathy of both eyes ([place])    Laser surgery, Dr. Zadie Rhine  . 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 Exertional shortness of breath    "last 2-3 yrs" (12/04/2013)  . GOUT 07/11/2007  . Hyperlipidemia   . HYPERTENSION 07/06/2007  . HYPOGONADISM 06/11/2010  . OSA (obstructive sleep apnea) 10/08/2009   wears CPAP "more than 1/2 the time"  . Other and unspecified angina pectoris   . RENAL INSUFFICIENCY 12/11/2008  . Sleep apnea    no cpap     Past Surgical History:  Procedure Laterality Date  . CARPAL TUNNEL RELEASE Left ~ 2004  . COLONOSCOPY    . CORONARY ANGIOPLASTY WITH STENT PLACEMENT  12/04/2013   "2 stents"  . EYE EXAMINATION UNDER ANESTHESIA W/ RETINAL CRYOTHERAPY AND RETINAL LASER Bilateral 2005-2010   "for my diabetes" (12/04/2013)  . LEFT HEART CATHETERIZATION WITH CORONARY ANGIOGRAM Bilateral 12/04/2013   Procedure: LEFT HEART CATHETERIZATION WITH CORONARY ANGIOGRAM;  Surgeon: Candee Furbish, MD;  Location: [facility] CATH LAB;  Service: Cardiovascular;  Laterality: Bilateral;  . POLYPECTOMY      Prior to Admission medications   Medication Sig Start Date End Date Taking? Authorizing Provider  acarbose (PRECOSE) 50 MG tablet Take 1 tablet (50 mg total) by mouth 3 (three) times daily with meals. 03/01/18  Yes Renato Shin, MD  allopurinol (ZYLOPRIM) 300 MG tablet Take 1 tablet (300 mg total) by mouth daily. 12/12/17  Yes Dorena Cookey, MD  amLODipine (NORVASC) 10 MG tablet Take 1 tablet (10 mg total) by mouth daily. 12/12/17  Yes Dorena Cookey, MD  aspirin 81 MG chewable tablet  Chew 1 tablet (81 mg total) by mouth daily. 12/05/13  Yes Lyda Jester M, PA-C  bromocriptine (PARLODEL) 5 MG capsule TAKE 1 CAPSULE BY MOUTH EVERY DAY 07/01/18  Yes Renato Shin, MD  canagliflozin [facility]) 300 MG TABS tablet Take 1 tablet (300 mg total) by mouth daily before breakfast. 07/26/18  Yes Renato Shin, MD  doxazosin (CARDURA) 8 MG tablet Take 1 tablet (8 mg total) by mouth at bedtime. 12/12/17  Yes Dorena Cookey, MD  fluticasone [facility]) 50 MCG/ACT nasal spray PLACE 1 SPRAY INTO BOTH NOSTRILS DAILY 11/16/16  Yes Dorena Cookey, MD  losartan (COZAAR) 100 MG tablet Take 1 tablet (100 mg total) by mouth 2 (two) times daily. 12/12/17  Yes Dorena Cookey, MD  OZEMPIC 1 MG/DOSE SOPN INJECT 1 MG INTO THE SKIN ONCE A WEEK. Patient taking differently: Inject 1 mg into the skin once a week.  12/05/17  Yes Renato Shin, MD  repaglinide (PRANDIN) 2 MG tablet TAKE 2 TABLETS BY MOUTH THREE TIMES DAILY BEFORE MEAL(S) Patient taking differently: Take 2 mg by mouth 3 (three) times daily before meals.  11/13/18  Yes Renato Shin, MD  simvastatin (ZOCOR) 40 MG tablet Take 1 tablet (40 mg total)  by mouth at bedtime. 12/12/17  Yes Dorena Cookey, MD  traMADol (ULTRAM) 50 MG tablet TAKE 1/2 TAB BY MOUTH TWICE A DAY 10/16/18  Yes Vivi Barrack, MD  glucose blood (ONE TOUCH ULTRA TEST) test strip USE TO TEST BLOOD USGAR TWICE A DAY 07/26/18   Renato Shin, MD    Current Facility-Administered Medications  Medication Dose Route Frequency Provider Last Rate Last Dose  . 0.9 %  sodium chloride infusion (Manually program via Guardrails IV Fluids)   Intravenous Once Lenore Cordia, MD 10 mL/hr at 11/14/18 0456    . 0.9 %  sodium chloride infusion (Manually program via Guardrails IV Fluids)   Intravenous Once E[place]wood, Amy S, PA-C      . 0.9 %  sodium chloride infusion   Intravenous Continuous Lenore Cordia, MD 100 mL/hr at 11/14/18 0046    . acetaminophen (TYLENOL) tablet 650 mg  650 mg Oral Q6H PRN  Lenore Cordia, MD       Or  . acetaminophen (TYLENOL) suppository 650 mg  650 mg Rectal Q6H PRN Zada Finders R, MD      . insulin aspart (novoLOG) injection 0-9 Units  0-9 Units Subcutaneous Q4H Lenore Cordia, MD   5 Units at 11/14/18 0815  . ondansetron (ZOFRAN) tablet 4 mg  4 mg Oral Q6H PRN Lenore Cordia, MD       Or  . ondansetron (ZOFRAN) injection 4 mg  4 mg Intravenous Q6H PRN Zada Finders R, MD      . peg 3350 powder (MOVIPREP) kit 200 g  1 kit Oral Once Esterwood, Amy S, PA-C      . simvastatin (ZOCOR) tablet 40 mg  40 mg Oral q1800 Zada Finders R, MD      . sodium chloride flush (NS) 0.9 % injection 3 mL  3 mL Intravenous Q12H Lenore Cordia, MD   3 mL at 11/14/18 0113   Current Outpatient Medications  Medication Sig Dispense Refill  . acarbose (PRECOSE) 50 MG tablet Take 1 tablet (50 mg total) by mouth 3 (three) times daily with meals. 270 tablet 3  . allopurinol (ZYLOPRIM) 300 MG tablet Take 1 tablet (300 mg total) by mouth daily. 100 tablet 4  . amLODipine (NORVASC) 10 MG tablet Take 1 tablet (10 mg total) by mouth daily. 100 tablet 3  . aspirin 81 MG chewable tablet Chew 1 tablet (81 mg total) by mouth daily.    . bromocriptine (PARLODEL) 5 MG capsule TAKE 1 CAPSULE BY MOUTH EVERY DAY 90 capsule 1  . canagliflozin (INVOKANA) 300 MG TABS tablet Take 1 tablet (300 mg total) by mouth daily before breakfast. 30 tablet 11  . doxazosin (CARDURA) 8 MG tablet Take 1 tablet (8 mg total) by mouth at bedtime. 100 tablet 3  . fluticasone (FLONASE) 50 MCG/ACT nasal spray PLACE 1 SPRAY INTO BOTH NOSTRILS DAILY 32 g 10  . losartan (COZAAR) 100 MG tablet Take 1 tablet (100 mg total) by mouth 2 (two) times daily. 200 tablet 4  . OZEMPIC 1 MG/DOSE SOPN INJECT 1 MG INTO THE SKIN ONCE A WEEK. (Patient taking differently: Inject 1 mg into the skin once a week. ) 12 pen 3  . repaglinide (PRANDIN) 2 MG tablet TAKE 2 TABLETS BY MOUTH THREE TIMES DAILY BEFORE MEAL(S) (Patient taking  differently: Take 2 mg by mouth 3 (three) times daily before meals. ) 540 tablet 0  . simvastatin (ZOCOR) 40 MG tablet Take 1 tablet (40  mg total) by mouth at bedtime. 100 tablet 4  . traMADol (ULTRAM) 50 MG tablet TAKE 1/2 TAB BY MOUTH TWICE A DAY 100 tablet 1  . glucose blood (ONE TOUCH ULTRA TEST) test strip USE TO TEST BLOOD USGAR TWICE A DAY 100 each 3    Allergies as of 11/13/2018  . (No Known Allergies)    Family History  Problem Relation Age of Onset  . Cancer Mother   . Pancreatic cancer Mother   . Heart disease Father   . Heart attack Father   . Diabetes Other   . Hyperlipidemia Other   . Hypertension Other   . Rectal cancer Sister   . Colon cancer Neg Hx   . Esophageal cancer Neg Hx   . Stomach cancer Neg Hx   . Stroke Neg Hx   . Colon polyps Neg Hx     Social History   Socioeconomic History  . Marital status: Married    Spouse name: Not on file  . Number of children: Not on file  . Years of education: Not on file  . Highest education level: Not on file  Occupational History  . Occupation: Geographical information systems officer  . Financial resource strain: Not on file  . Food insecurity:    Worry: Not on file    Inability: Not on file  . Transportation needs:    Medical: Not on file    Non-medical: Not on file  Tobacco Use  . Smoking status: Former Smoker    Packs/day: 1.50    Years: 45.00    Pack years: 67.50    Types: Cigarettes    Last attempt to quit: 10/02/2010    Years since quitting: 8.1  . Smokeless tobacco: Never Used  Substance and Sexual Activity  . Alcohol use: Yes    Alcohol/week: 1.0 standard drinks    Types: 1 Shots of liquor per week  . Drug use: No  . Sexual activity: Yes  Lifestyle  . Physical activity:    Days per week: Not on file    Minutes per session: Not on file  . Stress: Not on file  Relationships  . Social connections:    Talks on phone: Not on file    Gets together: Not on file    Attends religious service: Not on file     Active member of club or organization: Not on file    Attends meetings of clubs or organizations: Not on file    Relationship status: Not on file  . Intimate partner violence:    Fear of current or ex partner: Not on file    Emotionally abused: Not on file    Physically abused: Not on file    Forced sexual activity: Not on file  Other Topics Concern  . Not on file  Social History Narrative   Regular exercise-no   Pt went to seminar to quit smoking    Review of Systems: 12 system ROS is negative except as noted above.   Physical Exam: Temp:  [97.7 F (36.5 C)-98.8 F (37.1 C)] 98.2 F (36.8 C) (12/18 0845) Pulse Rate:  [64-106] 69 (12/18 0900) Resp:  [11-22] 11 (12/18 0900) BP: (65-122)/(40-76) 102/44 (12/18 0900) SpO2:  [94 %-100 %] 100 % (12/18 0900)   General:   Alert,  well-nourished, pleasant and cooperative in NAD. Lying flat in the hospital.  Pale.  Head:  Normocephalic and atraumatic. Eyes:  Sclera clear, no icterus.   Conjunctiva pink. Ears:  Normal  auditory acuity. Nose:  No deformity, discharge, or lesions. Mouth:  No deformity or lesions.   Neck:  Supple; no masses or thyromegaly. Lungs:  Clear throughout to auscultation.   No wheezes. Heart:  Regular rate and rhythm; no murmurs. Abdomen:  Soft, central obesity, nontender, nondistended, normal bowel sounds, no rebound or guarding. No hepatosplenomegaly.   Rectal:  Deferred  Msk:  Symmetrical. No boney deformities LAD: No inguinal or umbilical LAD Extremities:  No clubbing or edema. Neurologic:  Alert and  oriented x4;  grossly nonfocal Skin:  Intact without significant lesions or rashes. Psych:  Alert and cooperative. Normal mood and affect.   Lab Results: Recent Labs    11/13/18 2048 11/13/18 2339  WBC 16.5*  --   HGB 12.0* 8.1*  HCT 37.2* 25.4*  PLT 174  --    BMET Recent Labs    11/13/18 2048  NA 140  K 3.5  CL 111  CO2 20*  GLUCOSE 141*  BUN 16  CREATININE 1.89*  CALCIUM 7.9*    LFT Recent Labs    11/13/18 2048  PROT 5.5*  ALBUMIN 3.4*  AST 27  ALT 22  ALKPHOS 64  BILITOT 0.7   PT/INR No results for input(s): LABPROT, INR in the last 72 hours. Hepatitis Panel No results for input(s): HEPBSAG, HCVAB, HEPAIGM, HEPBIGM in the last 72 hours.    Studies/Results: No results found.    Maijor Hornig L. Tarri Glenn, MD, MPH 11/14/2018, 9:35 AM

## 2018-11-14 NOTE — ED Notes (Signed)
MD notified with latest BP.

## 2018-11-14 NOTE — Progress Notes (Signed)
ED TO INPATIENT HANDOFF REPORT  Name/Age/Gender Bryan Haley 67 y.o. male  Code Status    Code Status Orders  (From admission, onward)         Start     Ordered   11/13/18 2337  Full code  Continuous     11/13/18 2338        Code Status History    Date Active Date Inactive Code Status Order ID Comments User Context   12/22/2014 1942 12/24/2014 1359 Full Code [id number]  Velvet Bathe, MD Inpatient      Home/SNF/Other Home  Chief Complaint Rectal bleeding   Level of Care/Admitting Diagnosis ED Disposition    ED Disposition Condition [facility] Area: [facility] [[id number]]  Level of Care: Stepdown [14]  Admit to SDU based on following criteria: Hemodynamic compromise or significant risk of instability:  Patient requiring short term acute titration and management of vasoactive drips, and invasive monitoring (i.e., CVP and Arterial line).  Diagnosis: GI bleed [163846]  Admitting Physician: Elwyn Reach [[id number]]  Attending Physician: Elwyn Reach [[id number]]  Estimated length of stay: past midnight tomorrow  Certification:: I certify this patient will need inpatient services for at least 2 midnights  PT Class (Do Not Modify): Inpatient [101]  PT Acc Code (Do Not Modify): Private [1]       Medical History Past Medical History:  Diagnosis Date  . Allergy   . Arthritis    feet  . COLONIC POLYPS, HX OF 10/02/2008  . Coronary artery disease   . Coronary atherosclerosis of native coronary artery 12/05/2013   Prox RCA, and PLA DES 12/04/13.   . Diabetic retinopathy of both eyes ([place])    Laser surgery, Dr. Zadie Rhine  . 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 Exertional shortness of breath    "last 2-3 yrs" (12/04/2013)  . GOUT 07/11/2007  . Hyperlipidemia   . HYPERTENSION 07/06/2007  . HYPOGONADISM 06/11/2010  . OSA (obstructive sleep apnea) 10/08/2009   wears CPAP "more than 1/2 the  time"  . Other and unspecified angina pectoris   . RENAL INSUFFICIENCY 12/11/2008  . Sleep apnea    no cpap     Allergies No Known Allergies  IV Location/Drains/Wounds Patient Lines/Drains/Airways Status   Active Line/Drains/Airways    Name:   Placement date:   Placement time:   Site:   Days:   Peripheral IV 11/13/18 Left;Anterior;Upper Forearm   11/13/18    2038    Forearm   1   Peripheral IV 11/13/18 Right;Anterior Forearm   11/13/18    2041    Forearm   1          Labs/Imaging Results for orders placed or performed during the hospital encounter of 11/13/18 (from the past 48 hour(s))  ABO/Rh     Status: None   Collection Time: 11/13/18  8:45 PM  Result Value Ref Range   ABO/RH(D)      Jenetta Downer NEG Performed at [facility] [address], [place], [place] [address]   Comprehensive metabolic panel     Status: Abnormal   Collection Time: 11/13/18  8:48 PM  Result Value Ref Range   Sodium 140 135 - 145 mmol/L   Potassium 3.5 3.5 - 5.1 mmol/L   Chloride 111 98 - 111 mmol/L   CO2 20 (L) 22 - 32 mmol/L   Glucose, Bld 141 (  H) 70 - 99 mg/dL   BUN 16 8 - 23 mg/dL   Creatinine, Ser 1.89 (H) 0.61 - 1.24 mg/dL   Calcium 7.9 (L) 8.9 - 10.3 mg/dL   Total Protein 5.5 (L) 6.5 - 8.1 g/dL   Albumin 3.4 (L) 3.5 - 5.0 g/dL   AST 27 15 - 41 U/L   ALT 22 0 - 44 U/L   Alkaline Phosphatase 64 38 - 126 U/L   Total Bilirubin 0.7 0.3 - 1.2 mg/dL   GFR calc non Af Amer 36 (L) >60 mL/min   GFR calc Af Amer 41 (L) >60 mL/min   Anion gap 9 5 - 15    Comment: Performed at [facility], [place] [address], [place], [place] [address]  CBC     Status: Abnormal   Collection Time: 11/13/18  8:48 PM  Result Value Ref Range   WBC 16.5 (H) 4.0 - 10.5 K/uL   RBC 3.84 (L) 4.22 - 5.81 MIL/uL   Hemoglobin 12.0 (L) 13.0 - 17.0 g/dL   HCT 37.2 (L) 39.0 - 52.0 %   MCV 96.9 80.0 - 100.0 fL   MCH 31.3 26.0 - 34.0 pg   MCHC 32.3 30.0 - 36.0 g/dL   RDW 14.2 11.5 - 15.5 %    Platelets 174 150 - 400 K/uL   nRBC 0.0 0.0 - 0.2 %    Comment: Performed at [facility], [place] [address], [place], [place] [address]  Type and screen [place]     Status: None (Preliminary result)   Collection Time: 11/13/18  8:48 PM  Result Value Ref Range   ABO/RH(D) O NEG    Antibody Screen NEG    Sample Expiration 11/16/2018    Unit Number [id number]    Blood Component Type RED CELLS,LR    Unit division 00    Status of Unit ISSUED    Transfusion Status OK TO TRANSFUSE    Crossmatch Result COMPATIBLE    Unit tag comment VERBAL ORDERS PER DR [place]    Unit Number [id number]    Blood Component Type RED CELLS,LR    Unit division 00    Status of Unit ISSUED    Transfusion Status OK TO TRANSFUSE    Crossmatch Result COMPATIBLE    Unit tag comment VERBAL ORDERS PER DR PATRICK    Unit Number [id number]    Blood Component Type RED CELLS,LR    Unit division 00    Status of Unit ALLOCATED    Transfusion Status OK TO TRANSFUSE    Crossmatch Result COMPATIBLE    Unit Number [id number]    Blood Component Type RED CELLS,LR    Unit division 00    Status of Unit ALLOCATED    Transfusion Status OK TO TRANSFUSE    Crossmatch Result COMPATIBLE   Hemoglobin and hematocrit, blood     Status: Abnormal   Collection Time: 11/13/18 11:39 PM  Result Value Ref Range   Hemoglobin 8.1 (L) 13.0 - 17.0 g/dL    Comment: REPEATED TO VERIFY DELTA CHECK NOTED    HCT 25.4 (L) 39.0 - 52.0 %    Comment: Performed at [facility], [place] [address] [place], [place] [address]  CBG monitoring, ED     Status: Abnormal   Collection Time: 11/14/18 12:31 AM  Result Value Ref Range   Glucose-Capillary 249 (H) 70 - 99 mg/dL  Prepare RBC     Status: None   Collection Time:  11/14/18  1:45 AM  Result Value Ref Range   Order Confirmation      ORDER PROCESSED BY BLOOD BANK Performed at [facility], [place] [address]  [place], [place] [address]   CBG monitoring, ED     Status: Abnormal   Collection Time: 11/14/18  4:39 AM  Result Value Ref Range   Glucose-Capillary 327 (H) 70 - 99 mg/dL   Comment 1 Notify RN   CBG monitoring, ED     Status: Abnormal   Collection Time: 11/14/18  8:00 AM  Result Value Ref Range   Glucose-Capillary 266 (H) 70 - 99 mg/dL  Prepare RBC     Status: None   Collection Time: 11/14/18  9:33 AM  Result Value Ref Range   Order Confirmation      ORDER PROCESSED BY BLOOD BANK Performed at [facility], [place] [address], [place], [place] [address]   Hemoglobin and hematocrit, blood     Status: Abnormal   Collection Time: 11/14/18  9:53 AM  Result Value Ref Range   Hemoglobin 7.4 (L) 13.0 - 17.0 g/dL   HCT 22.8 (L) 39.0 - 52.0 %    Comment: Performed at [facility], [place] [address], [place], [place] [address]  CBC     Status: Abnormal   Collection Time: 11/14/18 10:54 AM  Result Value Ref Range   WBC 10.2 4.0 - 10.5 K/uL   RBC 2.35 (L) 4.22 - 5.81 MIL/uL   Hemoglobin 7.3 (L) 13.0 - 17.0 g/dL   HCT 23.0 (L) 39.0 - 52.0 %   MCV 97.9 80.0 - 100.0 fL   MCH 31.1 26.0 - 34.0 pg   MCHC 31.7 30.0 - 36.0 g/dL   RDW 14.4 11.5 - 15.5 %   Platelets 117 (L) 150 - 400 K/uL    Comment: Immature Platelet Fraction may be clinically indicated, consider ordering this additional test [id number]    nRBC 0.0 0.0 - 0.2 %    Comment: Performed at [facility], [place] [address], [place], [place] [address]  Protime-INR Once     Status: None   Collection Time: 11/14/18 10:54 AM  Result Value Ref Range   Prothrombin Time 14.9 11.4 - 15.2 seconds   INR 1.18     Comment: Performed at [facility], [place] [address] [place],  [address]   No results found.  Pending Labs [employer] (From admission, onward)    Start     Ordered   11/14/18 1113  Prepare RBC  (Adult Blood Administration - Red Blood Cells)  Once,   R     Question Answer Comment  # of Units 2 units   Transfusion Indications Actively Bleeding / GI Bleed   Number of Units to Keep Ahead 2 units ahead   If emergent release call blood bank Not emergent release   Instructions: Transfuse      11/14/18 1117   11/14/18 0930  Hemoglobin and hematocrit, blood  Now then every 6 hours,   R    Comments:  Just start q 6 hours from last hgb - call MD for hgb 8 or less    11/14/18 0930   11/14/18 5003  Basic metabolic panel  Tomorrow morning,   R     11/13/18 2338   11/13/18 2338  HIV antibody (Routine Testing)  Add-on,   R     11/13/18 2338          Vitals/Pain  Today's Vitals   11/14/18 0900 11/14/18 0930 11/14/18 1000 11/14/18 1030  BP: (!) 102/44 (!) 105/43 (!) 87/71 (!) 91/41  Pulse: 69 68 81 69  Resp: 11 16 (!) 22 15  Temp:      TempSrc:      SpO2: 100% 100% 100% 100%  PainSc:        Isolation Precautions No active isolations  Medications Medications  sodium chloride flush (NS) 0.9 % injection 3 mL (3 mLs Intravenous Given 11/14/18 0113)  0.9 %  sodium chloride infusion ( Intravenous Not Given 11/14/18 0921)  acetaminophen (TYLENOL) tablet 650 mg (has no administration in time range)    Or  acetaminophen (TYLENOL) suppository 650 mg (has no administration in time range)  ondansetron (ZOFRAN) tablet 4 mg (has no administration in time range)    Or  ondansetron (ZOFRAN) injection 4 mg (has no administration in time range)  simvastatin (ZOCOR) tablet 40 mg (has no administration in time range)  insulin aspart (novoLOG) injection 0-9 Units (5 Units Subcutaneous Given 11/14/18 0815)  0.9 %  sodium chloride infusion (Manually program via Guardrails IV Fluids) ( Intravenous Rate/Dose Change 11/14/18 0456)  peg 3350 powder (MOVIPREP) kit 200 g (has no administration in time range)  0.9 %  sodium chloride infusion (Manually program via Guardrails IV Fluids) (has no administration in time range)  0.9 %  sodium chloride infusion  (Manually program via Guardrails IV Fluids) (has no administration in time range)  sodium chloride 0.9 % bolus 1,000 mL (0 mLs Intravenous Stopped 11/13/18 2203)  sodium chloride 0.9 % bolus 1,000 mL (0 mLs Intravenous Stopped 11/14/18 0454)  sodium chloride 0.9 % bolus 500 mL (0 mLs Intravenous Stopped 11/14/18 0855)  0.9 %  sodium chloride infusion ( Intravenous New Bag/Given 11/14/18 0923)    Mobility walks

## 2018-11-14 NOTE — ED Notes (Signed)
He is taken to Nuclear [facility]. After Main Lab phlebotomist draws his blood. He remains drowsy and in no distress. He has been mildly confused. His family (two females) have been with him most of the morning.

## 2018-11-14 NOTE — Telephone Encounter (Signed)
Pt hospitalized at [facility]. He has been evaluated by the inpatient GI team with plans for colonoscopy today.

## 2018-11-14 NOTE — ED Notes (Signed)
Pt. Had another episode of rectal bleeding in large amount with blood clots. Full linen changed. Charge Nurse aware of pt.'s latest V/S.

## 2018-11-14 NOTE — Progress Notes (Signed)
Brief Procedure Note Provation note not working currently. Procedure started at 539 and ended at 724. Fentanyl 200 Versed 8 Benadryl 50. After an hour he became disinhibited and required restraining by our staff and then by the ICU staff.  Cecum reached after approximately 20 minutes. Blood and clots throughout colon. Multiple ulcers in areas of previous snare polypectomy were noted. Total of 29 clips placed on different regions. 2 ulcer had visible vessels that required EPI and Gold Probe and Clipping. Full details will be in the Provation note.   Plan for clear liquids today into tomorrow. Trend Hgb/Hct Q6H Will decide tomorrow if he needs another preparation for full colonoscopy on Friday. Hopeful this is not required. We will follow in the AM.   Justice Britain, MD [facility] Advanced Endoscopy Office # [phone]

## 2018-11-15 ENCOUNTER — Encounter (HOSPITAL_COMMUNITY): Payer: Self-pay | Admitting: Gastroenterology

## 2018-11-15 ENCOUNTER — Inpatient Hospital Stay (HOSPITAL_COMMUNITY): Payer: Managed Care, Other (non HMO)

## 2018-11-15 ENCOUNTER — Ambulatory Visit: Payer: Managed Care, Other (non HMO) | Admitting: Cardiology

## 2018-11-15 DIAGNOSIS — I2510 Atherosclerotic heart disease of native coronary artery without angina pectoris: Secondary | ICD-10-CM

## 2018-11-15 DIAGNOSIS — K921 Melena: Secondary | ICD-10-CM

## 2018-11-15 DIAGNOSIS — G4733 Obstructive sleep apnea (adult) (pediatric): Secondary | ICD-10-CM

## 2018-11-15 DIAGNOSIS — I10 Essential (primary) hypertension: Secondary | ICD-10-CM

## 2018-11-15 DIAGNOSIS — I441 Atrioventricular block, second degree: Secondary | ICD-10-CM

## 2018-11-15 LAB — BASIC METABOLIC PANEL
Anion gap: 10 (ref 5–15)
BUN: 26 mg/dL — ABNORMAL HIGH (ref 8–23)
CALCIUM: 7.3 mg/dL — AB (ref 8.9–10.3)
CO2: 18 mmol/L — ABNORMAL LOW (ref 22–32)
Chloride: 112 mmol/L — ABNORMAL HIGH (ref 98–111)
Creatinine, Ser: 2.71 mg/dL — ABNORMAL HIGH (ref 0.61–1.24)
GFR calc Af Amer: 27 mL/min — ABNORMAL LOW (ref >60)
GFR calc non Af Amer: 23 mL/min — ABNORMAL LOW (ref >60)
Glucose, Bld: 221 mg/dL — ABNORMAL HIGH (ref 70–99)
Potassium: 3.7 mmol/L (ref 3.5–5.1)
Sodium: 140 mmol/L (ref 135–145)

## 2018-11-15 LAB — ECHOCARDIOGRAM COMPLETE
Height: 70 in
WEIGHTICAEL: 3992.97 [oz_av]

## 2018-11-15 LAB — MAGNESIUM: Magnesium: 1.9 mg/dL (ref 1.7–2.4)

## 2018-11-15 LAB — GLUCOSE, CAPILLARY
Glucose-Capillary: 119 mg/dL — ABNORMAL HIGH (ref 70–99)
Glucose-Capillary: 141 mg/dL — ABNORMAL HIGH (ref 70–99)
Glucose-Capillary: 210 mg/dL — ABNORMAL HIGH (ref 70–99)
Glucose-Capillary: 162 mg/dL — ABNORMAL HIGH (ref 70–99)
Glucose-Capillary: 168 mg/dL — ABNORMAL HIGH (ref 70–99)
Glucose-Capillary: 140 mg/dL — ABNORMAL HIGH (ref 70–99)

## 2018-11-15 LAB — HIV ANTIBODY (ROUTINE TESTING W REFLEX): HIV SCREEN 4TH GENERATION: NONREACTIVE

## 2018-11-15 LAB — HEMOGLOBIN AND HEMATOCRIT, BLOOD
HCT: 26 % — ABNORMAL LOW (ref 39.0–52.0)
HCT: 28.7 % — ABNORMAL LOW (ref 39.0–52.0)
HCT: 24.6 % — ABNORMAL LOW (ref 39.0–52.0)
HCT: 23.7 % — ABNORMAL LOW (ref 39.0–52.0)
HEMOGLOBIN: 8.5 g/dL — AB (ref 13.0–17.0)
HEMOGLOBIN: 9.1 g/dL — AB (ref 13.0–17.0)
Hemoglobin: 7.9 g/dL — ABNORMAL LOW (ref 13.0–17.0)
Hemoglobin: 7.7 g/dL — ABNORMAL LOW (ref 13.0–17.0)

## 2018-11-15 LAB — TSH: TSH: 3.206 u[IU]/mL (ref 0.350–4.500)

## 2018-11-15 MED ORDER — ATORVASTATIN CALCIUM 10 MG PO TABS
20.0000 mg | ORAL_TABLET | Freq: Every day | ORAL | Status: DC
  Administered 2018-11-15 – 2018-11-16 (×2): 20 mg via ORAL
  Filled 2018-11-15 (×2): qty 2

## 2018-11-15 MED ORDER — SODIUM CHLORIDE 0.9 % IV SOLN
INTRAVENOUS | Status: DC
  Administered 2018-11-15: 14:00:00 via INTRAVENOUS

## 2018-11-15 MED ORDER — AMLODIPINE BESYLATE 5 MG PO TABS
2.5000 mg | ORAL_TABLET | Freq: Every day | ORAL | Status: DC
  Administered 2018-11-16: 2.5 mg via ORAL
  Filled 2018-11-15: qty 1

## 2018-11-15 MED ORDER — ISOSORBIDE MONONITRATE ER 30 MG PO TB24
30.0000 mg | ORAL_TABLET | Freq: Every day | ORAL | Status: DC
  Administered 2018-11-15 – 2018-11-16 (×2): 30 mg via ORAL
  Filled 2018-11-15 (×2): qty 1

## 2018-11-15 MED ORDER — ORAL CARE MOUTH RINSE
15.0000 mL | Freq: Two times a day (BID) | OROMUCOSAL | Status: DC
  Administered 2018-11-16: 15 mL via OROMUCOSAL

## 2018-11-15 NOTE — Progress Notes (Signed)
Patient ID: Bryan Haley, male   DOB: 01/19/1951, 67 y.o.   MRN: [id number]   Brief GI note -  Had discussed possible discharge for this afternoon .  Pt has done well today , brown stool Cardiology consult for arrhythmia - felt to be  Intermittent Mobitz type 1 AV block.- no further w/u planned. PT with CAD , s/p prior stents   Hgb just resulted - down to 7.9 Suspect still equilibrating - however not comfortable sending him home this evening - need HGB to be stable, will watch overnight and hopefully can be d/c in am  Will plan f/u CBC Monday 12/23 at office , and office f/u with Dr Fuller Plan in  2 weeks  Reviewed with pt and wife regarding restricted activity x 2 weeks ,no NSAIDS, may resume  Baby ASA in 5 days

## 2018-11-15 NOTE — Progress Notes (Signed)
The order for simvastatin(Zocor) was changed to an equivalent dose of atorvastatin(Lipitor) due to the potential drug interaction with amlodipine.  When taken in combination with medications that inhibit its metabolism, simvastatin can accumulate which increases the risk of liver toxicity, myopathy, or rhabdomyolysis.  Simvastatin dose should not exceed 10mg /day in patients taking verapamil, diltiazem, fibrates, or niacin >or= 1g/day.   Simvastatin dose should not exceed 20mg /day in patients taking amlodipine, ranolazine or amiodarone.   Please consider this potential interaction at discharge.  Bryan Haley A 11/15/2018 1:46 PM

## 2018-11-15 NOTE — Progress Notes (Signed)
1232. Pt. heart rhythm fluctuating. Just had a 2.63 sec pause, has brady down into 30's nonsustained. Now has 1st degree HB since my shift. MD made aware.

## 2018-11-15 NOTE — Progress Notes (Signed)
Notified by telemetry that patient had a 6 beat run of V Tach. MD made aware.

## 2018-11-15 NOTE — Progress Notes (Signed)
Pt declined use of cpap tonight.  Pt stated he has not wore one in years and will be fine.  Pt was advised that RT is available all night and encouraged him to call, should he change his mind.  RN aware.

## 2018-11-15 NOTE — Progress Notes (Signed)
Patient ID: Bryan Haley, male   DOB: 22-Apr-1951, 67 y.o.   MRN: [id number]    Progress Note   Subjective   Up in chair, feels good - wants to go home. No c/o pain. No stools or blood overnight  HGB 9.7 post transfusions   Objective   Vital signs in last 24 hours: Temp:  [98.1 F (36.7 C)-99.1 F (37.3 C)] 98.8 F (37.1 C) (12/19 0353) Pulse Rate:  [64-109] 72 (12/19 0600) Resp:  [0-24] 16 (12/19 0600) BP: (78-158)/(25-85) 142/48 (12/19 0600) SpO2:  [92 %-100 %] 97 % (12/19 0600) Weight:  [112.7 kg-113.2 kg] 113.2 kg (12/19 0353) Last BM Date: 11/14/18 General:    white male in NAD Heart:  Regular rate and rhythm; no murmurs Lungs: Respirations even and unlabored, lungs CTA bilaterally Abdomen:  Soft, nontender and nondistended. Normal bowel sounds. Extremities:  Without edema. Neurologic:  Alert and oriented,  grossly normal neurologically. Psych:  Cooperative. Normal mood and affect.  Intake/Output from previous day: 12/18 0701 - 12/19 0700 In: 6270 [P.O.:960; I.V.:500] Out: 1 [Urine:1] Intake/Output this shift: No intake/output data recorded.  Lab Results: Recent Labs    11/13/18 2048  11/14/18 1054 11/14/18 1436 11/14/18 2201 11/15/18 0327  WBC 16.5*  --  10.2  --   --   --   HGB 12.0*   < > 7.3* 7.0* 9.7* 8.5*  HCT 37.2*   < > 23.0* 21.2* 29.9* 26.0*  PLT 174  --  117*  --   --   --    < > = values in this interval not displayed.   BMET Recent Labs    11/13/18 2048 11/14/18 1054  NA 140 142  K 3.5 4.0  CL 111 117*  CO2 20* 17*  GLUCOSE 141* 233*  BUN 16 27*  CREATININE 1.89* 3.45*  CALCIUM 7.9* 6.5*   LFT Recent Labs    11/13/18 2048  PROT 5.5*  ALBUMIN 3.4*  AST 27  ALT 22  ALKPHOS 64  BILITOT 0.7   PT/INR Recent Labs    11/14/18 1054  LABPROT 14.9  INR 1.18    Studies/Results: Nm Gi Blood Loss  Result Date: 11/14/2018 CLINICAL DATA:  67 year old GI bleed.  Evaluate for bleeding source. EXAM: NUCLEAR MEDICINE  GASTROINTESTINAL BLEEDING SCAN TECHNIQUE: Sequential abdominal images were obtained following intravenous administration of Tc-54mlabeled red blood cells. RADIOPHARMACEUTICALS:  Twenty-four mCi Tc-968mertechnetate in-vitro labeled red cells. COMPARISON:  None. FINDINGS: Study has technical limitations due to patient motion throughout the examination. Small focus of uptake in the right lower quadrant does not move and finding is indeterminate. Large amount of penile uptake. Uptake in the expected vascular structures. IMPRESSION: Technically challenging examination without a clear source for GI bleeding. Small focus of uptake in the right lower quadrant is indeterminate. Electronically Signed   By: AdMarkus Daft.D.   On: 11/14/2018 14:30       Assessment / Plan:     #! [age]o male admitted 12/17 with acute major post polypectomy bleed, after undergoing outpt Colonoscopy on 12/16 with removal of 28 polyps. Largest polyp in the cecum most likley culprit.  Probable polyposis syndrome .  Pt  Hypotensive yesterday am , after multiple bloody stools and hgb drop 12- 7.1. Bleeding scan negative/indeterminant  Colonoscopy last pm with several polypectomy site ulcers- 2 with visible vessels- bleeding was controlled after placement of 29 clips , epi and bicap  Of multiple polyp sites  Pt has been quite  stable overnight- no bleeding- hgb 9.7  #2  AKI - creat 3.4 yesterday -  Has had a lot of volume since - BMET pending today   #3 HTN  #4 CAD     Plan ; Full liquid diet  Serial hgbs today - will re-evaluate this afternoon  For possible discharge this evening - pt has closing on his house tomorrow , understands  he is still at risk for re- bleeding.  If re- bleeds today - re-prep and Colon with MAC tomorrow.           Principal Problem:   Hematochezia Active Problems:   Dyslipidemia associated with type 2 diabetes mellitus ([place])   Hypertension associated with diabetes ([place])   CKD stage 3  due to type 2 diabetes mellitus ([place])   Sleep apnea   COLONIC POLYPS, HX OF   CAD (coronary artery disease)   Diabetes ([place])   GI bleed     LOS: 2 days   Genee Rann  11/15/2018, 8:59 AM

## 2018-11-15 NOTE — Progress Notes (Signed)
  Echocardiogram 2D Echocardiogram has been performed.  Bryan Haley 11/15/2018, 3:42 PM

## 2018-11-15 NOTE — Progress Notes (Signed)
Pt has had BM- no blood noted in stools.

## 2018-11-15 NOTE — Consult Note (Signed)
Cardiology Consultation:   Patient ID: Bryan Haley; [id number]; Jun 09, 1951   Admit date: 11/13/2018 Date of Consult: 11/15/2018  Primary Care Provider: Vivi Barrack, MD Primary Cardiologist: Candee Furbish, MD Primary Electrophysiologist:  None   Patient Profile:   Bryan Haley is a 67 y.o. male with a PMH of CAD s/p PCI/DES to prox PLA and pRCA in 2015, HTN, HLD, DM type 2, OSA on CPAP, and CKD stage 3, who is being seen today for the evaluation of arrhythmia on telemetry at the request of Dr. Denton Brick.  History of Present Illness:   [name] presented for outpatient colonoscopy and had significant hematochezia after removal of 27 polyps. He had associated hypotension and lightheadedness which improved with IVFs. He was recommended for admission to medicine for close observation of blood counts. His Hgb dropped to 7.1 at lowest point (baseline 12 prior to procedure) and he was given 4 uPRBC total with improvement in Hgb to 9.1. During his hospitalization he was noted to have significant arrhythmias on telemetry, for which cardiology was consulted.   At the time of this evaluation he reports feeling much better than yesterday. He denies any chest pain, SOB, palpitations, dizziness, lightheadedness, or syncope.      Past Medical History:  Diagnosis Date  . Allergy   . Arthritis    feet  . COLONIC POLYPS, HX OF 10/02/2008  . Coronary artery disease   . Coronary atherosclerosis of native coronary artery 12/05/2013   Prox RCA, and PLA DES 12/04/13.   . Diabetic retinopathy of both eyes ([place])    Laser surgery, Dr. Zadie Rhine  . 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 Exertional shortness of breath    "last 2-3 yrs" (12/04/2013)  . GOUT 07/11/2007  . Hyperlipidemia   . HYPERTENSION 07/06/2007  . HYPOGONADISM 06/11/2010  . OSA (obstructive sleep apnea) 10/08/2009   wears CPAP "more than 1/2 the time"  . Other and unspecified angina  pectoris   . RENAL INSUFFICIENCY 12/11/2008  . Sleep apnea    no cpap     Past Surgical History:  Procedure Laterality Date  . CARPAL TUNNEL RELEASE Left ~ 2004  . COLONOSCOPY    . COLONOSCOPY WITH PROPOFOL N/A 11/14/2018   Procedure: COLONOSCOPY WITH PROPOFOL;  Surgeon: Rush Landmark Telford Nab., MD;  Location: [facility];  Service: Gastroenterology;  Laterality: N/A;  . CORONARY ANGIOPLASTY WITH STENT PLACEMENT  12/04/2013   "2 stents"  . EYE EXAMINATION UNDER ANESTHESIA W/ RETINAL CRYOTHERAPY AND RETINAL LASER Bilateral 2005-2010   "for my diabetes" (12/04/2013)  . HOT HEMOSTASIS N/A 11/14/2018   Procedure: HOT HEMOSTASIS (ARGON PLASMA COAGULATION/BICAP);  Surgeon: Irving Copas., MD;  Location: [facility];  Service: Gastroenterology;  Laterality: N/A;  . LEFT HEART CATHETERIZATION WITH CORONARY ANGIOGRAM Bilateral 12/04/2013   Procedure: LEFT HEART CATHETERIZATION WITH CORONARY ANGIOGRAM;  Surgeon: Candee Furbish, MD;  Location: [facility] CATH LAB;  Service: Cardiovascular;  Laterality: Bilateral;  . POLYPECTOMY    . SUBMUCOSAL INJECTION  11/14/2018   Procedure: SUBMUCOSAL INJECTION;  Surgeon: Rush Landmark Telford Nab., MD;  Location: [facility];  Service: Gastroenterology;;     Home Medications:  Prior to Admission medications   Medication Sig Start Date End Date Taking? Authorizing Provider  acarbose (PRECOSE) 50 MG tablet Take 1 tablet (50 mg total) by mouth 3 (three) times daily with meals. 03/01/18  Yes Renato Shin, MD  allopurinol (ZYLOPRIM) 300 MG tablet Take  1 tablet (300 mg total) by mouth daily. 12/12/17  Yes Dorena Cookey, MD  amLODipine (NORVASC) 10 MG tablet Take 1 tablet (10 mg total) by mouth daily. 12/12/17  Yes Dorena Cookey, MD  aspirin 81 MG chewable tablet Chew 1 tablet (81 mg total) by mouth daily. 12/05/13  Yes Lyda Jester M, PA-C  bromocriptine (PARLODEL) 5 MG capsule TAKE 1 CAPSULE BY MOUTH EVERY DAY 07/01/18  Yes Renato Shin, MD  canagliflozin [facility])  300 MG TABS tablet Take 1 tablet (300 mg total) by mouth daily before breakfast. 07/26/18  Yes Renato Shin, MD  doxazosin (CARDURA) 8 MG tablet Take 1 tablet (8 mg total) by mouth at bedtime. 12/12/17  Yes Dorena Cookey, MD  fluticasone [facility]) 50 MCG/ACT nasal spray PLACE 1 SPRAY INTO BOTH NOSTRILS DAILY 11/16/16  Yes Dorena Cookey, MD  losartan (COZAAR) 100 MG tablet Take 1 tablet (100 mg total) by mouth 2 (two) times daily. 12/12/17  Yes Dorena Cookey, MD  OZEMPIC 1 MG/DOSE SOPN INJECT 1 MG INTO THE SKIN ONCE A WEEK. Patient taking differently: Inject 1 mg into the skin once a week.  12/05/17  Yes Renato Shin, MD  repaglinide (PRANDIN) 2 MG tablet TAKE 2 TABLETS BY MOUTH THREE TIMES DAILY BEFORE MEAL(S) Patient taking differently: Take 2 mg by mouth 3 (three) times daily before meals.  11/13/18  Yes Renato Shin, MD  simvastatin (ZOCOR) 40 MG tablet Take 1 tablet (40 mg total) by mouth at bedtime. 12/12/17  Yes Dorena Cookey, MD  traMADol (ULTRAM) 50 MG tablet TAKE 1/2 TAB BY MOUTH TWICE A DAY 10/16/18  Yes Vivi Barrack, MD  glucose blood (ONE TOUCH ULTRA TEST) test strip USE TO TEST BLOOD USGAR TWICE A DAY 07/26/18   Renato Shin, MD    Inpatient Medications: Scheduled Meds: . sodium chloride   Intravenous Once  . sodium chloride   Intravenous Once  . sodium chloride   Intravenous Once  . sodium chloride   Intravenous Once  . [START ON 11/16/2018] amLODipine  2.5 mg Oral Daily  . atorvastatin  20 mg Oral q1800  . insulin aspart  0-9 Units Subcutaneous Q4H  . isosorbide mononitrate  30 mg Oral Daily  . mouth rinse  15 mL Mouth Rinse BID  . sodium chloride flush  3 mL Intravenous Q12H   Continuous Infusions: . sodium chloride 50 mL/hr at 11/15/18 1402   PRN Meds: acetaminophen **OR** acetaminophen, ondansetron **OR** ondansetron (ZOFRAN) IV  Allergies:   No Known Allergies  Social History:   Social History   Socioeconomic History  . Marital status: Married     Spouse name: Not on file  . Number of children: Not on file  . Years of education: Not on file  . Highest education level: Not on file  Occupational History  . Occupation: Geographical information systems officer  . Financial resource strain: Not on file  . Food insecurity:    Worry: Not on file    Inability: Not on file  . Transportation needs:    Medical: Not on file    Non-medical: Not on file  Tobacco Use  . Smoking status: Former Smoker    Packs/day: 1.50    Years: 45.00    Pack years: 67.50    Types: Cigarettes    Last attempt to quit: 10/02/2010    Years since quitting: 8.1  . Smokeless tobacco: Never Used  Substance and Sexual Activity  . Alcohol use: Yes  Alcohol/week: 1.0 standard drinks    Types: 1 Shots of liquor per week  . Drug use: No  . Sexual activity: Yes  Lifestyle  . Physical activity:    Days per week: Not on file    Minutes per session: Not on file  . Stress: Not on file  Relationships  . Social connections:    Talks on phone: Not on file    Gets together: Not on file    Attends religious service: Not on file    Active member of club or organization: Not on file    Attends meetings of clubs or organizations: Not on file    Relationship status: Not on file  . Intimate partner violence:    Fear of current or ex partner: Not on file    Emotionally abused: Not on file    Physically abused: Not on file    Forced sexual activity: Not on file  Other Topics Concern  . Not on file  Social History Narrative   Regular exercise-no   Pt went to seminar to quit smoking    Family History:    Family History  Problem Relation Age of Onset  . Cancer Mother   . Pancreatic cancer Mother   . Heart disease Father   . Heart attack Father   . Diabetes Other   . Hyperlipidemia Other   . Hypertension Other   . Rectal cancer Sister   . Colon cancer Neg Hx   . Esophageal cancer Neg Hx   . Stomach cancer Neg Hx   . Stroke Neg Hx   . Colon polyps Neg Hx      ROS:  Please  see the history of present illness.   All other ROS reviewed and negative.     Physical Exam/Data:   Vitals:   11/15/18 0730 11/15/18 0825 11/15/18 1200 11/15/18 1222  BP:  (!) 150/67 (!) 159/46   Pulse:  81 73   Resp:  18 18   Temp: 98.4 F (36.9 C)   98.4 F (36.9 C)  TempSrc: Oral   Oral  SpO2:  100% 98%   Weight:      Height:        Intake/Output Summary (Last 24 hours) at 11/15/2018 1544 Last data filed at 11/15/2018 0900 Gross per 24 hour  Intake 960 ml  Output 1 ml  Net 959 ml   Filed Weights   11/14/18 1440 11/15/18 0353  Weight: 112.7 kg 113.2 kg   Body mass index is 35.81 kg/m.  General:  Well nourished, well developed, in no acute distress, obese HEENT: sclera anicteric  Lymph: no adenopathy Neck: no JVD Endocrine:  No thryomegaly Vascular: No carotid bruits; distal pulses 2+ bilaterally Cardiac:  normal S1, S2; RRR; no murmur  Lungs:  clear to auscultation bilaterally, no wheezing, rhonchi or rales  Abd: NABS, soft, nontender, no hepatomegaly Ext: no edema Musculoskeletal:  No deformities, BUE and BLE strength normal and equal Skin: warm and dry  Neuro:  CNs 2-12 intact, no focal abnormalities noted Psych:  Normal affect   EKG:  The EKG was personally reviewed and demonstrates:  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   Relevant CV Studies: Echo pending  Left heart  catheterization 2015: IMPRESSIONS:  1. Significant, diffuse RCA disease, proximal shelf of calcium/spontaneous dissection, PDA bifurcation with PL also significantly diseased as described above. 2. Mildly reduced left ventricular systolic function based upon nuclear stress of 47% ejection fraction.  LVEDP 25 mmHg, elevated.     1.   Successful PCI of the proximal posterior lateral artery with a 2.75 x 23 Alpine drug-eluting stent, post dilated to greater than 3 mm in diameter.   2.  Successful PCI of the proximal right coronary artery with a 3.5 x 18 alpine drug-eluting stent, postdilated to 4.3 mm in diameter.  Laboratory Data:  Chemistry Recent Labs  Lab 11/13/18 2048 11/14/18 1054 11/15/18 0915  NA 140 142 140  K 3.5 4.0 3.7  CL 111 117* 112*  CO2 20* 17* 18*  GLUCOSE 141* 233* 221*  BUN 16 27* 26*  CREATININE 1.89* 3.45* 2.71*  CALCIUM 7.9* 6.5* 7.3*  GFRNONAA 36* 17* 23*  GFRAA 41* 20* 27*  ANIONGAP 9 8 10     Recent Labs  Lab 11/13/18 2048  PROT 5.5*  ALBUMIN 3.4*  AST 27  ALT 22  ALKPHOS 64  BILITOT 0.7   Hematology Recent Labs  Lab 11/13/18 2048  11/14/18 1054  11/14/18 2201 11/15/18 0327 11/15/18 0915  WBC 16.5*  --  10.2  --   --   --   --   RBC 3.84*  --  2.35*  --   --   --   --   HGB 12.0*   < > 7.3*   < > 9.7* 8.5* 9.1*  HCT 37.2*   < > 23.0*   < > 29.9* 26.0* 28.7*  MCV 96.9  --  97.9  --   --   --   --   MCH 31.3  --  31.1  --   --   --   --   MCHC 32.3  --  31.7  --   --   --   --   RDW 14.2  --  14.4  --   --   --   --   PLT 174  --  117*  --   --   --   --    < > = values in this interval not displayed.   Cardiac EnzymesNo results for input(s): TROPONINI in the last 168 hours. No results for input(s): TROPIPOC in the last 168 hours.  BNPNo results for input(s): BNP, PROBNP in the last 168 hours.  DDimer No results for input(s): DDIMER in the last 168 hours.  Radiology/Studies:  Nm Gi Blood Loss  Result Date: 11/14/2018 CLINICAL DATA:  67 year old GI bleed.  Evaluate for bleeding source. EXAM: NUCLEAR MEDICINE GASTROINTESTINAL BLEEDING SCAN TECHNIQUE: Sequential abdominal images were obtained following intravenous administration of Tc-100m labeled red blood cells. RADIOPHARMACEUTICALS:  Twenty-four mCi Tc-66m pertechnetate in-vitro labeled red cells.  COMPARISON:  None. FINDINGS: Study has technical limitations due to patient motion throughout the examination. Small focus of uptake in the right lower quadrant does not move and finding is indeterminate. Large amount of penile uptake. Uptake in the expected vascular structures. IMPRESSION: Technically challenging examination without a clear source for GI bleeding. Small focus of uptake in the right lower quadrant is indeterminate. Electronically Signed   By: Markus Daft M.D.   On: 11/14/2018 14:30    Assessment and Plan:   1. Arrhythmia: predominately sinus rhythm with 1st degree AV block on telemetry with one episode of 2nd degree type 2 on  CV strip review and occasional 2nd degree type 1 on telemetry. He has been largely asymptomatic with some hypotension yesterday in the setting of acute blood loss anemia. Not on any AV nodal blocking agents. Echo pending. - Do not anticipate further work-up at this time - May need to be considered for PPM in the future given baseline conduction disorder if frequency of arrhythmias increases and he is symptomatic.  2. Acute blood loss anemia in the setting of significant polypectomy during colonoscopy 11/14/18:   For questions or updates, please contact [place] Please consult [email or web address] for contact info under Cardiology/STEMI.   Signed, Abigail Butts, PA-C  11/15/2018 3:44 PM [phone]   I have seen and examined the patient along with Abigail Butts, PA-C .  I have reviewed the chart, notes and new data.  I agree with PA/NP's note.  Key new complaints: he firmly denies syncope, pre-syncope, dizziness, fatigue or dyspnea. His wife confirms that "he is hard to keep up with, always on the go". He has not used his CPAP ("in a while"). CPAP machine is currently packed up and he does not know where it is. Key examination changes: obese, pale. He is lying fully supine in bed without respiratory difficulty. JVP hard to see. Key new findings /  data: he has trifascicular block (long PR, RBBB, LAFB) and occasional Mobitz type 1 second degree AVB on the monitor. Only one episode of possible Mobitz type 2 AV block is seen, with a 2.6 second pause. All the events are asymptomatic. Echo shows normal LVEF and wall motion, dilated RV with normal RV function, diastolic dysfunction with signs of elevated right and left heart pressure (just received a transfusion).   PLAN: He has evidence of both suprahisian and infrahisian conduction disease. Most episodes of AV block are Mobitz type 1. None are associated with bradycardia, all episodes are asymptomatic. Untreated OSA may be increasing the incidence of AV block when asleep. I do not think a pacemaker is indicated at this time. Avoid any medication with negative chronotropic effect (beta blockers, verapamil, diltiazem, digoxin, etc.). He should promptly report any symptoms of bradycardia. Strongly recommend that he starts to use his CPAP again. The last note re: OSA management that I see is from Dr. Elsworth Soho in 2012.   Sanda Klein, MD, [place] [phone] 11/15/2018, 4:17 PM

## 2018-11-15 NOTE — Op Note (Addendum)
[facility] Patient Name: Bryan Haley Procedure Date: 11/14/2018 MRN: [id number] Attending MD: Justice Britain , MD Date of Birth: [birth year] CSN: [id number] Age: 67 Admit Type: Inpatient Procedure:                Colonoscopy Indications:              Treatment of bleeding from polypectomy site Providers:                Justice Britain, MD, Zenon Mayo, RN, Matther Dalton, Technician, Alan Mulder, Technician Referring MD:             Dr. Lilia Pro ([facility]), Pricilla Riffle. Fuller Plan, MD, Thornton Park MD, MD Medicines:                Fentanyl 200 micrograms IV, Midazolam 8 mg IV,                            Diphenhydramine 50 mg IV Complications:            No immediate complications. Estimated Blood Loss:     Estimated blood loss: none. Procedure:                Pre-Anesthesia Assessment:                           - Prior to the procedure, a History and Physical                            was performed, and patient medications and                            allergies were reviewed. The patient's tolerance of                            previous anesthesia was also reviewed. The risks                            and benefits of the procedure and the sedation                            options and risks were discussed with the patient.                            All questions were answered, and informed consent                            was obtained. Prior Anticoagulants: The patient has                            taken aspirin, last dose was 1 day prior to  procedure. ASA Grade Assessment: II - A patient                            with mild systemic disease. After reviewing the                            risks and benefits, the patient was deemed in                            satisfactory condition to undergo the procedure.                           After obtaining informed consent, the  colonoscope                            was passed under direct vision. Throughout the                            procedure, the patient's blood pressure, pulse, and                            oxygen saturations were monitored continuously. The                            CF-HQ190L ([id number]) Olympus adult colonoscope was                            introduced through the anus and advanced to the the                            cecum, identified by appendiceal orifice and                            ileocecal valve. The colonoscopy was technically                            difficult and complex due to excessive bleeding,                            inadequate bowel prep, poor endoscopic                            visualization, a redundant colon, significant                            looping and the patient's poor cooperation - at one                            point towards the end of the procedure the patient                            required 5 persons to keep him down from moving in  the bed, during placement of clips. Completion of                            the procedure was aided by applying abdominal                            pressure and supine positioning. The patient                            tolerated the procedure poorly due to the patient's                            body habitus, the patient's agitation and                            ineffective sedation. The quality of the bowel                            preparation was inadequate. Scope In: 5:39:00 PM Scope Out: 7:23:48 PM Scope Withdrawal Time: 1 hour 5 minutes 50 seconds  Total Procedure Duration: 1 hour 44 minutes 48 seconds  Findings:      The digital rectal exam findings include Maroon Blood hemorrhage.       Pertinent negatives include no palpable rectal lesions.      The colon (entire examined portion) revealed grossly excessive looping.       Advancing the scope required changing the  patient to a supine position,       using manual pressure, withdrawing and reinserting the scope,       straightening and shortening the scope to obtain bowel loop reduction       and using scope torsion.      Clotted blood was found in the entire colon.      A single (solitary) 30 mm ulcer was found in the cecum. No bleeding was       present. Stigmata of recent bleeding were present. For hemostasis, five       hemostatic clips were successfully placed (MR conditional). The complete       closure of the defect was not possible due to the scope position and       patient's cooperation. I did not see a visible vessel in this region.       There was no bleeding at the end of the procedure.      Two 8-12 mm ulcers were found in the cecum. No bleeding was present.       Stigmata of recent bleeding were present. For hemostasis, four       hemostatic clips were successfully placed (MR conditional). There was no       bleeding at the end of the procedure.      A single (solitary) ten mm ulcer was found in the ascending colon. No       bleeding was present. Stigmata of recent bleeding were present, a       visible vessel. Area was successfully injected with a 1:10,000 solution       of epinephrine for hemostasis. Coagulation for hemostasis using bipolar       probe was successful. For continued hemostasis, two hemostatic clips       were  successfully placed (MR conditional). There was no bleeding at the       end of the procedure.      A single (solitary) nine mm ulcer was found in the transverse colon. No       bleeding was present. Stigmata of recent bleeding were present, a       pigmented spot v small visible vessel. Area was successfully injected       with a 1:10,000 solution of epinephrine for hemostasis. Coagulation for       hemostasis using bipolar probe was successful. For continued hemostasis,       two hemostatic clips were successfully placed (MR conditional). There       was no  bleeding at the end of the procedure.      Multiple 8 mm - 20 mm ulcers were found in the descending colon, in the       transverse colon, at the hepatic flexure and in the ascending colon. No       bleeding was present. Stigmata of recent bleeding were present. For       hemostasis, sixteen hemostatic clips were successfully placed (MR       conditional) to different ulcers in the region. There was no bleeding at       the end of the procedure.      Multiple sessile polyps were found in the descending colon, transverse       colon, hepatic flexure and ascending colon. Polypectomy was not       attempted due to intent of procedure and recent bleeding.      Evaluation of the left colon was performed, however, due to the       patient's intolerance and cooperability (was actively awake, swinging       his arms, and trying to sit up, he was disinhibited at this time - more       than 90 minutes into the procedure), although not a cursory view, it was       not as indepth an evaluation as I would have liked; but no active       evidence of significant ulcerated mucosa or any marroon blood or high       risk stigmata were seen. Impression:               - Preparation of the colon was inadequate.                           - Maroon Blood hemorrhage found on digital rectal                            exam.                           - There was significant looping of the colon.                           - Blood in the entire examined colon.                           - A single (solitary) ulcer in the cecum from large  EMR. Clips (MR conditional) were placed.                           - Two ulcers in the cecum. Clips (MR conditional)                            were placed.                           - A single (solitary) ulcer in the ascending colon.                            Injected. Treated with bipolar cautery. Clips (MR                            conditional) were  placed.                           - A single (solitary) ulcer in the transverse                            colon. Injected. Treated with bipolar cautery.                            Clips (MR conditional) were placed.                           - Multiple ulcers in the descending colon, in the                            transverse colon, at the hepatic flexure and in the                            ascending colon. Clips (MR conditional) were placed.                           - Multiple polyps in the descending colon, in the                            transverse colon, at the hepatic flexure and in the                            ascending colon. Resection not attempted. Moderate Sedation:      Moderate (conscious) sedation was administered by the endoscopy nurse       and supervised by the endoscopist. The following parameters were       monitored: oxygen saturation, heart rate, blood pressure, and response       to care. Total physician intraservice time was greater than 120 minutes       of total sedation time and being in the room. Recommendation:           - The patient will be observed post-procedure,  until all discharge criteria are met.                           - Return patient to care of ICU/SDU for ongoing                            care.                           - Clear liquid diet.                           - Monitor Hgb/Hct, we expect some old blood to                            continue to leave his body over the course of the                            next 48-72 hours.                           - If patient manifests recurrent transfusion                            dependent anemia with hematochezia, then he needs                            to be supported with transfusions and he needs to                            proceed with full colonoscopy preparation once                            again and be done with MAC services.                            - Would give IV Iron Infusion on 12/19 or 12/20 to                            optimize stores.                           - The findings and recommendations were discussed                            with the referring physician.                           - The findings and recommendations were discussed                            with the patient's family. Unable to discuss with                            patient as he was  sleeping at end of procedure. Procedure Code(s):        --- Professional ---                           [phone], Moderate sedation services provided by the                            same physician or other qualified health care                            professional performing the diagnostic or                            therapeutic service that the sedation supports,                            requiring the presence of an independent trained                            observer to assist in the monitoring of the                            patient's level of consciousness and physiological                            status; initial 15 minutes of intraservice time,                            patient age 57 years or older                           [id number], Moderate sedation; each additional 15                            minutes intraservice time                           99153, Moderate sedation; each additional 15                            minutes intraservice time                           99153, Moderate sedation; each additional 15                            minutes intraservice time                           99153, Moderate sedation; each additional 15                            minutes intraservice time                           99153, Moderate sedation; each additional 15  minutes intraservice time Diagnosis Code(s):        --- Professional ---                           K92.2, Gastrointestinal hemorrhage, unspecified                            K63.3, Ulcer of intestine                           D12.4, Benign neoplasm of descending colon                           D12.3, Benign neoplasm of transverse colon (hepatic                            flexure or splenic flexure)                           D12.2, Benign neoplasm of ascending colon                           K91.840, Postprocedural hemorrhage of a digestive                            system organ or structure following a digestive                            system procedure CPT copyright 2018 [employer]. All rights reserved. The codes documented in this report are preliminary and upon coder review may  be revised to meet current compliance requirements. Justice Britain, MD 11/15/2018 9:46:22 AM Number of Addenda: 0

## 2018-11-15 NOTE — Progress Notes (Addendum)
Patient Demographics:    Bryan Haley, is a 67 y.o. male, DOB - 1951/08/27, WGN:[id number]  Admit date - 11/13/2018   Admitting Physician Bryan Reach, MD  Outpatient Primary MD for the patient is Bryan Barrack, MD  LOS - 2   Chief Complaint  Patient presents with  . Rectal Bleeding        Subjective:    Bryan Haley today has no fevers, no emesis,  No chest pain, episodes of tachyarrhythmia as well as episodes of bradyarrhythmia... Denies shortness of breath, no significant dizziness, no further bleeding per rectum, no BM over the last 12 hours  Assessment  & Plan :    Principal Problem:   Hematochezia Active Problems:   Dyslipidemia associated with type 2 diabetes mellitus (HCC)   Hypertension associated with diabetes ([place])   CKD stage 3 due to type 2 diabetes mellitus ([place])   Sleep apnea   COLONIC POLYPS, HX OF   CAD (coronary artery disease)   Diabetes (HCC)   GI bleed  Brief Summary 67 y.o. male with medical history significant for CAD s/p DES, type 2 diabetes, hypertension, hyperlipidemia, CKD stage III, and OSA on CPAP, who  underwent colonoscopy morning of admission 11/13/2018 for high risk colon cancer surveillance.  Per op note 33 colonic polyps were seen, 27 which were resected.  Left colon diverticulosis and internal hemorrhoids are also noted.  He did well postop and was discharged home, later developed 3 episodes of large-volume hematochezia associated with lightheadedness without syncope... Readmitted on 11/13/2018 with symptomatic anemia, so far has received 4 units of packed cells    Plan:- 1)Acute blood loss Anemia--- preprocedure hemoglobin was 12, hemoglobin had dropped to 7.1, patient has received total 4 units of packed cells, hemoglobin today is up to 9.1, acute blood loss anemia secondary to post polypectomy bleed, largest polyp in the cecum is the most likely  culprit, patient underwent colonoscopy late on 11/14/18 with several polypectomy site ulcers- 2 with visible vessels- bleeding was controlled after placement of 29 clips , epi and bicap of  multiple polyp sites , hemodynamic status improved with transfusion  2)AKI----acute kidney injury on CKD stage - III, suspect AKI secondary to transient hypotension and hypovolemia secondary to acute GI blood loss,     creatinine on admission=1.89  ,   baseline creatinine = 1.8    , creatinine is now= 2.71 (peak this admission 3.45),  renally adjust medications, avoid nephrotoxic agents/dehydration/hypotension , continue IV fluids, continue to hold losartan and Invokana  3)H/o CAD--- patient is having significant arrhythmias, bradycardia down to the 30s, runs of nonsustained V. Tach, will get cardiology input, will check TSH, check serum magnesium, check echocardiogram, continue simvastatin, unable to give aspirin due to significant GI bleed, unable to give beta blocker due to significant episodes of bradycardia down into the 30s, okay to add isosorbide 30 mg daily  4)HTN--- stable, restart amlodipine at 2.5 mg daily, add Imdur 30 mg daily,   5)DM2- c/n to hold invokanna due to kidney concerns, Use Novolog/Humalog Sliding scale insulin with Accu-Cheks/Fingersticks as ordered    Disposition/Need for in-Hospital Stay- patient unable to be discharged at this time due to significant arrhythmias including ventricular tachycardia and significant bradycardia-  Code  Status : FULL   Disposition Plan  : Home after  cardiology evaluation  Consults  :  Gi/Cardiology   DVT Prophylaxis  :    SCDs  Lab Results  Component Value Date   PLT 117 (L) 11/14/2018    Inpatient Medications  Scheduled Meds: . sodium chloride   Intravenous Once  . sodium chloride   Intravenous Once  . sodium chloride   Intravenous Once  . sodium chloride   Intravenous Once  . insulin aspart  0-9 Units Subcutaneous Q4H  . mouth rinse   15 mL Mouth Rinse BID  . simvastatin  40 mg Oral q1800  . sodium chloride flush  3 mL Intravenous Q12H   Continuous Infusions: . sodium chloride     PRN Meds:.acetaminophen **OR** acetaminophen, ondansetron **OR** ondansetron (ZOFRAN) IV    Anti-infectives (From admission, onward)   None        Objective:   Vitals:   11/15/18 0730 11/15/18 0825 11/15/18 1200 11/15/18 1222  BP:  (!) 150/67 (!) 159/46   Pulse:  81 73   Resp:  18 18   Temp: 98.4 F (36.9 C)   98.4 F (36.9 C)  TempSrc: Oral   Oral  SpO2:  100% 98%   Weight:      Height:        Wt Readings from Last 3 Encounters:  11/15/18 113.2 kg  11/13/18 113.9 kg  10/30/18 113.9 kg     Intake/Output Summary (Last 24 hours) at 11/15/2018 1316 Last data filed at 11/15/2018 0900 Gross per 24 hour  Intake 1680 ml  Output 1 ml  Net 1679 ml     Physical Exam Patient is examined daily including today on 11/15/18 , exams remain the same as of yesterday except that has changed   Gen:- Awake Alert,  obese HEENT:- [place].AT, No sclera icterus Neck-Supple Neck,No JVD,.  Lungs-  CTAB , fair symmetrical air movement CV- S1, S2 normal, irregular  Abd-  +ve B.Sounds, Abd Soft, No tenderness,    Extremity/Skin:- No  edema, pedal pulses present  Psych-affect is appropriate, oriented x3 Neuro-no new focal deficits, no tremors   Data Review:   Micro Results Recent Results (from the past 240 hour(s))  MRSA PCR Screening     Status: None   Collection Time: 11/14/18  2:39 PM  Result Value Ref Range Status   MRSA by PCR NEGATIVE NEGATIVE Final    Comment:        The GeneXpert MRSA Assay (FDA approved for NASAL specimens only), is one component of a comprehensive MRSA colonization surveillance program. It is not intended to diagnose MRSA infection nor to guide or monitor treatment for MRSA infections. Performed at [facility], [place] [address], [place], [place] [address]     Radiology  Reports Nm Gi Blood Loss  Result Date: 11/14/2018 CLINICAL DATA:  67 year old GI bleed.  Evaluate for bleeding source. EXAM: NUCLEAR MEDICINE GASTROINTESTINAL BLEEDING SCAN TECHNIQUE: Sequential abdominal images were obtained following intravenous administration of Tc-41m labeled red blood cells. RADIOPHARMACEUTICALS:  Twenty-four mCi Tc-61m pertechnetate in-vitro labeled red cells. COMPARISON:  None. FINDINGS: Study has technical limitations due to patient motion throughout the examination. Small focus of uptake in the right lower quadrant does not move and finding is indeterminate. Large amount of penile uptake. Uptake in the expected vascular structures. IMPRESSION: Technically challenging examination without a clear source for GI bleeding. Small focus of uptake in the right lower quadrant is indeterminate. Electronically Signed  By: Markus Daft M.D.   On: 11/14/2018 14:30     CBC Recent Labs  Lab 11/13/18 2048  11/14/18 1054 11/14/18 1436 11/14/18 2201 11/15/18 0327 11/15/18 0915  WBC 16.5*  --  10.2  --   --   --   --   HGB 12.0*   < > 7.3* 7.0* 9.7* 8.5* 9.1*  HCT 37.2*   < > 23.0* 21.2* 29.9* 26.0* 28.7*  PLT 174  --  117*  --   --   --   --   MCV 96.9  --  97.9  --   --   --   --   MCH 31.3  --  31.1  --   --   --   --   MCHC 32.3  --  31.7  --   --   --   --   RDW 14.2  --  14.4  --   --   --   --    < > = values in this interval not displayed.    Chemistries  Recent Labs  Lab 11/13/18 2048 11/14/18 1054 11/15/18 0915  NA 140 142 140  K 3.5 4.0 3.7  CL 111 117* 112*  CO2 20* 17* 18*  GLUCOSE 141* 233* 221*  BUN 16 27* 26*  CREATININE 1.89* 3.45* 2.71*  CALCIUM 7.9* 6.5* 7.3*  AST 27  --   --   ALT 22  --   --   ALKPHOS 64  --   --   BILITOT 0.7  --   --     Lab Results  Component Value Date   HGBA1C 7.0 (A) 10/10/2018   - Coagulation profile Recent Labs  Lab 11/14/18 1054  INR 1.18    Roxan Hockey M.D on 11/15/2018 at 1:16  PM  Pager---[phone] Go to [email or web address] - password [email or web address] for contact info  [facility] - Office  [phone]

## 2018-11-16 ENCOUNTER — Encounter: Payer: Self-pay | Admitting: Gastroenterology

## 2018-11-16 ENCOUNTER — Other Ambulatory Visit: Payer: Self-pay

## 2018-11-16 DIAGNOSIS — E785 Hyperlipidemia, unspecified: Secondary | ICD-10-CM

## 2018-11-16 DIAGNOSIS — N183 Chronic kidney disease, stage 3 (moderate): Secondary | ICD-10-CM

## 2018-11-16 DIAGNOSIS — E1122 Type 2 diabetes mellitus with diabetic chronic kidney disease: Secondary | ICD-10-CM

## 2018-11-16 DIAGNOSIS — K922 Gastrointestinal hemorrhage, unspecified: Secondary | ICD-10-CM

## 2018-11-16 DIAGNOSIS — E1169 Type 2 diabetes mellitus with other specified complication: Secondary | ICD-10-CM

## 2018-11-16 DIAGNOSIS — E1159 Type 2 diabetes mellitus with other circulatory complications: Secondary | ICD-10-CM

## 2018-11-16 DIAGNOSIS — I10 Essential (primary) hypertension: Secondary | ICD-10-CM

## 2018-11-16 LAB — CBC
HCT: 22.8 % — ABNORMAL LOW (ref 39.0–52.0)
Hemoglobin: 7.5 g/dL — ABNORMAL LOW (ref 13.0–17.0)
MCH: 31.1 pg (ref 26.0–34.0)
MCHC: 32.9 g/dL (ref 30.0–36.0)
MCV: 94.6 fL (ref 80.0–100.0)
Platelets: 111 10*3/uL — ABNORMAL LOW (ref 150–400)
RBC: 2.41 MIL/uL — ABNORMAL LOW (ref 4.22–5.81)
RDW: 15.4 % (ref 11.5–15.5)
WBC: 7.1 10*3/uL (ref 4.0–10.5)
nRBC: 0 % (ref 0.0–0.2)

## 2018-11-16 LAB — BASIC METABOLIC PANEL
Anion gap: 8 (ref 5–15)
BUN: 16 mg/dL (ref 8–23)
CO2: 21 mmol/L — ABNORMAL LOW (ref 22–32)
Calcium: 7.7 mg/dL — ABNORMAL LOW (ref 8.9–10.3)
Chloride: 115 mmol/L — ABNORMAL HIGH (ref 98–111)
Creatinine, Ser: 1.92 mg/dL — ABNORMAL HIGH (ref 0.61–1.24)
GFR calc Af Amer: 41 mL/min — ABNORMAL LOW (ref >60)
GFR calc non Af Amer: 35 mL/min — ABNORMAL LOW (ref >60)
Glucose, Bld: 132 mg/dL — ABNORMAL HIGH (ref 70–99)
Potassium: 3.4 mmol/L — ABNORMAL LOW (ref 3.5–5.1)
Sodium: 144 mmol/L (ref 135–145)

## 2018-11-16 LAB — GLUCOSE, CAPILLARY
Glucose-Capillary: 127 mg/dL — ABNORMAL HIGH (ref 70–99)
Glucose-Capillary: 137 mg/dL — ABNORMAL HIGH (ref 70–99)
Glucose-Capillary: 163 mg/dL — ABNORMAL HIGH (ref 70–99)
Glucose-Capillary: 152 mg/dL — ABNORMAL HIGH (ref 70–99)

## 2018-11-16 LAB — PREPARE RBC (CROSSMATCH)

## 2018-11-16 MED ORDER — AMLODIPINE BESYLATE 5 MG PO TABS
7.5000 mg | ORAL_TABLET | Freq: Once | ORAL | Status: AC
  Administered 2018-11-16: 7.5 mg via ORAL
  Filled 2018-11-16: qty 2

## 2018-11-16 MED ORDER — SODIUM CHLORIDE 0.9% IV SOLUTION
Freq: Once | INTRAVENOUS | Status: AC
  Administered 2018-11-16: 10:00:00 via INTRAVENOUS

## 2018-11-16 MED ORDER — HYDRALAZINE HCL 20 MG/ML IJ SOLN
10.0000 mg | Freq: Four times a day (QID) | INTRAMUSCULAR | Status: DC | PRN
  Administered 2018-11-16: 10 mg via INTRAVENOUS
  Filled 2018-11-16: qty 1

## 2018-11-16 MED ORDER — ISOSORBIDE MONONITRATE ER 30 MG PO TB24: 30.0000 mg | ORAL_TABLET | Freq: Every day | ORAL | 2 refills | Status: DC

## 2018-11-16 MED ORDER — POTASSIUM CHLORIDE CRYS ER 20 MEQ PO TBCR: 40.0000 meq | EXTENDED_RELEASE_TABLET | Freq: Once | ORAL | Status: DC

## 2018-11-16 MED ORDER — AMLODIPINE BESYLATE 10 MG PO TABS: 10.0000 mg | ORAL_TABLET | Freq: Every day | ORAL | 3 refills | Status: DC

## 2018-11-16 MED ORDER — POTASSIUM CHLORIDE CRYS ER 20 MEQ PO TBCR
40.0000 meq | EXTENDED_RELEASE_TABLET | Freq: Once | ORAL | Status: AC
  Administered 2018-11-16: 40 meq via ORAL
  Filled 2018-11-16: qty 2

## 2018-11-16 MED ORDER — ASPIRIN 81 MG PO CHEW: 81.0000 mg | CHEWABLE_TABLET | Freq: Every day | ORAL | Status: AC

## 2018-11-16 MED ORDER — LOSARTAN POTASSIUM 100 MG PO TABS: 100.0000 mg | ORAL_TABLET | Freq: Two times a day (BID) | ORAL | 4 refills | Status: DC

## 2018-11-16 MED ORDER — CANAGLIFLOZIN 300 MG PO TABS: 300.0000 mg | ORAL_TABLET | Freq: Every day | ORAL | 11 refills | Status: DC

## 2018-11-16 MED ORDER — FUROSEMIDE 10 MG/ML IJ SOLN
40.0000 mg | Freq: Once | INTRAMUSCULAR | Status: AC
  Administered 2018-11-16: 40 mg via INTRAVENOUS
  Filled 2018-11-16: qty 4

## 2018-11-16 NOTE — Progress Notes (Signed)
Called patient to let him know that he left his coat in the room and he verbalized that he would come to get it tomorrow. Placed at the front desk.

## 2018-11-16 NOTE — Care Management Note (Signed)
Case Management Note  Patient Details  Name: Bryan Haley MRN: [id number] Date of Birth: 04-19-1951  Subjective/Objective:      Lower gi bleed             Discharge readiness is indicated by patient meeting Recovery Milestones, including ALL of the following: ? Hemodynamic stability bp=127/26-173/74 ? Tachypnea absent rate=22 ? Hypoxemia absent yes ? Afebrile, or temperature acceptable for next level of care yes ? Oxygen absent or at baseline need no o2 on room air ? Mental status at baseline yes ? Antibiotic regimen acceptable for next level of care n/a ? Ambulatory no ? Oral hydration, medications, and diet ? hgb -7.5-rec'ing one unit of prbc's/ ? Not ready for next level of care   Action/Plan: Following for progression in sdu No cm needs present at time of review.  Expected Discharge Date:  (unknown)               Expected Discharge Plan:  Home/Self Care  In-House Referral:     Discharge planning Services  CM Consult  Post Acute Care Choice:    Choice offered to:     DME Arranged:    DME Agency:     HH Arranged:    HH Agency:     Status of Service:  In process, will continue to follow  If discussed at Long Length of Stay Meetings, dates discussed:    Additional Comments:  Leeroy Cha, RN 11/16/2018, 10:12 AM

## 2018-11-16 NOTE — Discharge Instructions (Signed)
Chronic Kidney Disease, Adult Chronic kidney disease (CKD) occurs when the kidneys become damaged slowly over a long period of time. The kidneys are a pair of organs that do many important jobs in the body, including:  Removing waste and extra fluid from the blood to make urine.  Making hormones that maintain the amount of fluid in tissues and blood vessels.  Maintaining the right amount of fluids and chemicals in the body. A small amount of kidney damage may not cause problems, but a large amount of damage may make it hard or impossible for the kidneys to work the way they should. If steps are not taken to slow down kidney damage or to stop it from getting worse, the kidneys may stop working permanently (end-stage renal disease or ESRD). Most of the time, CKD does not go away, but it can often be controlled. People who have CKD are usually able to live normal lives. What are the causes? The most common causes of this condition are diabetes and high blood pressure (hypertension). Other causes include:  Heart and blood vessel (cardiovascular) disease.  Kidney diseases, such as: ? Glomerulonephritis. ? Interstitial nephritis. ? Polycystic kidney disease. ? Renal vascular disease.  Diseases that affect the immune system.  Genetic diseases.  Medicines that damage the kidneys, such as anti-inflammatory medicines.  Being around or being in contact with poisonous (toxic) substances.  A kidney or urinary infection that occurs again and again (recurs).  Vasculitis. This is swelling or inflammation of the blood vessels.  A problem with urine flow that may be caused by: ? Cancer. ? Having kidney stones more than one time. ? An enlarged prostate, in males. What increases the risk? You are more likely to develop this condition if you:  Are older than age 81.  Are male.  Are African-American, Hispanic, Asian, [place], or American [place].  Are a current or former  smoker.  Are obese.  Have a family history of kidney disease or failure.  Often take medicines that are damaging to the kidneys. What are the signs or symptoms? Symptoms of this condition include:  Swelling (edema) of the face, legs, ankles, or feet.  Tiredness (lethargy) and having less energy.  Nausea or vomiting.  Confusion or trouble concentrating.  Problems with urination, such as: ? Painful or burning feeling during urination. ? Decreased urine production. ? Frequent urination, especially at night. ? Bloody urine.  Muscle twitches and cramps, especially in the legs.  Shortness of breath.  Weakness.  Loss of appetite.  Metallic taste in the mouth.  Trouble sleeping.  Dry, itchy skin.  A low blood count (anemia).  Pale lining of the eyelids and surface of the eye (conjunctiva). Symptoms develop slowly and may not be obvious until the kidney damage becomes severe. It is possible to have kidney disease for years without having any symptoms. How is this diagnosed? This condition may be diagnosed based on:  Blood tests.  Urine tests.  Imaging tests, such as an ultrasound or CT scan.  A test in which a sample of tissue is removed from the kidneys to be examined under a microscope (kidney biopsy). These test results will help your health care provider determine how serious the CKD is. How is this treated? There is no cure for most cases of this condition, but treatment usually relieves symptoms and prevents or slows the progression of the disease. Treatment may include:  Making diet changes, which may require you to avoid alcohol, salty foods (  sodium), and foods that are high in potassium, calcium, and protein.  Medicines: ? To lower blood pressure. ? To control blood glucose. ? To relieve anemia. ? To relieve swelling. ? To protect your bones. ? To improve the balance of electrolytes in your blood.  Removing toxic waste from the body through types of  dialysis, if the kidneys can no longer do their job (kidney failure).  Managing any other conditions that are causing your CKD or making it worse. Follow these instructions at home: Medicines  Take over-the-counter and prescription medicines only as told by your health care provider. The dose of some medicines that you take may need to be adjusted.  Do not take any new medicines unless approved by your health care provider. Many medicines can worsen your kidney damage.  Do not take any vitamin and mineral supplements unless approved by your health care provider. Many nutritional supplements can worsen your kidney damage. General instructions  Follow your prescribed diet as told by your health care provider.  Do not use any products that contain nicotine or tobacco, such as cigarettes and e-cigarettes. If you need help quitting, ask your health care provider.  Monitor and track your blood pressure at home. Report changes in your blood pressure as told by your health care provider.  If you are being treated for diabetes, monitor and track your blood sugar (blood glucose) levels as told by your health care provider.  Maintain a healthy weight. If you need help with this, ask your health care provider.  Start or continue an exercise plan. Exercise at least 30 minutes a day, 5 days a week.  Keep your immunizations up to date as told by your health care provider.  Keep all follow-up visits as told by your health care provider. This is important. Where to find more information  American Association of Kidney Patients: BombTimer.gl  National Kidney Foundation: www.kidney.[place]: https://mathis.com/  [facility]: www.lifeoptions.org and www.kidneyschool.org Contact a health care provider if:  Your symptoms get worse.  You develop new symptoms. Get help right away if:  You develop symptoms of ESRD, which include: ? Headaches. ? Numbness in the  hands or feet. ? Easy bruising. ? Frequent hiccups. ? Chest pain. ? Shortness of breath. ? Lack of menstruation, in women.  You have a fever.  You have decreased urine production.  You have pain or bleeding when you urinate. Summary  Chronic kidney disease (CKD) occurs when the kidneys become damaged slowly over a long period of time.  The most common causes of this condition are diabetes and high blood pressure (hypertension).  There is no cure for most cases of this condition, but treatment usually relieves symptoms and prevents or slows the progression of the disease. Treatment may include a combination of medicines and lifestyle changes. This information is not intended to replace advice given to you by your health care provider. Make sure you discuss any questions you have with your health care provider. Document Released: 08/23/2008 Document Revised: 12/22/2016 Document Reviewed: 12/22/2016 Elsevier Interactive Patient Education  2019 [place] ibuprofen/Advil/Aleve/Motrin/Goody Powders/Naproxen/BC powders/Meloxicam/Diclofenac/Indomethacin and other Nonsteroidal anti-inflammatory medications as these will make you more likely to bleed and can cause stomach ulcers, can also cause Kidney problems.   2)Follow-up with Gastroenterologist on Monday, 11/19/2018 for repeat CBC/complete blood count and repeat BMP/kidney and electrolyte test  3)Call or return if any blood in stool or dark stools  4)Follow-up with your Pulmonologist Dr. Elsworth Soho to  get another sleep study so you can get your CPAP machine  5)Hold Invokana and Losartan until after your kidney function test has been rechecked on Monday, 11/19/2018

## 2018-11-16 NOTE — Progress Notes (Signed)
Called Dr. Denton Brick about increase blood pressure. Ordered PRN medication to be given and reduced IV fluid administration. Also discussed with Dr. Vonna Kotyk and he adjusted medications to mimic what patient takes at home. Will give medications to reduce blood pressure and continue to monitor.

## 2018-11-16 NOTE — Progress Notes (Signed)
Discharge instructions (including medications) discussed with and copy provided to patient/caregiver 

## 2018-11-16 NOTE — Progress Notes (Signed)
Patient ID: Bryan Haley, male   DOB: 1951/04/19, 67 y.o.   MRN: [id number]     Progress Note   Subjective    Up in chair, feels ok, weak- but no c/o CP, SOB. No stools overnight- stool brown yesterday  HGB has drifted further overnight - 7.5 this am   Objective   Vital signs in last 24 hours: Temp:  [98.3 F (36.8 C)-98.7 F (37.1 C)] 98.6 F (37 C) (12/20 0800) Pulse Rate:  [61-80] 67 (12/20 0800) Resp:  [0-19] 14 (12/20 0800) BP: (127-168)/(26-115) 168/115 (12/20 0800) SpO2:  [90 %-98 %] 97 % (12/20 0800) Weight:  [113.2 kg] 113.2 kg (12/20 0351) Last BM Date: 11/15/18 General:    white male  in NAD Heart:  Regular rate and rhythm; no murmurs Lungs: Respirations even and unlabored, lungs CTA bilaterally Abdomen:  Soft, nontender and nondistended. Normal bowel sounds. Extremities:  Without edema. Neurologic:  Alert and oriented,  grossly normal neurologically. Psych:  Cooperative. Normal mood and affect.  Intake/Output from previous day: 12/19 0701 - 12/20 0700 In: 1368.2 [P.O.:720; I.V.:648.2] Out: -  Intake/Output this shift: No intake/output data recorded.  Lab Results: Recent Labs    11/13/18 2048  11/14/18 1054  11/15/18 1630 11/15/18 2110 11/16/18 0328  WBC 16.5*  --  10.2  --   --   --  7.1  HGB 12.0*   < > 7.3*   < > 7.9* 7.7* 7.5*  HCT 37.2*   < > 23.0*   < > 24.6* 23.7* 22.8*  PLT 174  --  117*  --   --   --  111*   < > = values in this interval not displayed.   BMET Recent Labs    11/14/18 1054 11/15/18 0915 11/16/18 0328  NA 142 140 144  K 4.0 3.7 3.4*  CL 117* 112* 115*  CO2 17* 18* 21*  GLUCOSE 233* 221* 132*  BUN 27* 26* 16  CREATININE 3.45* 2.71* 1.92*  CALCIUM 6.5* 7.3* 7.7*   LFT Recent Labs    11/13/18 2048  PROT 5.5*  ALBUMIN 3.4*  AST 27  ALT 22  ALKPHOS 64  BILITOT 0.7   PT/INR Recent Labs    11/14/18 1054  LABPROT 14.9  INR 1.18    Studies/Results: Nm Gi Blood Loss  Result Date:  11/14/2018 CLINICAL DATA:  67 year old GI bleed.  Evaluate for bleeding source. EXAM: NUCLEAR MEDICINE GASTROINTESTINAL BLEEDING SCAN TECHNIQUE: Sequential abdominal images were obtained following intravenous administration of Tc-58m labeled red blood cells. RADIOPHARMACEUTICALS:  Twenty-four mCi Tc-48m pertechnetate in-vitro labeled red cells. COMPARISON:  None. FINDINGS: Study has technical limitations due to patient motion throughout the examination. Small focus of uptake in the right lower quadrant does not move and finding is indeterminate. Large amount of penile uptake. Uptake in the expected vascular structures. IMPRESSION: Technically challenging examination without a clear source for GI bleeding. Small focus of uptake in the right lower quadrant is indeterminate. Electronically Signed   By: Markus Daft M.D.   On: 11/14/2018 14:30       Assessment / Plan:     #1 68 yo WM with CAD, admitted with acute post polypectomy bleed- major- after Colonoscopy with removal of 28 polyps  On 75/64  Course complicated by hypotension, marked anemia -requiring transfusions  He is stable s/p repeat Colonoscopy with  29 clips placed to post polypectomy ulcer sites .  HGB had drifted past 24 hours despite no obvious bleeding  Plan; With hx CAD, and major bleed with small but real risk of recurrent bleed  Think he will benefit from transfusion x 2 prior to discharge .  He can be discharged this afternoon after blood infused   He will come [facility] Monday for f/u Hgb. Same restrictions as in prior note, and we will arrange outpt f/u with Dr Fuller Plan in 2 weeks.        Principal Problem:   Hematochezia Active Problems:   Dyslipidemia associated with type 2 diabetes mellitus ([place])   Hypertension associated with diabetes ([place])   CKD stage 3 due to type 2 diabetes mellitus ([place])   Sleep apnea   COLONIC POLYPS, HX OF   CAD (coronary artery disease)   Diabetes ([place])   GI bleed     LOS:  3 days   Amy Esterwood  11/16/2018, 9:04 AM

## 2018-11-16 NOTE — Plan of Care (Signed)
  Problem: Education: °Goal: Knowledge of General Education information will improve °Description: Including pain rating scale, medication(s)/side effects and non-pharmacologic comfort measures °Outcome: Progressing °  °Problem: Health Behavior/Discharge Planning: °Goal: Ability to manage health-related needs will improve °Outcome: Progressing °  °Problem: Skin Integrity: °Goal: Risk for impaired skin integrity will decrease °Outcome: Progressing °  °Problem: Pain Managment: °Goal: General experience of comfort will improve °Outcome: Progressing °  °

## 2018-11-16 NOTE — Discharge Summary (Signed)
Bryan Haley, is a 67 y.o. male  DOB [birth year]  MRN [id number].  Admission date:  11/13/2018  Admitting Physician  Elwyn Reach, MD  Discharge Date:  11/16/2018   Primary MD  Vivi Barrack, MD  Recommendations for primary care physician for things to follow:   1)Avoid ibuprofen/Advil/Aleve/Motrin/Goody Powders/Naproxen/BC powders/Meloxicam/Diclofenac/Indomethacin and other Nonsteroidal anti-inflammatory medications as these will make you more likely to bleed and can cause stomach ulcers, can also cause Kidney problems.   2)Follow-up with Gastroenterologist on Monday, 11/19/2018 for repeat CBC/complete blood count and repeat BMP/kidney and electrolyte test  3)Call or return if any blood in stool or dark stools  4)Follow-up with your Pulmonologist Dr. Elsworth Soho to get another sleep study so you can get your CPAP machine  5)Hold Invokana and Losartan until after your kidney function test has been rechecked on Monday, 11/19/2018   Admission Diagnosis  Lower GI bleed [K92.2] GI bleed [K92.2]   Discharge Diagnosis  Lower GI bleed [K92.2] GI bleed [K92.2]    Principal Problem:   Hematochezia Active Problems:   Dyslipidemia associated with type 2 diabetes mellitus ([place])   Hypertension associated with diabetes ([place])   CKD stage 3 due to type 2 diabetes mellitus ([place])   Sleep apnea   COLONIC POLYPS, HX OF   CAD (coronary artery disease)   Diabetes ([place])   GI bleed      Past Medical History:  Diagnosis Date  . Allergy   . Arthritis    feet  . COLONIC POLYPS, HX OF 10/02/2008  . Coronary artery disease   . Coronary atherosclerosis of native coronary artery 12/05/2013   Prox RCA, and PLA DES 12/04/13.   . Diabetic retinopathy of both eyes ([place])    Laser surgery, Dr. Zadie Rhine  . 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 Exertional shortness of  breath    "last 2-3 yrs" (12/04/2013)  . GOUT 07/11/2007  . Hyperlipidemia   . HYPERTENSION 07/06/2007  . HYPOGONADISM 06/11/2010  . OSA (obstructive sleep apnea) 10/08/2009   wears CPAP "more than 1/2 the time"  . Other and unspecified angina pectoris   . RENAL INSUFFICIENCY 12/11/2008  . Sleep apnea    no cpap     Past Surgical History:  Procedure Laterality Date  . CARPAL TUNNEL RELEASE Left ~ 2004  . COLONOSCOPY    . COLONOSCOPY WITH PROPOFOL N/A 11/14/2018   Procedure: COLONOSCOPY WITH PROPOFOL;  Surgeon: Rush Landmark Telford Nab., MD;  Location: [facility];  Service: Gastroenterology;  Laterality: N/A;  . CORONARY ANGIOPLASTY WITH STENT PLACEMENT  12/04/2013   "2 stents"  . EYE EXAMINATION UNDER ANESTHESIA W/ RETINAL CRYOTHERAPY AND RETINAL LASER Bilateral 2005-2010   "for my diabetes" (12/04/2013)  . HOT HEMOSTASIS N/A 11/14/2018   Procedure: HOT HEMOSTASIS (ARGON PLASMA COAGULATION/BICAP);  Surgeon: Irving Copas., MD;  Location: [facility];  Service: Gastroenterology;  Laterality: N/A;  . LEFT HEART CATHETERIZATION WITH CORONARY ANGIOGRAM Bilateral 12/04/2013  Procedure: LEFT HEART CATHETERIZATION WITH CORONARY ANGIOGRAM;  Surgeon: Candee Furbish, MD;  Location: [facility] CATH LAB;  Service: Cardiovascular;  Laterality: Bilateral;  . POLYPECTOMY    . SUBMUCOSAL INJECTION  11/14/2018   Procedure: SUBMUCOSAL INJECTION;  Surgeon: Rush Landmark Telford Nab., MD;  Location: [facility];  Service: Gastroenterology;;       HPI  from the history and physical done on the day of admission:    Chief Complaint: Hematochezia  HPI: Bryan Haley is a 67 y.o. male with medical history significant for CAD s/p DES, type 2 diabetes, hypertension, hyperlipidemia, CKD stage III, and OSA on CPAP, and history of colonic polyps who presented to the ED with hematochezia.  Patient underwent colonoscopy morning of admission 11/13/2018 for high risk colon cancer surveillance.  Per op note 33 colonic  polyps were seen, 27 which were resected.  Left colon diverticulosis and internal hemorrhoids are also noted.  He did well postop and was discharged home.  This afternoon he developed 3 episodes of large-volume hematochezia associated with lightheadedness without syncope.  His wife called the gastroenterologist's office and was instructed to come to the emergency department.  He is on low-dose aspirin for history of CAD and reports taking it last this morning.  He denies any NSAID use.  He denies any chest pain, dyspnea, abdominal pain, fevers.  ED Course:  Initial vitals showed BP 122/60, pulse 70, RR 16, temp 98.34F, SPO2 95% on room air.  He had another large bloody bowel movement on arrival in the ED with a drop in blood pressure to 85/50s.  He was given 1 L normal saline with improvement.  Labs were notable for hemoglobin 12.0 (last value was 14.5 ago), hematocrit 37.2, platelets 174, WBC 16.5.  Creatinine 1.89 which is at his baseline.  Patient was typed and screened.  Gastroenterology, Dr. Ardis Hughs, was consulted who recommended admission and to make patient n.p.o. at midnight for colonoscopy tomorrow.  Hospital service was consulted for admission.   Hospital Course:     Brief Summary 67 y.o.malewith medical history significant forCADs/p DES,type 2 diabetes, hypertension, hyperlipidemia, CKD stage III, and OSA on CPAP,who  underwent colonoscopy morning of admission 11/13/2018 for high risk colon cancer surveillance. Per op note 33 colonic polyps were seen, 27 which were resected. Left colon diverticulosis and internal hemorrhoids are also noted. He did well postop and was discharged home, later developed 3episodes of large-volume hematochezia associated with lightheadedness without syncope... Readmitted on 11/13/2018 with symptomatic anemia, so far has received 4 units of packed cells    Plan:- 1)Acute Blood Loss Anemia--- preprocedure hemoglobin was 12, hemoglobin had dropped  to 7.1, patient has received total 4 units of packed cells, hemoglobin was up to 9.1,  hemoglobin on 11/16/2018 is back down to 7.5, patient received additional 2 units of packed cells on 11/16/2018 prior to discharge home, he did have brown stool on 11/15/2018 and also another brown stool on 11/16/2018, acute blood loss anemia secondary to post polypectomy bleed, largest polyp in the cecum is the most likely culprit, patient underwent colonoscopy late on 11/14/18 with several polypectomy site ulcers- 2 with visible vessels- bleeding was controlled after placement of 29 clips , epi and bicap of  multiple polyp sites , hemodynamic status improved with transfusion.... 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 Marlou Porch  3)AKI----acute kidney injury on CKD stage - III, suspect AKI secondary to transient hypotension and hypovolemia secondary to acute GI blood loss,     creatinine on admission=1.89  ,   baseline creatinine = 1.8    , creatinine is now= 1.9 (peak this admission 3.45),  renally adjust medications, avoid nephrotoxic agents/dehydration/hypotension , continue IV fluids, continue to hold losartan and Invokana until BMP is rechecked on Monday, 11/19/2018  3)HTN--- BP not at goal, okay to increase amlodipine back to 10 mg daily which was patient's home dose , c/n  Imdur 30 mg daily,   5)DM2- c/n to hold invokanna due to kidney concerns,     Code Status : FULL  Disposition Plan  : Home  Consults  :  Gi/Cardiology  DVT Prophylaxis  :    SCDs   Discharge Condition: stable  Follow UP--- cardiologist Dr. Marlou Porch, gastroenterologist on  Monday, 11/19/2018  Diet and Activity recommendation:  As advised  Discharge Instructions    Discharge Instructions    Call MD for:  difficulty breathing, headache or visual disturbances   Complete by:  As directed    Call MD for:  persistant dizziness or light-headedness   Complete by:  As directed    Call MD for:  persistant nausea and vomiting   Complete by:  As directed    Call MD for:  severe uncontrolled pain   Complete by:  As directed    Call MD for:  temperature >100.4   Complete by:  As directed    Diet - low sodium heart healthy   Complete by:  As directed    Diet Carb Modified   Complete by:  As directed    Discharge instructions   Complete by:  As directed    1)Avoid ibuprofen/Advil/Aleve/Motrin/Goody Powders/Naproxen/BC powders/Meloxicam/Diclofenac/Indomethacin and other Nonsteroidal anti-inflammatory medications as these will make you more likely to bleed and can cause stomach ulcers, can also cause Kidney problems.   2)Follow-up with Gastroenterologist on Monday, 11/19/2018 for repeat CBC/complete blood count and repeat BMP/kidney and electrolyte test  3)Call or return if any blood in stool or dark stools  4)Follow-up with your Pulmonologist Dr. Elsworth Soho to get another sleep study so you can get your CPAP machine  5)Hold Invokana and Losartan until after your kidney function test has been rechecked on Monday, 11/19/2018   Increase activity slowly   Complete by:  As directed         Discharge Medications     Allergies as of 11/16/2018   No Known Allergies     Medication List    TAKE these medications   acarbose 50 MG tablet Commonly known as:  PRECOSE Take 1 tablet (50 mg total) by mouth 3 (three) times daily with meals.   allopurinol 300 MG tablet Commonly known as:  ZYLOPRIM Take 1 tablet (300 mg total) by mouth daily.   amLODipine 10 MG tablet Commonly known as:  NORVASC Take 1 tablet (10 mg total) by mouth daily.   aspirin 81 MG chewable  tablet Chew 1 tablet (81 mg total) by mouth daily. Resume Tuesday 11/20/18 What changed:  additional instructions   bromocriptine 5 MG capsule Commonly known as:  PARLODEL TAKE 1 CAPSULE BY MOUTH EVERY DAY   canagliflozin 300 MG Tabs tablet Commonly known as:  INVOKANA Take 1 tablet (300 mg total) by mouth daily before breakfast. Hold until Monday 11/19/18 What changed:  additional instructions   doxazosin 8 MG tablet Commonly known as:  CARDURA Take 1 tablet (8 mg total) by mouth at bedtime.   fluticasone 50 MCG/ACT nasal spray Commonly known as:  FLONASE PLACE 1 SPRAY INTO BOTH NOSTRILS DAILY   glucose blood test strip Commonly known as:  ONE TOUCH ULTRA TEST USE TO TEST BLOOD USGAR TWICE A DAY   isosorbide mononitrate 30 MG 24 hr tablet Commonly known as:  IMDUR Take 1 tablet (30 mg total) by mouth daily. Start taking on:  November 17, 2018   losartan 100 MG tablet Commonly known as:  COZAAR Take 1 tablet (100 mg total) by mouth 2 (two) times daily. Restart on Sunday 11/18/18 What changed:  additional instructions   OZEMPIC (1 MG/DOSE) 2 MG/1.5ML Sopn Generic drug:  Semaglutide (1 MG/DOSE) INJECT 1 MG INTO THE SKIN ONCE A WEEK. What changed:  See the new instructions.   repaglinide 2 MG tablet Commonly known as:  PRANDIN TAKE 2 TABLETS BY MOUTH THREE TIMES DAILY BEFORE MEAL(S) What changed:  See the new instructions.   simvastatin 40 MG tablet Commonly known as:  ZOCOR Take 1 tablet (40 mg total) by mouth at bedtime.   traMADol 50 MG tablet Commonly known as:  ULTRAM TAKE 1/2 TAB BY MOUTH TWICE A DAY       Major procedures and Radiology Reports - PLEASE review detailed and final reports for all details, in brief -   Nm Gi Blood Loss  Result Date: 11/14/2018 CLINICAL DATA:  67 year old GI bleed.  Evaluate for bleeding source. EXAM: NUCLEAR MEDICINE GASTROINTESTINAL BLEEDING SCAN TECHNIQUE: Sequential abdominal images were obtained following intravenous  administration of Tc-33m labeled red blood cells. RADIOPHARMACEUTICALS:  Twenty-four mCi Tc-38m pertechnetate in-vitro labeled red cells. COMPARISON:  None. FINDINGS: Study has technical limitations due to patient motion throughout the examination. Small focus of uptake in the right lower quadrant does not move and finding is indeterminate. Large amount of penile uptake. Uptake in the expected vascular structures. IMPRESSION: Technically challenging examination without a clear source for GI bleeding. Small focus of uptake in the right lower quadrant is indeterminate. Electronically Signed   By: Markus Daft M.D.   On: 11/14/2018 14:30    Micro Results   Recent Results (from the past 240 hour(s))  MRSA PCR Screening     Status: None   Collection Time: 11/14/18  2:39 PM  Result Value Ref Range Status   MRSA by PCR NEGATIVE NEGATIVE Final    Comment:        The GeneXpert MRSA Assay (FDA approved for NASAL specimens only), is one component of a comprehensive MRSA colonization surveillance program. It is not intended to diagnose MRSA infection nor to guide or monitor treatment for MRSA infections. Performed at [facility], [place] [address], [place], [place] [address]        Today   Subjective    Bryan Haley today has no new complaints, wife at bedside, questions answered, tolerated transfusion of 2 units of PRBC  Well, had brown stool, no abdominal pain no dizziness no palpitations no chest pains no shortness of breath        Patient has been seen and examined prior to discharge   Objective   Blood pressure (!) 173/79, pulse 72, temperature 98.5 F (36.9 C), temperature source Oral, resp. rate 10, height 5\' 10"  (1.778 m), weight 113.2 kg, SpO2 97 %.   Intake/Output Summary (Last 24 hours) at 11/16/2018 1734 Last data filed  at 11/16/2018 1500 Gross per 24 hour  Intake 1508.23 ml  Output -  Net 1508.23 ml    Exam Gen:- Awake Alert,  obese HEENT:-  [place].AT, No sclera icterus Neck-Supple Neck,No JVD,.  Lungs-  CTAB , fair symmetrical air movement CV- S1, S2 normal, irregular  Abd-  +ve B.Sounds, Abd Soft, No tenderness,    Extremity/Skin:- No  edema, pedal pulses present  Psych-affect is appropriate, oriented x3 Neuro-no new focal deficits, no tremors   Data Review   CBC w Diff:  Lab Results  Component Value Date   WBC 7.1 11/16/2018   HGB 7.5 (L) 11/16/2018   HCT 22.8 (L) 11/16/2018   PLT 111 (L) 11/16/2018   LYMPHOPCT 19.4 12/12/2017   MONOPCT 7.3 12/12/2017   EOSPCT 1.1 12/12/2017   BASOPCT 0.3 12/12/2017    CMP:  Lab Results  Component Value Date   NA 144 11/16/2018   K 3.4 (L) 11/16/2018   CL 115 (H) 11/16/2018   CO2 21 (L) 11/16/2018   BUN 16 11/16/2018   CREATININE 1.92 (H) 11/16/2018   PROT 5.5 (L) 11/13/2018   ALBUMIN 3.4 (L) 11/13/2018   BILITOT 0.7 11/13/2018   ALKPHOS 64 11/13/2018   AST 27 11/13/2018   ALT 22 11/13/2018  .   Total Discharge time is about 33 minutes  Roxan Hockey M.D on 11/16/2018 at 5:34 PM  Pager---[phone]  Go to [email or web address] - password [email or web address] for contact info  [facility] - Office  [phone]

## 2018-11-16 NOTE — Progress Notes (Addendum)
Progress Note  Patient Name: Bryan Haley Date of Encounter: 11/16/2018  Primary Cardiologist:  Candee Furbish, MD  Subjective   No chest pain or SOB, no palpitations, agrees he was asleep for the slower HR.   Inpatient Medications    Scheduled Meds: . sodium chloride   Intravenous Once  . sodium chloride   Intravenous Once  . sodium chloride   Intravenous Once  . sodium chloride   Intravenous Once  . amLODipine  2.5 mg Oral Daily  . atorvastatin  20 mg Oral q1800  . insulin aspart  0-9 Units Subcutaneous Q4H  . isosorbide mononitrate  30 mg Oral Daily  . mouth rinse  15 mL Mouth Rinse BID  . sodium chloride flush  3 mL Intravenous Q12H   Continuous Infusions: . sodium chloride 50 mL/hr at 11/16/18 0700   PRN Meds: acetaminophen **OR** acetaminophen, ondansetron **OR** ondansetron (ZOFRAN) IV   Vital Signs    Vitals:   11/16/18 0400 11/16/18 0500 11/16/18 0651 11/16/18 0700  BP: (!) 132/36 (!) 151/42 (!) 166/62 (!) 165/55  Pulse: 68 64 71 61  Resp: 14 (!) 0 17 15  Temp:      TempSrc:      SpO2: 91% 90% 97% 98%  Weight:      Height:        Intake/Output Summary (Last 24 hours) at 11/16/2018 0816 Last data filed at 11/16/2018 0700 Gross per 24 hour  Intake 1128.17 ml  Output -  Net 1128.17 ml   Filed Weights   11/14/18 1440 11/15/18 0353 11/16/18 0351  Weight: 112.7 kg 113.2 kg 113.2 kg    Telemetry    SR, [place] I & II overnight, mostly 4-6 am - Personally Reviewed  ECG    None today - Personally Reviewed  Physical Exam   General: Well developed, well nourished, male appearing in no acute distress. Head: Normocephalic, atraumatic.  Neck: Supple without bruits, JVD not elevated. Lungs:  Resp regular and unlabored, CTA. Heart: RRR, S1, S2, no S3, S4, 2/6 murmur; no rub. Abdomen: Soft, non-tender, non-distended with normoactive bowel sounds. No hepatomegaly. No rebound/guarding. No obvious abdominal masses. Extremities: No clubbing, cyanosis,  no edema. Distal pedal pulses are 2+ bilaterally. Neuro: Alert and oriented X 3. Moves all extremities spontaneously. Psych: Normal affect.  Labs    Hematology Recent Labs  Lab 11/13/18 2048  11/14/18 1054  11/15/18 1630 11/15/18 2110 11/16/18 0328  WBC 16.5*  --  10.2  --   --   --  7.1  RBC 3.84*  --  2.35*  --   --   --  2.41*  HGB 12.0*   < > 7.3*   < > 7.9* 7.7* 7.5*  HCT 37.2*   < > 23.0*   < > 24.6* 23.7* 22.8*  MCV 96.9  --  97.9  --   --   --  94.6  MCH 31.3  --  31.1  --   --   --  31.1  MCHC 32.3  --  31.7  --   --   --  32.9  RDW 14.2  --  14.4  --   --   --  15.4  PLT 174  --  117*  --   --   --  111*   < > = values in this interval not displayed.    Chemistry Recent Labs  Lab 11/13/18 2048 11/14/18 1054 11/15/18 0915 11/16/18 0328  NA 140 142 140 144  K 3.5 4.0 3.7 3.4*  CL 111 117* 112* 115*  CO2 20* 17* 18* 21*  GLUCOSE 141* 233* 221* 132*  BUN 16 27* 26* 16  CREATININE 1.89* 3.45* 2.71* 1.92*  CALCIUM 7.9* 6.5* 7.3* 7.7*  PROT 5.5*  --   --   --   ALBUMIN 3.4*  --   --   --   AST 27  --   --   --   ALT 22  --   --   --   ALKPHOS 64  --   --   --   BILITOT 0.7  --   --   --   GFRNONAA 36* 17* 23* 35*  GFRAA 41* 20* 27* 41*  ANIONGAP 9 8 10 8      Cardiac EnzymesNo results for input(s): TROPONINI in the last 168 hours. No results for input(s): TROPIPOC in the last 168 hours.   BNPNo results for input(s): BNP, PROBNP in the last 168 hours.    Radiology    Nm Gi Blood Loss  Result Date: 11/14/2018 CLINICAL DATA:  67 year old GI bleed.  Evaluate for bleeding source. EXAM: NUCLEAR MEDICINE GASTROINTESTINAL BLEEDING SCAN TECHNIQUE: Sequential abdominal images were obtained following intravenous administration of Tc-13m labeled red blood cells. RADIOPHARMACEUTICALS:  Twenty-four mCi Tc-28m pertechnetate in-vitro labeled red cells. COMPARISON:  None. FINDINGS: Study has technical limitations due to patient motion throughout the examination. Small  focus of uptake in the right lower quadrant does not move and finding is indeterminate. Large amount of penile uptake. Uptake in the expected vascular structures. IMPRESSION: Technically challenging examination without a clear source for GI bleeding. Small focus of uptake in the right lower quadrant is indeterminate. Electronically Signed   By: Markus Daft M.D.   On: 11/14/2018 14:30     Cardiac Studies   ECHO: 11/15/2018 - Left ventricle: The cavity size was normal. There was mild   concentric hypertrophy. Systolic function was normal. The   estimated ejection fraction was in the range of 55% to 60%. Wall   motion was normal; there were no regional wall motion   abnormalities. Features are consistent with a pseudonormal left   ventricular filling pattern, with concomitant abnormal relaxation   and increased filling pressure (grade 2 diastolic dysfunction). - Mitral valve: There was mild regurgitation. Diastolic   regurgitation was present, consistent with AV dyssynchrony. - Left atrium: The atrium was moderately to severely dilated. - Right ventricle: The cavity size was moderately dilated. Wall   thickness was normal. - Right atrium: The atrium was mildly dilated.  Patient Profile     67 y.o. male w/ hx PCI/DES to prox PLA and pRCA in 2015, HTN, HLD, DM type 2, OSA on CPAP, and CKD stage 3 was admitted 12/18 with hematochezia.  Assessment & Plan    1.  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  Hematochezia:  -Colonoscopy 12/17 with 6 polyps removed - Colonoscopy 12/19 with 3 ulcers in the cecum, 1 in the ascending colon and  1 in the transverse colon, multiple 8-20 mm ulcers were found in the descending and transverse colon.  Polyps also seen but not removed -Per GI/IM  3. ABL Anemia: - for transfusion today - per IM  4. OSA, not on CPAP -says machine broke, has not used in a while - Dr Elsworth Soho was MD - agrees to see him and get back on CPAP  Otherwise, per IM, possible d/c today after transfusion Principal Problem:   Hematochezia Active Problems:   Dyslipidemia associated with type 2 diabetes mellitus ([place])   Hypertension associated with diabetes ([place])   CKD stage 3 due to type 2 diabetes mellitus ([place])   Sleep apnea   COLONIC POLYPS, HX OF   CAD (coronary artery disease)   Diabetes ([place])   GI bleed    Signed, Rosaria Ferries , PA-C 8:16 AM 11/16/2018 Pager: [phone]   I have seen and examined the patient along with Rosaria Ferries , PA-CP.  I have reviewed the chart, notes and new data.  I agree with PA/NP's note.  Key new complaints: no CV complaints, no symptoms of bradycardia Key examination changes: BP is high. Only received 2.5 mg amlodipine (homew dose is 10 mg daily) and losartan is on hold due to AKI. Key new findings / data: rapidly improving creatinine, not quite back to baseline  PLAN: Resume full dose of amlodipine, hold losartan one more day.  Sanda Klein, MD, [place] [phone] 11/16/2018, 11:50 AM   [facility] will sign off.   Medication Recommendations:  Amlodipine 10 mg daily starting today, losartan 100 mg daily starting tomorrow. Resume aspirin per GI recommendations Other recommendations (labs, testing, etc):  Recheck Hgb and BMET as an outpatient in about a week. Follow up as an outpatient:  12/30/20199 with Dr. Marlou Porch

## 2018-11-16 NOTE — Progress Notes (Signed)
Blood transfusion record in Epic not complete due to computer error during adminstrastion. 2nd unit of blood infused with no distress noted. Infused per policy with Bryan Haley being the second verifier. See Flow sheet for Vital records. Bryan Haley made aware of documentation error. Both units infused per policy with no distress noted. 2nd unit of Blood was unable to be complete due to computer error. Unit number [id number] infused with no distress noted. Will continue to monitor.

## 2018-11-17 LAB — TYPE AND SCREEN
ABO/RH(D): O NEG
Antibody Screen: NEGATIVE
Unit division: 0
Unit division: 0
Unit division: 0
Unit division: 0
Unit division: 0
Unit division: 0

## 2018-11-17 LAB — BPAM RBC
Blood Product Expiration Date: 202001102359
Blood Product Expiration Date: 202001102359
Blood Product Expiration Date: 202001112359
Blood Product Expiration Date: 202001112359
Blood Product Expiration Date: 202001132359
Blood Product Expiration Date: 202001152359
ISSUE DATE / TIME: 201912180255
ISSUE DATE / TIME: 201912180602
ISSUE DATE / TIME: 201912181512
ISSUE DATE / TIME: 201912200958
ISSUE DATE / TIME: 201912201242
ISSUE DATE / TIME: 201912181740
UNIT TYPE AND RH: 5100
Unit Type and Rh: 5100
Unit Type and Rh: 5100
Unit Type and Rh: 5100
Unit Type and Rh: 5100
Unit Type and Rh: 5100

## 2018-11-19 ENCOUNTER — Encounter: Payer: Self-pay | Admitting: Family Medicine

## 2018-11-19 ENCOUNTER — Other Ambulatory Visit (INDEPENDENT_AMBULATORY_CARE_PROVIDER_SITE_OTHER): Payer: Managed Care, Other (non HMO)

## 2018-11-19 ENCOUNTER — Ambulatory Visit: Payer: Managed Care, Other (non HMO) | Admitting: Family Medicine

## 2018-11-19 VITALS — BP 132/64 | HR 65 | Temp 98.0°F | Ht 70.0 in | Wt 251.2 lb

## 2018-11-19 DIAGNOSIS — I10 Essential (primary) hypertension: Secondary | ICD-10-CM

## 2018-11-19 DIAGNOSIS — E1159 Type 2 diabetes mellitus with other circulatory complications: Secondary | ICD-10-CM | POA: Diagnosis not present

## 2018-11-19 DIAGNOSIS — E1122 Type 2 diabetes mellitus with diabetic chronic kidney disease: Secondary | ICD-10-CM | POA: Diagnosis not present

## 2018-11-19 DIAGNOSIS — I152 Hypertension secondary to endocrine disorders: Secondary | ICD-10-CM

## 2018-11-19 DIAGNOSIS — N183 Chronic kidney disease, stage 3 (moderate): Secondary | ICD-10-CM

## 2018-11-19 DIAGNOSIS — K922 Gastrointestinal hemorrhage, unspecified: Secondary | ICD-10-CM | POA: Diagnosis not present

## 2018-11-19 LAB — CBC WITH DIFFERENTIAL/PLATELET
Basophils Absolute: 0 10*3/uL (ref 0.0–0.1)
Basophils Relative: 0.2 % (ref 0.0–3.0)
EOS PCT: 1.2 % (ref 0.0–5.0)
Eosinophils Absolute: 0.1 10*3/uL (ref 0.0–0.7)
HCT: 29.1 % — ABNORMAL LOW (ref 39.0–52.0)
Hemoglobin: 10 g/dL — ABNORMAL LOW (ref 13.0–17.0)
LYMPHS PCT: 19.3 % (ref 12.0–46.0)
Lymphs Abs: 1.2 10*3/uL (ref 0.7–4.0)
MCHC: 34.5 g/dL (ref 30.0–36.0)
MCV: 90 fl (ref 78.0–100.0)
Monocytes Absolute: 0.5 10*3/uL (ref 0.1–1.0)
Monocytes Relative: 8.6 % (ref 3.0–12.0)
Neutro Abs: 4.4 10*3/uL (ref 1.4–7.7)
Neutrophils Relative %: 70.7 % (ref 43.0–77.0)
Platelets: 152 10*3/uL (ref 150.0–400.0)
RBC: 3.23 Mil/uL — ABNORMAL LOW (ref 4.22–5.81)
RDW: 14.7 % (ref 11.5–15.5)
WBC: 6.3 10*3/uL (ref 4.0–10.5)

## 2018-11-19 NOTE — Assessment & Plan Note (Addendum)
No signs of recurrence.  Hemoglobin 10.5 today in GI office.  He will follow-up with him soon.  Discussed reasons to return to care and seek emergent care.

## 2018-11-19 NOTE — Progress Notes (Signed)
Subjective:  Bryan Haley is a 67 y.o. male who presents today for a TCM visit.  HPI:  Summary of Hospital admission: Reason for admission: Symptomatic GI bleed Date of admission: 11/13/2018 Date of discharge: 11/16/2018 Date of Interactive contact: N/A - today is first business day since discharge Summary of Hospital course: Patient presented to the ED on 11/13/2018 with several episodes of large-volume hematochezia after undergoing colonoscopy earlier that day in which 27 polyps were resected.  He had some associated lightheadedness with no syncope.  On admission, hemoglobin was found to be 7.1.  He was given 4 units of packed red blood cells with improvement of hemoglobin to 9.1.  On the day of discharge, hemoglobin had trended back down to 7.5 and he was given 2 additional units of packed red blood cells for a total of 6 units.  During hospitalization he underwent repeat colonoscopy and had 29 clips placed at the sites of biopsy.  Patient was additionally found to be hypertensive while admitted and was started on Imdur 30 mg daily.  He was found to have acute on chronic kidney disease likely secondary to hypotension hypovolemia.  Creatinine was monitored during hospitalization and it trended back down to near his baseline.  Interim History:   GI Bleed Patient has done well since being discharged home 3 days ago.  Has not had any further episodes of GI bleeding.  Had blood work checked this morning at his gastroenterologist office.  Still has elevated fatigue which is improving.  No reported nausea or vomiting.  HTN Blood pressure medication since being discharged.  He took losartan 100 mg this morning.  His other blood pressure medications include Cardura 8 mg at bedtime and Imdur 30 mg daily.  No chest pain or shortness of breath.  He is overall doing well.  ROS: Per HPI, otherwise a complete review of systems was negative.   PMH:  The following were reviewed and  entered/updated in epic: Past Medical History:  Diagnosis Date  . Allergy   . Arthritis    feet  . COLONIC POLYPS, HX OF 10/02/2008  . Coronary artery disease   . Coronary atherosclerosis of native coronary artery 12/05/2013   Prox RCA, and PLA DES 12/04/13.   . Diabetic retinopathy of both eyes ([place])    Laser surgery, Dr. Zadie Rhine  . 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 Exertional shortness of breath    "last 2-3 yrs" (12/04/2013)  . GOUT 07/11/2007  . Hyperlipidemia   . HYPERTENSION 07/06/2007  . HYPOGONADISM 06/11/2010  . OSA (obstructive sleep apnea) 10/08/2009   wears CPAP "more than 1/2 the time"  . Other and unspecified angina pectoris   . RENAL INSUFFICIENCY 12/11/2008  . Sleep apnea    no cpap    Patient Active Problem List   Diagnosis Date Noted  . GI bleed 11/14/2018  . Systolic murmur 01/77/9390  . BPH associated with nocturia 12/13/2017  . Diabetes ([place]) 11/04/2015  . First degree AV block 05/01/2015  . CAD (coronary artery disease) 05/15/2014  . Morbid obesity ([place]) 11/06/2013  . Former smoker 11/06/2013  . Family history of ischemic heart disease 11/06/2013  . Contact dermatitis and eczema due to plant 11/04/2013  . Allergic rhinitis 08/22/2012  . 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 Sleep apnea 10/08/2009  . CKD stage 3 due to type 2 diabetes mellitus ([place]) 12/11/2008  . COLONIC POLYPS, HX OF  10/02/2008  . 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 Gout 07/11/2007  . Dyslipidemia associated with type 2 diabetes mellitus ([place]) 07/06/2007  . Hypertension associated with diabetes ([place]) 07/06/2007   Past Surgical History:  Procedure Laterality Date  . CARPAL TUNNEL RELEASE Left ~ 2004  . COLONOSCOPY    . COLONOSCOPY WITH PROPOFOL N/A 11/14/2018   Procedure: COLONOSCOPY WITH PROPOFOL;  Surgeon: Rush Landmark Telford Nab., MD;  Location: [facility];  Service: Gastroenterology;  Laterality: N/A;  . CORONARY ANGIOPLASTY WITH STENT  PLACEMENT  12/04/2013   "2 stents"  . EYE EXAMINATION UNDER ANESTHESIA W/ RETINAL CRYOTHERAPY AND RETINAL LASER Bilateral 2005-2010   "for my diabetes" (12/04/2013)  . HOT HEMOSTASIS N/A 11/14/2018   Procedure: HOT HEMOSTASIS (ARGON PLASMA COAGULATION/BICAP);  Surgeon: Irving Copas., MD;  Location: [facility];  Service: Gastroenterology;  Laterality: N/A;  . LEFT HEART CATHETERIZATION WITH CORONARY ANGIOGRAM Bilateral 12/04/2013   Procedure: LEFT HEART CATHETERIZATION WITH CORONARY ANGIOGRAM;  Surgeon: Candee Furbish, MD;  Location: [facility] CATH LAB;  Service: Cardiovascular;  Laterality: Bilateral;  . POLYPECTOMY    . SUBMUCOSAL INJECTION  11/14/2018   Procedure: SUBMUCOSAL INJECTION;  Surgeon: Rush Landmark Telford Nab., MD;  Location: [facility];  Service: Gastroenterology;;    Family History  Problem Relation Age of Onset  . Cancer Mother   . Pancreatic cancer Mother   . Heart disease Father   . Heart attack Father   . Diabetes Other   . Hyperlipidemia Other   . Hypertension Other   . Rectal cancer Sister   . Colon cancer Neg Hx   . Esophageal cancer Neg Hx   . Stomach cancer Neg Hx   . Stroke Neg Hx   . Colon polyps Neg Hx     Medications- Reconciled discharge and current medications in Epic.  Current Outpatient Medications  Medication Sig Dispense Refill  . acarbose (PRECOSE) 50 MG tablet Take 1 tablet (50 mg total) by mouth 3 (three) times daily with meals. 270 tablet 3  . allopurinol (ZYLOPRIM) 300 MG tablet Take 1 tablet (300 mg total) by mouth daily. 100 tablet 4  . amLODipine (NORVASC) 10 MG tablet Take 1 tablet (10 mg total) by mouth daily. 30 tablet 3  . aspirin 81 MG chewable tablet Chew 1 tablet (81 mg total) by mouth daily. Resume Tuesday 11/20/18    . bromocriptine (PARLODEL) 5 MG capsule TAKE 1 CAPSULE BY MOUTH EVERY DAY 90 capsule 1  . canagliflozin (INVOKANA) 300 MG TABS tablet Take 1 tablet (300 mg total) by mouth daily before breakfast. Hold until Monday  11/19/18 30 tablet 11  . doxazosin (CARDURA) 8 MG tablet Take 1 tablet (8 mg total) by mouth at bedtime. 100 tablet 3  . fluticasone (FLONASE) 50 MCG/ACT nasal spray PLACE 1 SPRAY INTO BOTH NOSTRILS DAILY 32 g 10  . glucose blood (ONE TOUCH ULTRA TEST) test strip USE TO TEST BLOOD USGAR TWICE A DAY 100 each 3  . isosorbide mononitrate (IMDUR) 30 MG 24 hr tablet Take 1 tablet (30 mg total) by mouth daily. 30 tablet 2  . OZEMPIC 1 MG/DOSE SOPN INJECT 1 MG INTO THE SKIN ONCE A WEEK. (Patient taking differently: Inject 1 mg into the skin once a week. ) 12 pen 3  . repaglinide (PRANDIN) 2 MG tablet TAKE 2 TABLETS BY MOUTH THREE TIMES DAILY BEFORE MEAL(S) (Patient taking differently: Take 2 mg by mouth 3 (three) times daily before meals. ) 540 tablet 0  . simvastatin (ZOCOR) 40 MG tablet Take 1  tablet (40 mg total) by mouth at bedtime. 100 tablet 4  . traMADol (ULTRAM) 50 MG tablet TAKE 1/2 TAB BY MOUTH TWICE A DAY 100 tablet 1   No current facility-administered medications for this visit.     Allergies-reviewed and updated No Known Allergies  Social History   Socioeconomic History  . Marital status: Married    Spouse name: Not on file  . Number of children: Not on file  . Years of education: Not on file  . Highest education level: Not on file  Occupational History  . Occupation: Geographical information systems officer  . Financial resource strain: Not on file  . Food insecurity:    Worry: Not on file    Inability: Not on file  . Transportation needs:    Medical: Not on file    Non-medical: Not on file  Tobacco Use  . Smoking status: Former Smoker    Packs/day: 1.50    Years: 45.00    Pack years: 67.50    Types: Cigarettes    Last attempt to quit: 10/02/2010    Years since quitting: 8.1  . Smokeless tobacco: Never Used  Substance and Sexual Activity  . Alcohol use: Yes    Alcohol/week: 1.0 standard drinks    Types: 1 Shots of liquor per week  . Drug use: No  . Sexual activity: Yes  Lifestyle    . Physical activity:    Days per week: Not on file    Minutes per session: Not on file  . Stress: Not on file  Relationships  . Social connections:    Talks on phone: Not on file    Gets together: Not on file    Attends religious service: Not on file    Active member of club or organization: Not on file    Attends meetings of clubs or organizations: Not on file    Relationship status: Not on file  Other Topics Concern  . Not on file  Social History Narrative   Regular exercise-no   Pt went to seminar to quit smoking   Objective:  Physical Exam: BP 132/64 (BP Location: Left Arm, Patient Position: Sitting, Cuff Size: Large)   Pulse 65   Temp 98 F (36.7 C) (Oral)   Ht 5\' 10"  (1.778 m)   Wt 251 lb 4 oz (114 kg)   SpO2 94%   BMI 36.05 kg/m   Gen: NAD, resting comfortably CV: RRR with no murmurs appreciated Pulm: NWOB, CTAB with no crackles, wheezes, or rhonchi GI: Normal bowel sounds present. Soft, Nontender, Nondistended. MSK: No edema, cyanosis, or clubbing noted Skin: Warm, dry Neuro: Grossly normal, moves all extremities Psych: Normal affect and thought content  Assessment/Plan:  Hypertension associated with diabetes ([place]) At goal today.  It appears as if patient was taking losartan 100 mg twice daily prior to his admission-think this likely contributed to his AKI. Will stop completely. Continue imdur 30mg  daily and cardura 8mg  daily. Continue home BP monitoring with goal 140/90. Follow up with me in a few weeks for CPE.   GI bleed No signs of recurrence.  Hemoglobin 10.5 today in GI office.  He will follow-up with him soon.  Discussed reasons to return to care and seek emergent care.  CKD stage 3 due to type 2 diabetes mellitus (HCC) Creatinine back to near baseline at discharge.  Unfortunately, BMET was not drawn with his blood draw this morning.  He declines any further blood draws today.  Given that his creatinine  at discharge was back to baseline, I think it is  okay to wait for a few more weeks.  Stop losartan as noted above.  Encouraged good oral hydration.  Follow-up with me in a few weeks for CPE.  Check treatment with next blood draw.  Patient has a moderate level of medical complexity due to number of diagnoses/treatment options and amount/complexity of data reviewed.   Algis Greenhouse. Jerline Pain, MD 11/19/2018 2:40 PM

## 2018-11-19 NOTE — Assessment & Plan Note (Signed)
Creatinine back to near baseline at discharge.  Unfortunately, BMET was not drawn with his blood draw this morning.  He declines any further blood draws today.  Given that his creatinine at discharge was back to baseline, I think it is okay to wait for a few more weeks.  Stop losartan as noted above.  Encouraged good oral hydration.  Follow-up with me in a few weeks for CPE.  Check treatment with next blood draw.

## 2018-11-19 NOTE — Assessment & Plan Note (Signed)
At goal today.  It appears as if patient was taking losartan 100 mg twice daily prior to his admission-think this likely contributed to his AKI. Will stop completely. Continue imdur 30mg  daily and cardura 8mg  daily. Continue home BP monitoring with goal 140/90. Follow up with me in a few weeks for CPE.

## 2018-11-19 NOTE — Patient Instructions (Signed)
It was very nice to see you today!  Stop the losartan.  No other changes today.  Come back to see me in a few weeks for your physical.  Please let me know if your BP is persistently 140/90 or higher over the next few weeks.   Take care, Dr Jerline Pain

## 2018-11-26 ENCOUNTER — Ambulatory Visit: Payer: Managed Care, Other (non HMO) | Admitting: Cardiology

## 2018-11-26 ENCOUNTER — Encounter: Payer: Self-pay | Admitting: Cardiology

## 2018-11-26 VITALS — BP 140/64 | HR 61 | Ht 70.0 in | Wt 248.2 lb

## 2018-11-26 DIAGNOSIS — I2583 Coronary atherosclerosis due to lipid rich plaque: Secondary | ICD-10-CM | POA: Diagnosis not present

## 2018-11-26 DIAGNOSIS — I208 Other forms of angina pectoris: Secondary | ICD-10-CM | POA: Diagnosis not present

## 2018-11-26 DIAGNOSIS — I10 Essential (primary) hypertension: Secondary | ICD-10-CM

## 2018-11-26 DIAGNOSIS — I2510 Atherosclerotic heart disease of native coronary artery without angina pectoris: Secondary | ICD-10-CM

## 2018-11-26 DIAGNOSIS — I440 Atrioventricular block, first degree: Secondary | ICD-10-CM

## 2018-11-26 DIAGNOSIS — E119 Type 2 diabetes mellitus without complications: Secondary | ICD-10-CM

## 2018-11-26 NOTE — Progress Notes (Signed)
Cardiology Office Note:    Date:  11/26/2018   ID:  TU BAYLE, DOB 09-26-1951, MRN [id number]  PCP:  Vivi Barrack, MD  Cardiologist:  Candee Furbish, MD  Electrophysiologist:  None   Referring MD: Dorena Cookey, MD     History of Present Illness:    Bryan Haley is a 68 y.o. male here for follow-up of coronary artery disease.  Back in 2015 had intervention to the proximal PLA as well as proximal RCA with DES.  Left radial approach.  No fevers chills nausea vomiting syncope bleeding.  Isosorbide had been discontinued.  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  He was in the hospital in December 2019 with GI bleed.  NSAID related.  Also had acute kidney injury creatinine was 1.89 on admission then increased to 3.45.  Holding losartan.  Isosorbide was started to help with blood pressure but this was stopped at clinic visit today 11/26/2018.  Overall he has been feeling quite well.  He appreciated seeing Dr. Sallyanne Kuster in the hospital.  Past Medical History:  Diagnosis Date  . Allergy   . Arthritis    feet  . COLONIC POLYPS, HX OF 10/02/2008  . Coronary artery disease   . Coronary atherosclerosis of native coronary artery 12/05/2013   Prox RCA, and PLA DES 12/04/13.   . Diabetic retinopathy of both eyes ([place])    Laser surgery, Dr. Zadie Rhine  . 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 Exertional shortness of breath    "last 2-3 yrs" (12/04/2013)  . GOUT 07/11/2007  . Hyperlipidemia   . HYPERTENSION 07/06/2007  . HYPOGONADISM 06/11/2010  . OSA (obstructive sleep apnea) 10/08/2009   wears CPAP "more than 1/2 the time"  . Other and unspecified angina pectoris   . RENAL INSUFFICIENCY 12/11/2008  . Sleep apnea    no cpap     Past Surgical History:  Procedure Laterality Date  . CARPAL TUNNEL RELEASE Left ~ 2004  . COLONOSCOPY    . COLONOSCOPY WITH  PROPOFOL N/A 11/14/2018   Procedure: COLONOSCOPY WITH PROPOFOL;  Surgeon: Rush Landmark Telford Nab., MD;  Location: [facility];  Service: Gastroenterology;  Laterality: N/A;  . CORONARY ANGIOPLASTY WITH STENT PLACEMENT  12/04/2013   "2 stents"  . EYE EXAMINATION UNDER ANESTHESIA W/ RETINAL CRYOTHERAPY AND RETINAL LASER Bilateral 2005-2010   "for my diabetes" (12/04/2013)  . HOT HEMOSTASIS N/A 11/14/2018   Procedure: HOT HEMOSTASIS (ARGON PLASMA COAGULATION/BICAP);  Surgeon: Irving Copas., MD;  Location: [facility];  Service: Gastroenterology;  Laterality: N/A;  . LEFT HEART CATHETERIZATION WITH CORONARY ANGIOGRAM Bilateral 12/04/2013   Procedure: LEFT HEART CATHETERIZATION WITH CORONARY ANGIOGRAM;  Surgeon: Candee Furbish, MD;  Location: [facility] CATH LAB;  Service: Cardiovascular;  Laterality: Bilateral;  . POLYPECTOMY    . SUBMUCOSAL INJECTION  11/14/2018   Procedure: SUBMUCOSAL INJECTION;  Surgeon: Irving Copas., MD;  Location: [facility];  Service: Gastroenterology;;    Current Medications: Current Meds  Medication Sig  . acarbose (PRECOSE) 50 MG tablet Take 1 tablet (50 mg total) by mouth 3 (three) times daily with meals.  Marland Kitchen allopurinol (ZYLOPRIM) 300 MG tablet Take 1 tablet (300 mg total) by mouth daily.  Marland Kitchen amLODipine (NORVASC) 10 MG tablet Take 1 tablet (10 mg total) by mouth daily.  Marland Kitchen aspirin 81 MG chewable tablet Chew 1 tablet (81 mg total) by mouth  daily. Resume Tuesday 11/20/18  . bromocriptine (PARLODEL) 5 MG capsule TAKE 1 CAPSULE BY MOUTH EVERY DAY  . canagliflozin (INVOKANA) 300 MG TABS tablet Take 1 tablet (300 mg total) by mouth daily before breakfast. Hold until Monday 11/19/18  . doxazosin (CARDURA) 8 MG tablet Take 1 tablet (8 mg total) by mouth at bedtime.  . fluticasone (FLONASE) 50 MCG/ACT nasal spray PLACE 1 SPRAY INTO BOTH NOSTRILS DAILY  . glucose blood (ONE TOUCH ULTRA TEST) test strip USE TO TEST BLOOD USGAR TWICE A DAY  . OZEMPIC 1 MG/DOSE SOPN INJECT  1 MG INTO THE SKIN ONCE A WEEK.  . repaglinide (PRANDIN) 2 MG tablet TAKE 2 TABLETS BY MOUTH THREE TIMES DAILY BEFORE MEAL(S)  . simvastatin (ZOCOR) 40 MG tablet Take 1 tablet (40 mg total) by mouth at bedtime.  . traMADol (ULTRAM) 50 MG tablet TAKE 1/2 TAB BY MOUTH TWICE A DAY  . [DISCONTINUED] isosorbide mononitrate (IMDUR) 30 MG 24 hr tablet Take 1 tablet (30 mg total) by mouth daily.     Allergies:   Patient has no known allergies.   Social History   Socioeconomic History  . Marital status: Married    Spouse name: Not on file  . Number of children: Not on file  . Years of education: Not on file  . Highest education level: Not on file  Occupational History  . Occupation: Geographical information systems officer  . Financial resource strain: Not on file  . Food insecurity:    Worry: Not on file    Inability: Not on file  . Transportation needs:    Medical: Not on file    Non-medical: Not on file  Tobacco Use  . Smoking status: Former Smoker    Packs/day: 1.50    Years: 45.00    Pack years: 67.50    Types: Cigarettes    Last attempt to quit: 10/02/2010    Years since quitting: 8.1  . Smokeless tobacco: Never Used  Substance and Sexual Activity  . Alcohol use: Yes    Alcohol/week: 1.0 standard drinks    Types: 1 Shots of liquor per week  . Drug use: No  . Sexual activity: Yes  Lifestyle  . Physical activity:    Days per week: Not on file    Minutes per session: Not on file  . Stress: Not on file  Relationships  . Social connections:    Talks on phone: Not on file    Gets together: Not on file    Attends religious service: Not on file    Active member of club or organization: Not on file    Attends meetings of clubs or organizations: Not on file    Relationship status: Not on file  Other Topics Concern  . Not on file  Social History Narrative   Regular exercise-no   Pt went to seminar to quit smoking     Family History: The patient's family history includes Cancer in his  mother; Diabetes in an other family member; Heart attack in his father; Heart disease in his father; Hyperlipidemia in an other family member; Hypertension in an other family member; Pancreatic cancer in his mother; Rectal cancer in his sister. There is no history of Colon cancer, Esophageal cancer, Stomach cancer, Stroke, or Colon polyps.  ROS:   Please see the history of present illness.    No fevers chills nausea vomiting syncope all other systems reviewed and are negative.  EKGs/Labs/Other Studies Reviewed:    The following  studies were reviewed today:  ECHO 11/15/18 - Left ventricle: The cavity size was normal. There was mild   concentric hypertrophy. Systolic function was normal. The   estimated ejection fraction was in the range of 55% to 60%. Wall   motion was normal; there were no regional wall motion   abnormalities. Features are consistent with a pseudonormal left   ventricular filling pattern, with concomitant abnormal relaxation   and increased filling pressure (grade 2 diastolic dysfunction). - Mitral valve: There was mild regurgitation. Diastolic   regurgitation was present, consistent with AV dyssynchrony. - Left atrium: The atrium was moderately to severely dilated. - Right ventricle: The cavity size was moderately dilated. Wall   thickness was normal. - Right atrium: The atrium was mildly dilated.  EKG:  EKG is  ordered today.  The ekg ordered today demonstrates sinus rhythm first-degree AV block 342 bifascicular block  Recent Labs: 11/13/2018: ALT 22 11/15/2018: Magnesium 1.9; TSH 3.206 11/16/2018: BUN 16; Creatinine, Ser 1.92; Potassium 3.4; Sodium 144 11/19/2018: Hemoglobin 10.0; Platelets 152.0  Recent Lipid Panel    Component Value Date/Time   CHOL 122 12/12/2017 0942   TRIG 298.0 (H) 12/12/2017 0942   HDL 29.20 (L) 12/12/2017 0942   CHOLHDL 4 12/12/2017 0942   VLDL 59.6 (H) 12/12/2017 0942   LDLCALC 34 09/10/2010 0833   LDLDIRECT 52.0 12/12/2017 0942     Physical Exam:    VS:  BP 140/64   Pulse 61   Ht 5\' 10"  (1.778 m)   Wt 248 lb 3.2 oz (112.6 kg)   BMI 35.61 kg/m     Wt Readings from Last 3 Encounters:  11/26/18 248 lb 3.2 oz (112.6 kg)  11/19/18 251 lb 4 oz (114 kg)  11/16/18 249 lb 9 oz (113.2 kg)     GEN: Overweight well nourished, well developed in no acute distress HEENT: Normal NECK: No JVD; No carotid bruits LYMPHATICS: No lymphadenopathy CARDIAC: RRR, 1/6 SM, rubs, gallops RESPIRATORY:  Clear to auscultation without rales, wheezing or rhonchi  ABDOMEN: Soft, non-tender, non-distended MUSCULOSKELETAL:  No edema; No deformity  SKIN: Warm and dry NEUROLOGIC:  Alert and oriented x 3 PSYCHIATRIC:  Normal affect   ASSESSMENT:    1. Coronary artery disease due to lipid rich plaque   2. Angina decubitus ([place])   3. Morbid obesity ([place])   4. First degree AV block   5. Diabetes mellitus with coincident hypertension (HCC)    PLAN:    In order of problems listed above:  Coronary artery disease - RCA, PDA.  Doing quite well without any exertional symptoms.  Left radial approach in future.  First-degree AV block - PR interval is 342 ms.  Has a bifascicular block as well.  No syncope.  Prior PR interval 310 ms. -Avoid AV nodal blocking agents.  During hospital stay in December 2019 he did have bradycardia at times down to the 30s and brief nonsustained VT runs.  He was seen by Dr. Lorenza Cambridge who thought that most of his episodes were actual AV block, Mobitz type I.  None were associated with bradycardia, all were asymptomatic.  He recommended treating obstructive sleep apnea.  Did not think a pacemaker was indicated at this time.  Angina - Now off of the isosorbide.  Doing well.  Continue with current management  Essential hypertension - Blood pressure mildly elevated today in the 710 systolic range.  Continue with amlodipine. His losartan is on hold because of recent acute kidney injury in the setting  of GI bleed.   Dr. Jerline Pain is going to try to resume this when renal function is stable. -I will stop his isosorbide 30 mg.  This is not a usual antihypertensive.  He has not been having any anginal symptoms.  Hyperlipidemia -Zocor 40.  No changes.  Morbid obesity -BMI greater than 35 with 2 or more comorbidities.  Continue to encourage weight loss.     Medication Adjustments/Labs and Tests Ordered: Current medicines are reviewed at length with the patient today.  Concerns regarding medicines are outlined above.  Orders Placed This Encounter  Procedures  . EKG 12-Lead   No orders of the defined types were placed in this encounter.   Patient Instructions  Medication Instructions:  Please discontinue Isosorbide and continue all other medications as listed.  If you need a refill on your cardiac medications before your next appointment, please call your pharmacy.   Follow-Up: At [facility], you and your health needs are our priority.  As part of our continuing mission to provide you with exceptional heart care, we have created designated Provider Care Teams.  These Care Teams include your primary Cardiologist (physician) and Advanced Practice Providers (APPs -  Physician Assistants and Nurse Practitioners) who all work together to provide you with the care you need, when you need it. You will need a follow up appointment in 12 months.  Please call our office 2 months in advance to schedule this appointment.  You may see Candee Furbish, MD or one of the following Advanced Practice Providers on your designated Care Team:   Truitt Merle, NP Cecilie Kicks, NP . Kathyrn Drown, NP  Thank you for choosing [facility]!!         Signed, Candee Furbish, MD  11/26/2018 10:27 AM    [place]

## 2018-11-26 NOTE — Patient Instructions (Signed)
Medication Instructions:  Please discontinue Isosorbide and continue all other medications as listed.  If you need a refill on your cardiac medications before your next appointment, please call your pharmacy.   Follow-Up: At [facility], you and your health needs are our priority.  As part of our continuing mission to provide you with exceptional heart care, we have created designated Provider Care Teams.  These Care Teams include your primary Cardiologist (physician) and Advanced Practice Providers (APPs -  Physician Assistants and Nurse Practitioners) who all work together to provide you with the care you need, when you need it. You will need a follow up appointment in 12 months.  Please call our office 2 months in advance to schedule this appointment.  You may see Candee Furbish, MD or one of the following Advanced Practice Providers on your designated Care Team:   Truitt Merle, NP Cecilie Kicks, NP . Kathyrn Drown, NP  Thank you for choosing [facility]!!

## 2018-12-12 ENCOUNTER — Encounter: Payer: Self-pay | Admitting: Pulmonary Disease

## 2018-12-12 ENCOUNTER — Ambulatory Visit (INDEPENDENT_AMBULATORY_CARE_PROVIDER_SITE_OTHER): Payer: Managed Care, Other (non HMO) | Admitting: Pulmonary Disease

## 2018-12-12 VITALS — BP 122/84 | HR 67 | Ht 70.0 in | Wt 245.0 lb

## 2018-12-12 DIAGNOSIS — I10 Essential (primary) hypertension: Secondary | ICD-10-CM

## 2018-12-12 DIAGNOSIS — G4733 Obstructive sleep apnea (adult) (pediatric): Secondary | ICD-10-CM | POA: Diagnosis not present

## 2018-12-12 DIAGNOSIS — E1159 Type 2 diabetes mellitus with other circulatory complications: Secondary | ICD-10-CM | POA: Diagnosis not present

## 2018-12-12 NOTE — Assessment & Plan Note (Signed)
We will need to reassess degree of sleep disordered breathing, will schedule home sleep study.  Based on this he will need a titration study given his prior problems and high pressure required.  This will also provide as the opportunity for mask desensitization I am hopeful that using auto settings we will be able to get him more comfortable this time I discussed alternatives with him, he would not be a candidate for inspire device due to obesity  Weight loss encouraged, compliance with goal of at least 4-6 hrs every night is the expectation. Advised against medications with sedative side effects Cautioned against driving when sleepy - understanding that sleepiness will vary on a day to day basis

## 2018-12-12 NOTE — Assessment & Plan Note (Addendum)
The pathophysiology of obstructive sleep apnea , it's cardiovascular consequences & modes of treatment including CPAP were discused with the patient in detail & they evidenced understanding.

## 2018-12-12 NOTE — Progress Notes (Signed)
Subjective:    Patient ID: Bryan Haley, male    DOB: July 20, 1951, 68 y.o.   MRN: [id number]  HPI  Chief Complaint  Patient presents with  . Sleep Consult    Self referral for OSA. He had stopped using his machine due to losing weight.    68 year old hypertensive, diabetic presents to reestablish care for management of obstructive sleep apnea. He was diagnosed with severe OSA in 2011, required high pressures on BiPAP during a titration study and actually required BiPAP 24/16 since he could not tolerate high pressures.  We set him up on auto CPAP settings with a large full facemask and he is use this for.  Time was 40 stopped.  He states that the machine would start up in the lower pressure.  Will increase to the night and this would wake him up.  He could never really get comfortable on this. He was recently hospitalized for lower GI bleed and hospital staff noted that he stop breathing in his sleep.  He has also been told that there is new equipment available now which is more comfortable and he would like to get checked out again.  Epworth sleepiness score is 9 and he reports sleepiness while sitting and reading, watching TV or sitting inactive in a public place. He works in [employer] and an 8-5 job and stays busy.  Bedtime is around 10 PM, sleep latency is minimal, he sleeps on his side with one pillow, reports 3-4 nocturnal awakenings including nocturia and is out of bed by 6 AM feeling rested without dryness of mouth or headaches.  He has lost about 30 pounds from 274 to his current weight of 245 pounds  There is no history suggestive of cataplexy, sleep paralysis or parasomnias  Past Medical History:  Diagnosis Date  . Allergy   . Arthritis    feet  . Coronary artery disease   . Coronary atherosclerosis of native coronary artery 12/05/2013   Prox RCA, and PLA DES 12/04/13.   . Diabetic retinopathy of both eyes ([place])    Laser surgery, Dr. Zadie Rhine  . DM  W/COMPLICATION NOS, TYPE I 08/09/2007   "dx'd ~ 1994; I say it's type II" (12/04/2013)  . Exertional shortness of breath    "last 2-3 yrs" (12/04/2013)  . GOUT 07/11/2007  . Hyperlipidemia   . HYPERTENSION 07/06/2007  . HYPOGONADISM 06/11/2010  . OSA (obstructive sleep apnea) 10/08/2009   wears CPAP "more than 1/2 the time"  . Other and unspecified angina pectoris   . RENAL INSUFFICIENCY 12/11/2008  . Serrated polyp of colon 2014  . Sleep apnea    no cpap     Past Surgical History:  Procedure Laterality Date  . CARPAL TUNNEL RELEASE Left ~ 2004  . COLONOSCOPY    . COLONOSCOPY WITH PROPOFOL N/A 11/14/2018   Procedure: COLONOSCOPY WITH PROPOFOL;  Surgeon: Rush Landmark Telford Nab., MD;  Location: [facility];  Service: Gastroenterology;  Laterality: N/A;  . CORONARY ANGIOPLASTY WITH STENT PLACEMENT  12/04/2013   "2 stents"  . EYE EXAMINATION UNDER ANESTHESIA W/ RETINAL CRYOTHERAPY AND RETINAL LASER Bilateral 2005-2010   "for my diabetes" (12/04/2013)  . HOT HEMOSTASIS N/A 11/14/2018   Procedure: HOT HEMOSTASIS (ARGON PLASMA COAGULATION/BICAP);  Surgeon: Irving Copas., MD;  Location: [facility];  Service: Gastroenterology;  Laterality: N/A;  . LEFT HEART CATHETERIZATION WITH CORONARY ANGIOGRAM Bilateral 12/04/2013   Procedure: LEFT HEART CATHETERIZATION WITH CORONARY ANGIOGRAM;  Surgeon: Candee Furbish, MD;  Location: [facility]  CATH LAB;  Service: Cardiovascular;  Laterality: Bilateral;  . POLYPECTOMY    . SUBMUCOSAL INJECTION  11/14/2018   Procedure: SUBMUCOSAL INJECTION;  Surgeon: Rush Landmark Telford Nab., MD;  Location: [facility];  Service: Gastroenterology;;    No Known Allergies  Social History   Socioeconomic History  . Marital status: Married    Spouse name: Not on file  . Number of children: Not on file  . Years of education: Not on file  . Highest education level: Not on file  Occupational History  . Occupation: Geographical information systems officer  . Financial resource strain: Not on file    . Food insecurity:    Worry: Not on file    Inability: Not on file  . Transportation needs:    Medical: Not on file    Non-medical: Not on file  Tobacco Use  . Smoking status: Former Smoker    Packs/day: 1.50    Years: 45.00    Pack years: 67.50    Types: Cigarettes    Last attempt to quit: 10/02/2010    Years since quitting: 8.2  . Smokeless tobacco: Never Used  Substance and Sexual Activity  . Alcohol use: Yes    Alcohol/week: 1.0 standard drinks    Types: 1 Shots of liquor per week  . Drug use: No  . Sexual activity: Yes  Lifestyle  . Physical activity:    Days per week: Not on file    Minutes per session: Not on file  . Stress: Not on file  Relationships  . Social connections:    Talks on phone: Not on file    Gets together: Not on file    Attends religious service: Not on file    Active member of club or organization: Not on file    Attends meetings of clubs or organizations: Not on file    Relationship status: Not on file  . Intimate partner violence:    Fear of current or ex partner: Not on file    Emotionally abused: Not on file    Physically abused: Not on file    Forced sexual activity: Not on file  Other Topics Concern  . Not on file  Social History Narrative   Regular exercise-no   Pt went to seminar to quit smoking     Family History  Problem Relation Age of Onset  . Cancer Mother   . Pancreatic cancer Mother   . Heart disease Father   . Heart attack Father   . Diabetes Other   . Hyperlipidemia Other   . Hypertension Other   . Rectal cancer Sister   . Colon cancer Neg Hx   . Esophageal cancer Neg Hx   . Stomach cancer Neg Hx   . Stroke Neg Hx   . Colon polyps Neg Hx     Review of Systems Constitutional: negative for anorexia, fevers and sweats  Eyes: negative for irritation, redness and visual disturbance  Ears, nose, mouth, throat, and face: negative for earaches, epistaxis, nasal congestion and sore throat  Respiratory: negative for  cough, dyspnea on exertion, sputum and wheezing  Cardiovascular: negative for chest pain, dyspnea, lower extremity edema, orthopnea, palpitations and syncope  Gastrointestinal: negative for abdominal pain, constipation, diarrhea, melena, nausea and vomiting  Genitourinary:negative for dysuria, frequency and hematuria  Hematologic/lymphatic: negative for bleeding, easy bruising and lymphadenopathy  Musculoskeletal:negative for arthralgias, muscle weakness and stiff joints  Neurological: negative for coordination problems, gait problems, headaches and weakness  Endocrine: negative for diabetic  symptoms including polydipsia, polyuria and weight loss     Objective:   Physical Exam  Gen. Pleasant, obese, in no distress, normal affect ENT - no pallor,icterus, no post nasal drip, class 2-3 airway Neck: No JVD, no thyromegaly, no carotid bruits Lungs: no use of accessory muscles, no dullness to percussion, decreased without rales or rhonchi  Cardiovascular: Rhythm regular, heart sounds  normal, no murmurs or gallops, 1+  peripheral edema Abdomen: soft and non-tender, no hepatosplenomegaly, BS normal. Musculoskeletal: No deformities, no cyanosis or clubbing Neuro:  alert, non focal, no tremors       Assessment & Plan:

## 2018-12-12 NOTE — Patient Instructions (Signed)
Schedule home sleep study followed by CPAP titration study Based on these results, we will set you up with a CPAP machine

## 2018-12-13 ENCOUNTER — Encounter: Payer: Self-pay | Admitting: Family Medicine

## 2018-12-13 ENCOUNTER — Ambulatory Visit (INDEPENDENT_AMBULATORY_CARE_PROVIDER_SITE_OTHER): Payer: Managed Care, Other (non HMO) | Admitting: Family Medicine

## 2018-12-13 VITALS — BP 118/62 | HR 63 | Temp 98.6°F | Ht 70.0 in | Wt 245.2 lb

## 2018-12-13 DIAGNOSIS — Z0001 Encounter for general adult medical examination with abnormal findings: Secondary | ICD-10-CM | POA: Diagnosis not present

## 2018-12-13 DIAGNOSIS — E785 Hyperlipidemia, unspecified: Secondary | ICD-10-CM

## 2018-12-13 DIAGNOSIS — Z1159 Encounter for screening for other viral diseases: Secondary | ICD-10-CM

## 2018-12-13 DIAGNOSIS — M1A9XX0 Chronic gout, unspecified, without tophus (tophi): Secondary | ICD-10-CM | POA: Diagnosis not present

## 2018-12-13 DIAGNOSIS — M109 Gout, unspecified: Secondary | ICD-10-CM | POA: Diagnosis not present

## 2018-12-13 DIAGNOSIS — E1159 Type 2 diabetes mellitus with other circulatory complications: Secondary | ICD-10-CM

## 2018-12-13 DIAGNOSIS — I2510 Atherosclerotic heart disease of native coronary artery without angina pectoris: Secondary | ICD-10-CM

## 2018-12-13 DIAGNOSIS — I10 Essential (primary) hypertension: Secondary | ICD-10-CM

## 2018-12-13 DIAGNOSIS — I152 Hypertension secondary to endocrine disorders: Secondary | ICD-10-CM

## 2018-12-13 DIAGNOSIS — E1169 Type 2 diabetes mellitus with other specified complication: Secondary | ICD-10-CM

## 2018-12-13 DIAGNOSIS — N401 Enlarged prostate with lower urinary tract symptoms: Secondary | ICD-10-CM

## 2018-12-13 DIAGNOSIS — N182 Chronic kidney disease, stage 2 (mild): Secondary | ICD-10-CM

## 2018-12-13 DIAGNOSIS — E1122 Type 2 diabetes mellitus with diabetic chronic kidney disease: Secondary | ICD-10-CM

## 2018-12-13 DIAGNOSIS — L723 Sebaceous cyst: Secondary | ICD-10-CM

## 2018-12-13 DIAGNOSIS — N183 Chronic kidney disease, stage 3 (moderate): Secondary | ICD-10-CM

## 2018-12-13 DIAGNOSIS — R351 Nocturia: Secondary | ICD-10-CM

## 2018-12-13 LAB — TSH: TSH: 4.12 u[IU]/mL (ref 0.35–4.50)

## 2018-12-13 LAB — LIPID PANEL
Cholesterol: 144 mg/dL (ref 0–200)
HDL: 35.4 mg/dL — ABNORMAL LOW (ref >39.00)
NONHDL: 108.84
Total CHOL/HDL Ratio: 4
Triglycerides: 312 mg/dL — ABNORMAL HIGH (ref 0.0–149.0)
VLDL: 62.4 mg/dL — ABNORMAL HIGH (ref 0.0–40.0)

## 2018-12-13 LAB — COMPREHENSIVE METABOLIC PANEL
ALT: 16 U/L (ref 0–53)
AST: 18 U/L (ref 0–37)
Albumin: 3.9 g/dL (ref 3.5–5.2)
Alkaline Phosphatase: 77 U/L (ref 39–117)
BUN: 22 mg/dL (ref 6–23)
CHLORIDE: 104 meq/L (ref 96–112)
CO2: 29 meq/L (ref 19–32)
Calcium: 9 mg/dL (ref 8.4–10.5)
Creatinine, Ser: 1.77 mg/dL — ABNORMAL HIGH (ref 0.40–1.50)
GFR: 38.35 mL/min — ABNORMAL LOW (ref >60.00)
Glucose, Bld: 200 mg/dL — ABNORMAL HIGH (ref 70–99)
Potassium: 4 mEq/L (ref 3.5–5.1)
Sodium: 140 mEq/L (ref 135–145)
Total Bilirubin: 0.4 mg/dL (ref 0.2–1.2)
Total Protein: 6.1 g/dL (ref 6.0–8.3)

## 2018-12-13 LAB — LDL CHOLESTEROL, DIRECT: Direct LDL: 58 mg/dL

## 2018-12-13 LAB — URIC ACID: Uric Acid, Serum: 4.5 mg/dL (ref 4.0–7.8)

## 2018-12-13 LAB — CBC
HCT: 36.4 % — ABNORMAL LOW (ref 39.0–52.0)
Hemoglobin: 12.2 g/dL — ABNORMAL LOW (ref 13.0–17.0)
MCHC: 33.5 g/dL (ref 30.0–36.0)
MCV: 90.9 fl (ref 78.0–100.0)
Platelets: 158 10*3/uL (ref 150.0–400.0)
RBC: 4.01 Mil/uL — ABNORMAL LOW (ref 4.22–5.81)
RDW: 15.5 % (ref 11.5–15.5)
WBC: 6.8 10*3/uL (ref 4.0–10.5)

## 2018-12-13 MED ORDER — ALLOPURINOL 300 MG PO TABS: 300.0000 mg | ORAL_TABLET | Freq: Every day | ORAL | 3 refills | Status: AC

## 2018-12-13 MED ORDER — DOXAZOSIN MESYLATE 8 MG PO TABS: 8.0000 mg | ORAL_TABLET | Freq: Every day | ORAL | 3 refills | Status: DC

## 2018-12-13 MED ORDER — AMLODIPINE BESYLATE 10 MG PO TABS: 10.0000 mg | ORAL_TABLET | Freq: Every day | ORAL | 3 refills | Status: AC

## 2018-12-13 MED ORDER — SIMVASTATIN 40 MG PO TABS: 40.0000 mg | ORAL_TABLET | Freq: Every day | ORAL | 3 refills | Status: DC

## 2018-12-13 NOTE — Assessment & Plan Note (Signed)
At goal.  Continue Norvasc 10 mg daily. 

## 2018-12-13 NOTE — Assessment & Plan Note (Signed)
Continue aspirin 81 mg daily and simvastatin 40 mg daily.

## 2018-12-13 NOTE — Progress Notes (Signed)
Subjective:  Bryan Haley is a 68 y.o. male who presents today for his annual comprehensive physical exam.    HPI:   He has no acute complaints today.   His stable, chronic medical conditions are outlined below:  # Cyst -Located on right breast.  Has been there for several years.  Recently started draining.  No fevers or chills.  # HTN - Currently on norvasc 10mg   and tolerating well.   # HLD - Currently on simvastatin 40 mg daily and tolerating well.  # T2DM - Follows with endocrinology - Dr Bryan Haley.  Current regimen includes acarbose 50 mg daily, bromocriptine 5 mg daily, Invokana 300 mg daily, Ozempic 1 mg weekly, and Prandin 2 mg daily.  # Gout - Currently on allopurinol 300 mg daily tolerating well.  # BPH - On cardura 8mg  daily and tolerating well.   # Arthritis - Takes 25 mg of tramadol daily as needed.  Lifestyle Diet: No specific diets Exercise: Not exercising regularly.   Depression screen PHQ 2/9 09/25/2018  Decreased Interest 0  Down, Depressed, Hopeless 0  PHQ - 2 Score 0    Health Maintenance Due  Topic Date Due  . Hepatitis C Screening  [birth year]     ROS: Per HPI, otherwise a complete review of systems was negative.   PMH:  The following were reviewed and entered/updated in epic: Past Medical History:  Diagnosis Date  . Allergy   . Arthritis    feet  . Coronary artery disease   . Coronary atherosclerosis of native coronary artery 12/05/2013   Prox RCA, and PLA DES 12/04/13.   . Diabetic retinopathy of both eyes (Bryan Haley)    Laser surgery, Dr. Zadie Haley  . 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 Exertional shortness of breath    "last 2-3 yrs" (12/04/2013)  . GOUT 07/11/2007  . Hyperlipidemia   . HYPERTENSION 07/06/2007  . HYPOGONADISM 06/11/2010  . OSA (obstructive sleep apnea) 10/08/2009   wears CPAP "more than 1/2 the time"  . Other and unspecified angina pectoris   . RENAL  INSUFFICIENCY 12/11/2008  . Serrated polyp of colon 2014  . Sleep apnea    no cpap    Patient Active Problem List   Diagnosis Date Noted  . Sebaceous cyst 12/13/2018  . Systolic murmur 19/50/9326  . BPH associated with nocturia 12/13/2017  . Diabetes ([place]) 11/04/2015  . First degree AV block 05/01/2015  . CAD (coronary artery disease) 05/15/2014  . Morbid obesity ([place]) 11/06/2013  . Former smoker 11/06/2013  . Allergic rhinitis 08/22/2012  . 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 OSA (obstructive sleep apnea) 10/08/2009  . CKD stage 3 due to type 2 diabetes mellitus ([place]) 12/11/2008  . COLONIC POLYPS, HX OF 10/02/2008  . 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 Gout 07/11/2007  . Dyslipidemia associated with type 2 diabetes mellitus ([place]) 07/06/2007  . Hypertension associated with diabetes ([place]) 07/06/2007   Past Surgical History:  Procedure Laterality Date  . CARPAL TUNNEL RELEASE Left ~ 2004  . COLONOSCOPY    . COLONOSCOPY WITH PROPOFOL N/A 11/14/2018   Procedure: COLONOSCOPY WITH PROPOFOL;  Surgeon: Bryan Haley., MD;  Location: [facility];  Service: Gastroenterology;  Laterality: N/A;  . CORONARY ANGIOPLASTY WITH STENT PLACEMENT  12/04/2013   "2 stents"  . EYE EXAMINATION UNDER ANESTHESIA W/ RETINAL CRYOTHERAPY AND RETINAL LASER Bilateral 2005-2010   "for my diabetes" (12/04/2013)  . HOT HEMOSTASIS  N/A 11/14/2018   Procedure: HOT HEMOSTASIS (ARGON PLASMA COAGULATION/BICAP);  Surgeon: Bryan Haley., MD;  Location: [facility];  Service: Gastroenterology;  Laterality: N/A;  . LEFT HEART CATHETERIZATION WITH CORONARY ANGIOGRAM Bilateral 12/04/2013   Procedure: LEFT HEART CATHETERIZATION WITH CORONARY ANGIOGRAM;  Surgeon: Bryan Furbish, MD;  Location: [facility] CATH LAB;  Service: Cardiovascular;  Laterality: Bilateral;  . POLYPECTOMY    . SUBMUCOSAL INJECTION  11/14/2018   Procedure: SUBMUCOSAL INJECTION;  Surgeon: Bryan Haley., MD;  Location: [facility];  Service:  Gastroenterology;;   Family History  Problem Relation Age of Onset  . Cancer Mother   . Pancreatic cancer Mother   . Heart disease Father   . Heart attack Father   . Diabetes Other   . Hyperlipidemia Other   . Hypertension Other   . Rectal cancer Sister   . Colon cancer Neg Hx   . Esophageal cancer Neg Hx   . Stomach cancer Neg Hx   . Stroke Neg Hx   . Colon polyps Neg Hx     Medications- reviewed and updated Current Outpatient Medications  Medication Sig Dispense Refill  . acarbose (PRECOSE) 50 MG tablet Take 1 tablet (50 mg total) by mouth 3 (three) times daily with meals. 270 tablet 3  . allopurinol (ZYLOPRIM) 300 MG tablet Take 1 tablet (300 mg total) by mouth daily. 90 tablet 3  . amLODipine (NORVASC) 10 MG tablet Take 1 tablet (10 mg total) by mouth daily. 90 tablet 3  . aspirin 81 MG chewable tablet Chew 1 tablet (81 mg total) by mouth daily. Resume Tuesday 11/20/18    . bromocriptine (PARLODEL) 5 MG capsule TAKE 1 CAPSULE BY MOUTH EVERY DAY 90 capsule 1  . canagliflozin (INVOKANA) 300 MG TABS tablet Take 1 tablet (300 mg total) by mouth daily before breakfast. Hold until Monday 11/19/18 30 tablet 11  . doxazosin (CARDURA) 8 MG tablet Take 1 tablet (8 mg total) by mouth at bedtime. 90 tablet 3  . fluticasone (FLONASE) 50 MCG/ACT nasal spray PLACE 1 SPRAY INTO BOTH NOSTRILS DAILY 32 g 10  . glucose blood (ONE TOUCH ULTRA TEST) test strip USE TO TEST BLOOD USGAR TWICE A DAY 100 each 3  . OZEMPIC 1 MG/DOSE SOPN INJECT 1 MG INTO THE SKIN ONCE A WEEK. 12 pen 3  . repaglinide (PRANDIN) 2 MG tablet TAKE 2 TABLETS BY MOUTH THREE TIMES DAILY BEFORE MEAL(S) 540 tablet 0  . simvastatin (ZOCOR) 40 MG tablet Take 1 tablet (40 mg total) by mouth at bedtime. 90 tablet 3  . traMADol (ULTRAM) 50 MG tablet TAKE 1/2 TAB BY MOUTH TWICE A DAY 100 tablet 1   No current facility-administered medications for this visit.     Allergies-reviewed and updated No Known Allergies  Social History    Socioeconomic History  . Marital status: Married    Spouse name: Not on file  . Number of children: Not on file  . Years of education: Not on file  . Highest education level: Not on file  Occupational History  . Occupation: Geographical information systems officer  . Financial resource strain: Not on file  . Food insecurity:    Worry: Not on file    Inability: Not on file  . Transportation needs:    Medical: Not on file    Non-medical: Not on file  Tobacco Use  . Smoking status: Former Smoker    Packs/day: 1.50    Years: 45.00    Pack years: 67.50  Types: Cigarettes    Last attempt to quit: 10/02/2010    Years since quitting: 8.2  . Smokeless tobacco: Never Used  Substance and Sexual Activity  . Alcohol use: Yes    Alcohol/week: 1.0 standard drinks    Types: 1 Shots of liquor per week  . Drug use: No  . Sexual activity: Yes  Lifestyle  . Physical activity:    Days per week: Not on file    Minutes per session: Not on file  . Stress: Not on file  Relationships  . Social connections:    Talks on phone: Not on file    Gets together: Not on file    Attends religious service: Not on file    Active member of club or organization: Not on file    Attends meetings of clubs or organizations: Not on file    Relationship status: Not on file  Other Topics Concern  . Not on file  Social History Narrative   Regular exercise-no   Pt went to seminar to quit smoking    Objective:  Physical Exam: BP 118/62 (BP Location: Left Arm, Patient Position: Sitting, Cuff Size: Large)   Pulse 63   Temp 98.6 F (37 C) (Oral)   Ht 5\' 10"  (1.778 m)   Wt 245 lb 4 oz (111.2 kg)   SpO2 96%   BMI 35.19 kg/m   Body mass index is 35.19 kg/m. Wt Readings from Last 3 Encounters:  12/13/18 245 lb 4 oz (111.2 kg)  12/12/18 245 lb (111.1 kg)  11/26/18 248 lb 3.2 oz (112.6 kg)   Gen: NAD, resting comfortably HEENT: TMs normal bilaterally. OP clear. No thyromegaly noted.  CV: RRR with no murmurs  appreciated Pulm: NWOB, CTAB with no crackles, wheezes, or rhonchi GI: Normal bowel sounds present. Soft, Nontender, Nondistended. MSK: no edema, cyanosis, or clubbing noted Skin: warm, dry.  Approximately 0.5 cm nodular lesion on right outer quadrant of right breast.  Freely mobile. Neuro: CN2-12 grossly intact. Strength 5/5 in upper and lower extremities. Reflexes symmetric and intact bilaterally.  Psych: Normal affect and thought content  Assessment/Plan:  Sebaceous cyst No red flags.  Reassured patient.  Continue with watchful waiting.  Diabetes ([place]) Continue current meds per endocrinology.  Morbid obesity Weight down about 3 pounds over the last month.  Continue lifestyle modifications.  Hypertension associated with diabetes ([place]) At goal.  Continue Norvasc 10 mg daily.  Gout Check uric acid level.  Dyslipidemia associated with type 2 diabetes mellitus ([facility]) Continue simvastatin 40 mg daily.  Check lipid panel.  CAD (coronary artery disease) Continue aspirin 81 mg daily and simvastatin 40 mg daily.  BPH associated with nocturia Stable.  Continue Cardura 8 mg daily.  CKD stage 3 due to type 2 diabetes mellitus ([facility]) Check metabolic panel.  Preventative Healthcare: Check hep C.  Patient Counseling(The following topics were reviewed and/or handout was given):  -Nutrition: Stressed importance of moderation in sodium/caffeine intake, saturated fat and cholesterol, caloric balance, sufficient intake of fresh fruits, vegetables, and fiber.  -Stressed the importance of regular exercise.   -Substance Abuse: Discussed cessation/primary prevention of tobacco, alcohol, or other drug use; driving or other dangerous activities under the influence; availability of treatment for abuse.   -Injury prevention: Discussed safety belts, safety helmets, smoke detector, smoking near bedding or upholstery.   -Sexuality: Discussed sexually transmitted diseases, partner selection, use of  condoms, avoidance of unintended pregnancy and contraceptive alternatives.   -Dental health: Discussed importance of regular tooth brushing,  flossing, and dental visits.  -Health maintenance and immunizations reviewed. Please refer to Health maintenance section.  Return to care in 1 year for next preventative visit.   Algis Greenhouse. Jerline Pain, MD 12/13/2018 10:27 AM

## 2018-12-13 NOTE — Assessment & Plan Note (Signed)
Weight down about 3 pounds over the last month.  Continue lifestyle modifications.

## 2018-12-13 NOTE — Assessment & Plan Note (Signed)
Stable.  Continue Cardura 8 mg daily.

## 2018-12-13 NOTE — Assessment & Plan Note (Signed)
Continue simvastatin 40 mg daily.  Check lipid panel.

## 2018-12-13 NOTE — Patient Instructions (Signed)
It was very nice to see you today!  A lot on your breast is a cyst.  This may occasionally drain.  Please let me know if it becomes painful or changes in any other way.  We will not make any medication changes today.  We will check blood work today.  Come back to see me in 1 year or sooner.  Take care, Dr Jerline Pain

## 2018-12-13 NOTE — Assessment & Plan Note (Signed)
Continue current meds per endocrinology.

## 2018-12-13 NOTE — Assessment & Plan Note (Addendum)
Check metabolic panel.  

## 2018-12-13 NOTE — Assessment & Plan Note (Signed)
No red flags.  Reassured patient.  Continue with watchful waiting. 

## 2018-12-13 NOTE — Assessment & Plan Note (Signed)
Check uric acid level 

## 2018-12-14 LAB — HEPATITIS C ANTIBODY
HEP C AB: NONREACTIVE
SIGNAL TO CUT-OFF: 0.02 (ref <1.00)

## 2018-12-17 NOTE — Progress Notes (Signed)
Please inform patient of the following:  Blood counts are stable.  Electrolytes, kidney function, liver function, and blood sugar levels are stable. Cholesterol levels are normal. Uric acid level is normal. Hep C test is NEGATIVE.   Overall, his labs are stable. Do not need to make any changes at this time. We can recheck again in a year.   Algis Greenhouse. Jerline Pain, MD 12/17/2018 5:31 PM

## 2018-12-19 ENCOUNTER — Ambulatory Visit: Payer: Managed Care, Other (non HMO) | Admitting: Gastroenterology

## 2018-12-19 ENCOUNTER — Telehealth: Payer: Self-pay

## 2018-12-19 DIAGNOSIS — K922 Gastrointestinal hemorrhage, unspecified: Secondary | ICD-10-CM

## 2018-12-19 NOTE — Telephone Encounter (Signed)
Left a message for patient to return my call. 

## 2018-12-19 NOTE — Telephone Encounter (Signed)
-----   Message from Ladene Artist, MD sent at 12/19/2018 11:12 AM EST ----- Post polypectomy bleed patient we discussed today.  An office appt with me on 12/25/2018 had already been scheduled. Estill Bamberg, please contact the patient to come the lab for a CBC this week.

## 2018-12-20 ENCOUNTER — Telehealth: Payer: Self-pay | Admitting: Family Medicine

## 2018-12-20 NOTE — Telephone Encounter (Signed)
Pt given lab results per notes of Dr. Jerline Pain on 12/17/18. Pt verbalized understanding. Pt states he did not speak to anyone regarding results today and only received a voicemail message. Unable to document in result note.

## 2018-12-20 NOTE — Telephone Encounter (Signed)
Left a message for patient to return my call. 

## 2018-12-20 NOTE — Telephone Encounter (Signed)
Patient returning call for lab results. Patient disconnected call while attempting to reach NT. Please contact patient.     Copied from [place]. Topic: Quick Communication - Lab Results (Clinic Use ONLY) >> Dec 18, 2018  8:50 AM Loralyn Freshwater, CMA wrote: Called patient to inform them of  lab results. When patient returns call, triage nurse may disclose results.

## 2018-12-20 NOTE — Telephone Encounter (Signed)
Attempted to contact pt to give him lab results per Dr Dimas Chyle, [facility]; left message on voicemail [phone]; results available in result note.

## 2018-12-21 ENCOUNTER — Other Ambulatory Visit (INDEPENDENT_AMBULATORY_CARE_PROVIDER_SITE_OTHER): Payer: Managed Care, Other (non HMO)

## 2018-12-21 DIAGNOSIS — K922 Gastrointestinal hemorrhage, unspecified: Secondary | ICD-10-CM

## 2018-12-21 LAB — CBC WITH DIFFERENTIAL/PLATELET
Basophils Absolute: 0.1 10*3/uL (ref 0.0–0.1)
Basophils Relative: 0.9 % (ref 0.0–3.0)
EOS ABS: 0.2 10*3/uL (ref 0.0–0.7)
Eosinophils Relative: 1.8 % (ref 0.0–5.0)
HCT: 36.2 % — ABNORMAL LOW (ref 39.0–52.0)
HEMOGLOBIN: 12.6 g/dL — AB (ref 13.0–17.0)
LYMPHS PCT: 18 % (ref 12.0–46.0)
Lymphs Abs: 1.6 10*3/uL (ref 0.7–4.0)
MCHC: 34.8 g/dL (ref 30.0–36.0)
MCV: 90 fl (ref 78.0–100.0)
Monocytes Absolute: 0.5 10*3/uL (ref 0.1–1.0)
Monocytes Relative: 5.8 % (ref 3.0–12.0)
Neutro Abs: 6.6 10*3/uL (ref 1.4–7.7)
Neutrophils Relative %: 73.5 % (ref 43.0–77.0)
PLATELETS: 172 10*3/uL (ref 150.0–400.0)
RBC: 4.03 Mil/uL — ABNORMAL LOW (ref 4.22–5.81)
RDW: 15.6 % — AB (ref 11.5–15.5)
WBC: 9 10*3/uL (ref 4.0–10.5)

## 2018-12-22 ENCOUNTER — Other Ambulatory Visit: Payer: Self-pay | Admitting: Endocrinology

## 2018-12-24 ENCOUNTER — Telehealth: Payer: Self-pay | Admitting: Gastroenterology

## 2018-12-24 NOTE — Telephone Encounter (Signed)
See lab results for details.  

## 2018-12-24 NOTE — Telephone Encounter (Signed)
PT called back for test results. JG

## 2018-12-25 ENCOUNTER — Ambulatory Visit: Payer: Managed Care, Other (non HMO) | Admitting: Gastroenterology

## 2018-12-25 ENCOUNTER — Encounter: Payer: Self-pay | Admitting: Gastroenterology

## 2018-12-25 VITALS — BP 158/70 | HR 72 | Ht 70.0 in | Wt 254.4 lb

## 2018-12-25 DIAGNOSIS — Z86010 Personal history of colonic polyps: Secondary | ICD-10-CM | POA: Diagnosis not present

## 2018-12-25 DIAGNOSIS — D62 Acute posthemorrhagic anemia: Secondary | ICD-10-CM

## 2018-12-25 NOTE — Patient Instructions (Signed)
You will be due for a recall colonoscopy in 04/2019. We will send you a reminder in the mail when it gets closer to that time.  Thank you for choosing me and [facility].  Pricilla Riffle. Dagoberto Ligas., MD., [facility]

## 2018-12-25 NOTE — Progress Notes (Signed)
    History of Present Illness: This is a 68 year old male hospitalized for a post polypectomy bleed treated with clips, injection and bipolar cautery as outlined below.  16 polyps were removed by hot snare including one piecemeal from the cecum.  11 polyps removed by cold snare.  6 small transverse colon polyps not removed.  All polyps removed were tubular adenomas.  He received 4 units of packed red blood cells with Hgb drop to 7.1. One month after discharge his Hgb has increased to 12.6.  No GI complaints since discharge. He feels well. He relates he is planning to move to [facility] later this year and wants to keep his GI care with me as he will return frequently to see children, grandchildren in [place].   Colonoscopy 11/15/2018 - Inadequate bowel preparation - Maroon Blood hemorrhage found on digital rectal exam. - There was significant looping of the colon. - Blood in the entire examined colon. - A single (solitary) ulcer in the cecum from large EMR. Clips (MR conditional) were placed. - Two ulcers in the cecum. Clips (MR conditional) were placed. - A single (solitary) ulcer in the ascending colon. Injected. Treated with bipolar cautery. Clips (MR conditional) were placed. - A single (solitary) ulcer in the transverse colon. Injected. Treated with bipolar cautery. Clips (MR conditional) were placed. - Multiple ulcers in the descending colon, in the transverse colon, at the hepatic flexure and in the ascending colon. Clips (MR conditional) were placed. - Multiple polyps in the descending colon, in the transverse colon, at the hepatic flexure and in the ascending colon. Resection not attempted.  Colonoscopy 11/13/2018 - One 25 mm polyp in the cecum, removed piecemeal using a hot snare. Resected and retrieved. - Fifteen 10 to 20 mm polyps in the transverse colon, in the ascending colon and in the cecum, removed with a hot snare. Resected and retrieved. - Eleven 6 to 9 mm polyps in the descending  colon, in the transverse colon and in the ascending colon, removed with a cold snare. Resected and retrieved. - Six 5 to 7 mm polyps in the transverse colon. Not removed. - Diverticulosis in the left colon. - Internal hemorrhoids. - The examination was otherwise normal on direct and retroflexion views.  Current Medications, Allergies, Past Medical History, Past Surgical History, Family History and Social History were reviewed in [employer] record.  Physical Exam: General: Well developed, well nourished, no acute distress Head: Normocephalic and atraumatic Eyes:  sclerae anicteric, EOMI Ears: Normal auditory acuity Mouth: No deformity or lesions Lungs: Clear throughout to auscultation Heart: Regular rate and rhythm; no murmurs, rubs or bruits Abdomen: Soft, non tender and non distended. No masses, hepatosplenomegaly or hernias noted. Normal Bowel sounds Rectal: Not done Musculoskeletal: Symmetrical with no gross deformities  Pulses:  Normal pulses noted Extremities: No clubbing, cyanosis, edema or deformities noted Neurological: Alert oriented x 4, grossly nonfocal Psychological:  Alert and cooperative. Normal mood and affect   Assessment and Recommendations:  1.  Post polypectomy bleed resolved.  2.  ABL anemia, resolving.  3.  Numerous tubular adenomas.  Colonoscopy for surveillance of the piecemeal polypectomy site and removal of several polyps not removed at the first colonoscopy is recommended in June 2020. We discussed genetic testing and he will consider. He did not tolerate moderate sedation. Will exclusively use MAC in future.

## 2019-01-07 ENCOUNTER — Other Ambulatory Visit: Payer: Self-pay | Admitting: Endocrinology

## 2019-01-10 DIAGNOSIS — G4733 Obstructive sleep apnea (adult) (pediatric): Secondary | ICD-10-CM | POA: Diagnosis not present

## 2019-01-11 ENCOUNTER — Telehealth: Payer: Self-pay | Admitting: Pulmonary Disease

## 2019-01-11 DIAGNOSIS — G4733 Obstructive sleep apnea (adult) (pediatric): Secondary | ICD-10-CM

## 2019-01-11 NOTE — Telephone Encounter (Signed)
HST showed severe OSA 55/hour Given severity and issues with prior mass tolerance, proceed with CPAP titration study

## 2019-01-14 ENCOUNTER — Other Ambulatory Visit: Payer: Self-pay | Admitting: Endocrinology

## 2019-01-23 NOTE — Telephone Encounter (Signed)
Yes

## 2019-01-23 NOTE — Telephone Encounter (Signed)
Call made to patient, he states he was requesting that it be done before March 31st as he is moving to [facility] and retiring there. Order placed. Requested to be done before March 15th at patient request. Nothing further is needed at this time.

## 2019-01-23 NOTE — Telephone Encounter (Signed)
Spoke with patient. He is aware of results. He stated that he will proceed with the CPAP titration study, but he wanted to make RA know that he will be leaving the state on March 31st.   Do you still want him to proceed with the titration study?

## 2019-01-28 ENCOUNTER — Ambulatory Visit (HOSPITAL_BASED_OUTPATIENT_CLINIC_OR_DEPARTMENT_OTHER): Payer: Managed Care, Other (non HMO) | Attending: Pulmonary Disease | Admitting: Pulmonary Disease

## 2019-01-28 VITALS — Ht 70.0 in | Wt 245.0 lb

## 2019-01-28 DIAGNOSIS — G4733 Obstructive sleep apnea (adult) (pediatric): Secondary | ICD-10-CM | POA: Diagnosis present

## 2019-02-01 ENCOUNTER — Telehealth: Payer: Self-pay | Admitting: Pulmonary Disease

## 2019-02-01 DIAGNOSIS — G4733 Obstructive sleep apnea (adult) (pediatric): Secondary | ICD-10-CM

## 2019-02-01 NOTE — Procedures (Signed)
Patient Name: Kimari, Coudriet Date: 01/28/2019 Gender: Male D.O.B: [birth year] Age (years): 70 Referring Provider: Kara Mead MD, ABSM Height (inches): 70 Interpreting Physician: Kara Mead MD, ABSM Weight (lbs): 245 RPSGT: Rebekah Chesterfield BMI: 35 MRN: [id number] Neck Size: 17.50 <br> <br> CLINICAL INFORMATION The patient is referred for a PAP titration to treat sleep apnea.    Date of HST:  12/2018 severe OSA 55/hour  SLEEP STUDY TECHNIQUE As per the AASM Manual for the Scoring of Sleep and Associated Events v2.3 (April 2016) with a hypopnea requiring 4% desaturations.  The channels recorded and monitored were frontal, central and occipital EEG, electrooculogram (EOG), submentalis EMG (chin), nasal and oral airflow, thoracic and abdominal wall motion, anterior tibialis EMG, snore microphone, electrocardiogram, and pulse oximetry. Bilevel positive airway pressure (BPAP) was initiated at the beginning of the study and titrated to treat sleep-disordered breathing.  MEDICATIONS Medications self-administered by patient taken the night of the study : N/A  RESPIRATORY PARAMETERS Optimal IPAP Pressure (cm): 30 AHI at Optimal Pressure (/hr) 24.7 Optimal EPAP Pressure (cm): 25   Overall Minimal O2 (%): 64.0 Minimal O2 at Optimal Pressure (%): 83.0   SLEEP ARCHITECTURE Start Time: 10:13:13 PM Stop Time: 4:41:51 AM Total Time (min): 388.6 Total Sleep Time (min): 321.5 Sleep Latency (min): 2.5 Sleep Efficiency (%): 82.7% REM Latency (min): 41.5 WASO (min): 64.6 Stage N1 (%): 32.3% Stage N2 (%): 56.8% Stage N3 (%): 0.0% Stage R (%): 10.9 Supine (%): 98.91 Arousal Index (/hr): 37.9     CARDIAC DATA The 2 lead EKG demonstrated atrial fibrillation. The mean heart rate was 57.6 beats per minute. Other EKG findings include: None.   LEG MOVEMENT DATA The total Periodic Limb Movements of Sleep (PLMS) were 0. The PLMS index was 0.0. A PLMS index of <15 is considered normal in  adults.  IMPRESSIONS - An optimal PAP pressure was selected for this patient ( 30 /25 cm of water) - Mild Central Sleep Apnea was noted during this titration (CAI = 11.8/h). - Severe oxygen desaturations were observed during this titration (min O2 = 64.0%). - The patient snored with moderate snoring volume. - No cardiac abnormalities were observed during this study. - Clinically significant periodic limb movements were not noted during this study. Arousals associated with PLMs were rare.   DIAGNOSIS - Obstructive Sleep Apnea (327.23 [G47.33 ICD-10])   RECOMMENDATIONS - Trial of BiPAP therapy on 30/25 cm H2O with a Large size Resmed Full Face Mask AirFit F20 mask and heated humidification. Due to high pressures , would suggest autoBiPAP, EPAP min 12cm, pS +5 cm, IPAP max 30 cm - Avoid alcohol, sedatives and other CNS depressants that may worsen sleep apnea and disrupt normal sleep architecture. - Sleep hygiene should be reviewed to assess factors that may improve sleep quality. - Weight management and regular exercise should be initiated or continued. - [facility] to [facility] for re-evaluation after 4 weeks of therapy   Kara Mead MD Board Certified in [place]

## 2019-02-01 NOTE — Telephone Encounter (Signed)
Due to high pressures , would suggest autoBiPAP, EPAP min 12cm, pS +5 cm, IPAP max 30 cm - Large size Resmed Full Face Mask AirFit F20 mask and heated humidification.  OV in 6 wks

## 2019-02-05 ENCOUNTER — Encounter: Payer: Self-pay | Admitting: Endocrinology

## 2019-02-05 ENCOUNTER — Ambulatory Visit (INDEPENDENT_AMBULATORY_CARE_PROVIDER_SITE_OTHER): Payer: Managed Care, Other (non HMO) | Admitting: Endocrinology

## 2019-02-05 VITALS — BP 130/70 | HR 53 | Ht 70.0 in | Wt 251.2 lb

## 2019-02-05 DIAGNOSIS — N182 Chronic kidney disease, stage 2 (mild): Secondary | ICD-10-CM | POA: Diagnosis not present

## 2019-02-05 DIAGNOSIS — E1122 Type 2 diabetes mellitus with diabetic chronic kidney disease: Secondary | ICD-10-CM | POA: Diagnosis not present

## 2019-02-05 LAB — POCT GLYCOSYLATED HEMOGLOBIN (HGB A1C): Hemoglobin A1C: 7.4 % — AB (ref 4.0–5.6)

## 2019-02-05 MED ORDER — COLESEVELAM HCL 625 MG PO TABS: 1250.0000 mg | ORAL_TABLET | Freq: Every day | ORAL | 3 refills | Status: AC

## 2019-02-05 MED ORDER — CANAGLIFLOZIN 300 MG PO TABS: 300.0000 mg | ORAL_TABLET | Freq: Every day | ORAL | 3 refills | Status: AC

## 2019-02-05 NOTE — Progress Notes (Signed)
Subjective:    Patient ID: Bryan Haley, male    DOB: [birth year], 68 y.o.   MRN: [id number]  HPI Pt returns for f/u of diabetes mellitus: DM type: 2 Dx'ed: 2001.  Complications: renal insufficiency and CAD.  Therapy: ozempic and 4 oral meds.   DKA: never.  Severe hypoglycemia: never.   Pancreatitis: never.   Other: insurance declines weight-loss surgery; he took insulin 2009-2016; oral rx options are limited by renal insuff and edema.   Interval history:  no cbg record, but states cbg's vary from 180-220.  pt states he feels well in general.   Past Medical History:  Diagnosis Date  . Allergy   . Arthritis    feet  . Coronary artery disease   . Coronary atherosclerosis of native coronary artery 12/05/2013   Prox RCA, and PLA DES 12/04/13.   . Diabetic retinopathy of both eyes ([place])    Laser surgery, Dr. Zadie Rhine  . 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 Exertional shortness of breath    "last 2-3 yrs" (12/04/2013)  . GOUT 07/11/2007  . Hyperlipidemia   . HYPERTENSION 07/06/2007  . HYPOGONADISM 06/11/2010  . OSA (obstructive sleep apnea) 10/08/2009   wears CPAP "more than 1/2 the time"  . Other and unspecified angina pectoris   . RENAL INSUFFICIENCY 12/11/2008  . Serrated polyp of colon 2014  . Sleep apnea    no cpap     Past Surgical History:  Procedure Laterality Date  . CARPAL TUNNEL RELEASE Left ~ 2004  . COLONOSCOPY    . COLONOSCOPY WITH PROPOFOL N/A 11/14/2018   Procedure: COLONOSCOPY WITH PROPOFOL;  Surgeon: Rush Landmark Telford Nab., MD;  Location: [facility];  Service: Gastroenterology;  Laterality: N/A;  . CORONARY ANGIOPLASTY WITH STENT PLACEMENT  12/04/2013   "2 stents"  . EYE EXAMINATION UNDER ANESTHESIA W/ RETINAL CRYOTHERAPY AND RETINAL LASER Bilateral 2005-2010   "for my diabetes" (12/04/2013)  . HOT HEMOSTASIS N/A 11/14/2018   Procedure: HOT HEMOSTASIS (ARGON PLASMA COAGULATION/BICAP);  Surgeon: Irving Copas., MD;  Location: [facility];  Service: Gastroenterology;  Laterality: N/A;  . LEFT HEART CATHETERIZATION WITH CORONARY ANGIOGRAM Bilateral 12/04/2013   Procedure: LEFT HEART CATHETERIZATION WITH CORONARY ANGIOGRAM;  Surgeon: Candee Furbish, MD;  Location[facility] C[facility] LAB;  Service: Cardiovascular;  Laterality: Bilateral;  . POLYPECTOMY    . SUBMUCOSAL INJECTION  11/14/2018   Procedure: SUBMUCOSAL INJECTION;  Surgeon: Rush Landmark Telford Nab., MD;  Location: [facility];  Service: Gastroenterology;;    Social History   Socioeconomic History  . Marital status: Married    Spouse name: Not on file  . Number of children: Not on file  . Years of education: Not on file  . Highest education level: Not on file  Occupational History  . Occupation: Geographical information systems officer  . Financial resource strain: Not on file  . Food insecurity:    Worry: Not on file    Inability: Not on file  . Transportation needs:    Medical: Not on file    Non-medical: Not on file  Tobacco Use  . Smoking status: Former Smoker    Packs/day: 1.50    Years: 45.00    Pack years: 67.50    Types: Cigarettes    Last attempt to quit: 10/02/2010    Years since quitting: 8.3  . Smokeless tobacco: Never Used  Substance and Sexual Activity  . Alcohol use: Yes  Alcohol/week: 1.0 standard drinks    Types: 1 Shots of liquor per week  . Drug use: No  . Sexual activity: Yes  Lifestyle  . Physical activity:    Days per week: Not on file    Minutes per session: Not on file  . Stress: Not on file  Relationships  . Social connections:    Talks on phone: Not on file    Gets together: Not on file    Attends religious service: Not on file    Active member of club or organization: Not on file    Attends meetings of clubs or organizations: Not on file    Relationship status: Not on file  . Intimate partner violence:    Fear of current or ex partner: Not on file    Emotionally abused: Not on file    Physically abused: Not  on file    Forced sexual activity: Not on file  Other Topics Concern  . Not on file  Social History Narrative   Regular exercise-no   Pt went to seminar to quit smoking    Current Outpatient Medications on File Prior to Visit  Medication Sig Dispense Refill  . acarbose (PRECOSE) 50 MG tablet Take 1 tablet (50 mg total) by mouth 3 (three) times daily with meals. 270 tablet 3  . allopurinol (ZYLOPRIM) 300 MG tablet Take 1 tablet (300 mg total) by mouth daily. 90 tablet 3  . amLODipine (NORVASC) 10 MG tablet Take 1 tablet (10 mg total) by mouth daily. 90 tablet 3  . aspirin 81 MG chewable tablet Chew 1 tablet (81 mg total) by mouth daily. Resume Tuesday 11/20/18    . bromocriptine (PARLODEL) 5 MG capsule TAKE 1 CAPSULE BY MOUTH ONCE DAILY 90 capsule 0  . doxazosin (CARDURA) 8 MG tablet Take 1 tablet (8 mg total) by mouth at bedtime. 90 tablet 3  . fluticasone (FLONASE) 50 MCG/ACT nasal spray PLACE 1 SPRAY INTO BOTH NOSTRILS DAILY 32 g 10  . glucose blood (ONE TOUCH ULTRA TEST) test strip USE TO TEST BLOOD USGAR TWICE A DAY 100 each 3  . OZEMPIC, 1 MG/DOSE, 2 MG/1.5ML SOPN INJECT 1MG  INTO THE SKIN ONCE A WEEK 4 mL 0  . repaglinide (PRANDIN) 2 MG tablet TAKE 2 TABLETS BY MOUTH THREE TIMES DAILY BEFORE MEAL(S) 540 tablet 0  . simvastatin (ZOCOR) 40 MG tablet Take 1 tablet (40 mg total) by mouth at bedtime. 90 tablet 3  . traMADol (ULTRAM) 50 MG tablet TAKE 1/2 TAB BY MOUTH TWICE A DAY 100 tablet 1   No current facility-administered medications on file prior to visit.     No Known Allergies  Family History  Problem Relation Age of Onset  . Cancer Mother   . Pancreatic cancer Mother   . Heart disease Father   . Heart attack Father   . Diabetes Other   . Hyperlipidemia Other   . Hypertension Other   . Rectal cancer Sister   . Colon cancer Neg Hx   . Esophageal cancer Neg Hx   . Stomach cancer Neg Hx   . Stroke Neg Hx   . Colon polyps Neg Hx     BP 130/70 (BP Location: Left Arm,  Patient Position: Sitting, Cuff Size: Large)   Pulse (!) 53   Ht 5\' 10"  (1.778 m)   Wt 251 lb 3.2 oz (113.9 kg)   SpO2 92%   BMI 36.04 kg/m    Review of Systems He denies hypoglycemia.  Objective:   Physical Exam VITAL SIGNS:  See vs page GENERAL: no distress Pulses: dorsalis pedis intact bilat.   MSK: no deformity of the feet CV: 1+ bilat leg edema Skin:  no ulcer on the feet.  normal color and temp on the feet. Neuro: sensation is intact to touch on the feet  Lab Results  Component Value Date   HGBA1C 7.4 (A) 02/05/2019   Lab Results  Component Value Date   CREATININE 1.77 (H) 12/13/2018   BUN 22 12/13/2018   NA 140 12/13/2018   K 4.0 12/13/2018   CL 104 12/13/2018   CO2 29 12/13/2018       Assessment & Plan:  Type 2 DM, with CAD: worse Renal insuff: this limits rx options.  Edema: This also limits rx options   Patient Instructions  I have sent a prescription to your pharmacy, to add "Welchol." Please continue the same other diabetes medications.    check your blood sugar once a day.  vary the time of day when you check, between before the 3 meals, and at bedtime.  also check if you have symptoms of your blood sugar being too high or too low.  please keep a record of the readings and bring it to your next appointment here (or you can bring the meter itself).  You can write it on any piece of paper.  please call us sooner if your blood sugar goes below 70, or if you have a lot of readings over 200.   Please come back for a follow-up appointment in 3 months.

## 2019-02-05 NOTE — Patient Instructions (Addendum)
I have sent a prescription to your pharmacy, to add "Welchol." Please continue the same other diabetes medications.    check your blood sugar once a day.  vary the time of day when you check, between before the 3 meals, and at bedtime.  also check if you have symptoms of your blood sugar being too high or too low.  please keep a record of the readings and bring it to your next appointment here (or you can bring the meter itself).  You can write it on any piece of paper.  please call us sooner if your blood sugar goes below 70, or if you have a lot of readings over 200.   Please come back for a follow-up appointment in 3 months.

## 2019-02-08 NOTE — Telephone Encounter (Signed)
Call made to patient, made aware of RA recommendations. Voiced understanding. Order placed. Declined to make appt stating he would be moving to [facility] in 3 weeks. Nothing further is needed at this time.

## 2019-02-12 ENCOUNTER — Telehealth: Payer: Self-pay | Admitting: Pulmonary Disease

## 2019-02-12 ENCOUNTER — Ambulatory Visit: Payer: Managed Care, Other (non HMO) | Admitting: Endocrinology

## 2019-02-12 ENCOUNTER — Encounter: Payer: Self-pay | Admitting: Family Medicine

## 2019-02-12 DIAGNOSIS — G4733 Obstructive sleep apnea (adult) (pediatric): Secondary | ICD-10-CM

## 2019-02-12 NOTE — Telephone Encounter (Signed)
Okay to change IPAP max to 25

## 2019-02-12 NOTE — Telephone Encounter (Signed)
Called and spoke with Baxter Flattery, she stated that the patient is on a BIpap machine and the epap pressure is at 12 and the ipap pressure is at 30. The standard only goes up to 25.  RA please advise, thank you.

## 2019-02-13 ENCOUNTER — Other Ambulatory Visit: Payer: Self-pay | Admitting: Endocrinology

## 2019-02-13 ENCOUNTER — Other Ambulatory Visit: Payer: Self-pay

## 2019-02-13 MED ORDER — VARENICLINE TARTRATE 1 MG PO TABS: 1.0000 mg | ORAL_TABLET | Freq: Every day | ORAL | 0 refills | Status: AC

## 2019-02-13 NOTE — Telephone Encounter (Signed)
New order placed to reflect IPAP max to 25 rather than 30  I called and left detailed msg letting Baxter Flattery know  Will close encounter

## 2019-02-15 ENCOUNTER — Encounter: Payer: Self-pay | Admitting: Endocrinology

## 2019-02-15 NOTE — Telephone Encounter (Signed)
Please advise if change to appt is appropriate

## 2019-02-19 ENCOUNTER — Other Ambulatory Visit: Payer: Self-pay | Admitting: Endocrinology

## 2019-02-22 ENCOUNTER — Telehealth: Payer: Self-pay | Admitting: Pulmonary Disease

## 2019-02-22 DIAGNOSIS — G4731 Primary central sleep apnea: Secondary | ICD-10-CM

## 2019-02-22 NOTE — Telephone Encounter (Addendum)
LMTCB x1 for Katie with [facility].

## 2019-02-25 NOTE — Telephone Encounter (Signed)
LMTCB x2 for Katie with [facility].  

## 2019-02-26 NOTE — Telephone Encounter (Signed)
LVM for Joellen [place] with [facility] at [phone] to return call regarding rx needed for pt. X3

## 2019-02-27 NOTE — Telephone Encounter (Signed)
Attempted to call Katie with [facility] but unable to reach her. Left message for Joellen Jersey to return call.

## 2019-02-27 NOTE — Telephone Encounter (Signed)
Bryan Haley is returning call. Cb is [phone]

## 2019-02-28 NOTE — Telephone Encounter (Signed)
LMTCB x2 for Katie with [facility].

## 2019-02-28 NOTE — Telephone Encounter (Signed)
lmom 

## 2019-02-28 NOTE — Telephone Encounter (Signed)
Katie with [facility] is calling back [phone]

## 2019-03-01 NOTE — Telephone Encounter (Signed)
lmom 

## 2019-03-01 NOTE — Telephone Encounter (Signed)
Order has been placed. Nothing further needed. 

## 2019-03-01 NOTE — Telephone Encounter (Signed)
Left message for Bryan Haley to call back. Need to see exactly why an additional code is needed. Never heard of this before.

## 2019-03-01 NOTE — Telephone Encounter (Signed)
Katie returning phone call.  Katie phone number is [phone].

## 2019-03-01 NOTE — Telephone Encounter (Signed)
Spoke with [employer]. She stated that in order for the patient to be able to get his bipap, he would need to have a diagnosis of central apneas. She reviewed his HST and did note that he did have some central apneas. She stated that the order could resubmitted electronically with the extra diagnosis code.   Tonya, since RA is not here, are you ok with me signing the order under your name? Please advise. Thanks!

## 2019-03-01 NOTE — Telephone Encounter (Signed)
LMTCB x2 for Katie with [facility]

## 2019-03-01 NOTE — Telephone Encounter (Signed)
Yes.  Thank you.

## 2019-03-04 NOTE — Telephone Encounter (Signed)
Appt has been rescheduled at pt request

## 2019-03-22 ENCOUNTER — Other Ambulatory Visit: Payer: Self-pay | Admitting: Endocrinology

## 2019-03-22 ENCOUNTER — Other Ambulatory Visit: Payer: Self-pay | Admitting: Family Medicine

## 2019-03-22 DIAGNOSIS — E7800 Pure hypercholesterolemia, unspecified: Secondary | ICD-10-CM

## 2019-03-22 MED ORDER — SEMAGLUTIDE (1 MG/DOSE) 2 MG/1.5ML ~~LOC~~ SOPN: 1.0000 mg | PEN_INJECTOR | SUBCUTANEOUS | 1 refills | Status: AC

## 2019-03-22 MED ORDER — TRAMADOL HCL 50 MG PO TABS: ORAL_TABLET | ORAL | 1 refills | Status: AC

## 2019-03-22 NOTE — Telephone Encounter (Signed)
Please advise 

## 2019-03-22 NOTE — Telephone Encounter (Signed)
Requested medication (s) are due for refill today: Yes  Requested medication (s) are on the active medication list: Yes  Last refill:  10/16/18  Future visit scheduled: Yes  Notes to clinic:  Unable to refill, cannot delegate     Requested Prescriptions  Pending Prescriptions Disp Refills   traMADol (ULTRAM) 50 MG tablet 100 tablet 1    Sig: TAKE 1/2 TAB BY MOUTH TWICE A DAY     Not Delegated - Analgesics:  Opioid Agonists Failed - 03/22/2019  9:19 AM      Failed - This refill cannot be delegated      Failed - Urine Drug Screen completed in last 360 days.      Passed - Valid encounter within last 6 months    Recent Outpatient Visits          3 months ago Encounter for general adult medical examination with abnormal findings   [place] [facility], Algis Greenhouse, MD   4 months ago CKD stage 3 due to type 2 diabetes mellitus [facility])   [place] [facility], Algis Greenhouse, MD   5 months ago Hypertension associated with diabetes [facility])   [place] [facility], Algis Greenhouse, MD   8 months ago Sebaceous cyst   [facility], Ishmael Holter, MD   1 year ago Essential hypertension   Therapist, [facility] at [employer], Jory Ee, MD

## 2019-03-22 NOTE — Telephone Encounter (Signed)
Pt states that he feels the worst in the mornings. That's when he has noticed the lowest readings.

## 2019-03-22 NOTE — Telephone Encounter (Signed)
See note

## 2019-03-22 NOTE — Telephone Encounter (Signed)
Disregard previous note. Pt advised that he ad mistaken what I had asked. He is able to tolerate the medication well. Just needed it filled to pharmacy in [facility] Med sent.

## 2019-03-22 NOTE — Telephone Encounter (Signed)
MEDICATION: OZEMPIC, 1 MG/DOSE, 2 MG/1.5ML SOPN  PHARMACY:  [place], [place], [place]  IS THIS A 90 DAY SUPPLY :   IS PATIENT OUT OF MEDICATION: Yes  IF NOT; HOW MUCH IS LEFT:   LAST APPOINTMENT DATE: @3 /24/2020  NEXT APPOINTMENT DATE:@5 /29/2020  DO WE HAVE YOUR PERMISSION TO LEAVE A DETAILED MESSAGE:  OTHER COMMENTS:  Patient moved to [place]  **Let patient know to contact pharmacy at the end of the day to make sure medication is ready. **  ** Please notify patient to allow 48-72 hours to process**  **Encourage patient to contact the pharmacy for refills or they can request refills through [facility]**

## 2019-03-22 NOTE — Telephone Encounter (Signed)
I need to know how low has cbg gone then?  Thank you.

## 2019-04-09 ENCOUNTER — Telehealth: Payer: Self-pay | Admitting: Pulmonary Disease

## 2019-04-09 DIAGNOSIS — G4733 Obstructive sleep apnea (adult) (pediatric): Secondary | ICD-10-CM

## 2019-04-09 NOTE — Telephone Encounter (Signed)
Spoke with pt, he states he would like to get set up for his bipap machine now but he lives in [place]. I googled DME companies and saw there was one DME company called [facility]. I advised pt and placed order to [facility]. Nothing further is needed.

## 2019-04-10 ENCOUNTER — Telehealth: Payer: Self-pay | Admitting: Pulmonary Disease

## 2019-04-10 DIAGNOSIS — G4733 Obstructive sleep apnea (adult) (pediatric): Secondary | ICD-10-CM

## 2019-04-10 NOTE — Telephone Encounter (Signed)
New order placed to [facility]  Order placed yesterday has been cancelled. Fax # placed inside referral Patient made aware he will need f/u 31-90 day bipap compliance. Nothing further needed.

## 2019-04-10 NOTE — Telephone Encounter (Signed)
Returned call to patient making him aware [facility] is not in network with his insurance.  He will need to contact his insurance company to find out what DME company is in the [facility] area. Asked that he write it down and call office back with info so that order can be changed. Pt will call office back.

## 2019-04-15 ENCOUNTER — Telehealth: Payer: Self-pay | Admitting: Endocrinology

## 2019-04-15 ENCOUNTER — Other Ambulatory Visit: Payer: Self-pay

## 2019-04-15 MED ORDER — BROMOCRIPTINE MESYLATE 5 MG PO CAPS: 5.0000 mg | ORAL_CAPSULE | Freq: Every day | ORAL | 0 refills | Status: DC

## 2019-04-15 NOTE — Telephone Encounter (Signed)
MEDICATION: bromocriptine (PARLODEL) 5 MG capsule  PHARMACY:  [place]  IS THIS A 90 DAY SUPPLY :   IS PATIENT OUT OF MEDICATION:   IF NOT; HOW MUCH IS LEFT:   LAST APPOINTMENT DATE: @4 /24/2020  NEXT APPOINTMENT DATE:@5 /29/2020  DO WE HAVE YOUR PERMISSION TO LEAVE A DETAILED MESSAGE:  OTHER COMMENTS:    **Let patient know to contact pharmacy at the end of the day to make sure medication is ready. **  ** Please notify patient to allow 48-72 hours to process**  **Encourage patient to contact the pharmacy for refills or they can request refills through [facility]

## 2019-04-15 NOTE — Telephone Encounter (Signed)
Semaglutide, 1 MG/DOSE, (OZEMPIC, 1 MG/DOSE,) 2 MG/1.5ML SOPN 4 mL 1 03/22/2019    Sig - Route: Inject 1 mg into the skin once a week. - Subcutaneous   Sent to pharmacy as: Semaglutide, 1 MG/DOSE, (OZEMPIC, 1 MG/DOSE,) 2 MG/1.5ML Solution Pen-injector   E-Prescribing Status: Receipt confirmed by pharmacy (03/22/2019 3:16 PM EDT)

## 2019-04-16 NOTE — Telephone Encounter (Signed)
Dr Jerline Pain, [place] for requested med change?

## 2019-04-16 NOTE — Telephone Encounter (Signed)
[facility] called ([facility]) Stating they cannot fill the Bromorciptine (Parlodel) 5 mg. Order 2.5 tablets and double that, Need PCP approval.   [phone]

## 2019-04-17 ENCOUNTER — Other Ambulatory Visit: Payer: Self-pay

## 2019-04-17 MED ORDER — BROMOCRIPTINE MESYLATE 2.5 MG PO TABS: 5.0000 mg | ORAL_TABLET | Freq: Every day | ORAL | 1 refills | Status: DC

## 2019-04-17 NOTE — Telephone Encounter (Signed)
Ok with me. Please place any necessary orders. 

## 2019-04-17 NOTE — Telephone Encounter (Signed)
Rx sent to pharmacy   

## 2019-04-24 ENCOUNTER — Other Ambulatory Visit: Payer: Self-pay

## 2019-04-26 ENCOUNTER — Encounter: Payer: Self-pay | Admitting: Endocrinology

## 2019-04-26 ENCOUNTER — Other Ambulatory Visit: Payer: Self-pay

## 2019-04-26 ENCOUNTER — Ambulatory Visit: Payer: Managed Care, Other (non HMO) | Admitting: Endocrinology

## 2019-04-26 VITALS — BP 144/80 | HR 63 | Temp 98.1°F | Wt 246.0 lb

## 2019-04-26 DIAGNOSIS — N182 Chronic kidney disease, stage 2 (mild): Secondary | ICD-10-CM

## 2019-04-26 DIAGNOSIS — E1122 Type 2 diabetes mellitus with diabetic chronic kidney disease: Secondary | ICD-10-CM

## 2019-04-26 MED ORDER — ACARBOSE 100 MG PO TABS: 100.0000 mg | ORAL_TABLET | Freq: Three times a day (TID) | ORAL | 3 refills | Status: AC

## 2019-04-26 NOTE — Patient Instructions (Addendum)
I have sent a prescription to your pharmacy, to increase the acarbose.   Please continue the same other diabetes medications.   check your blood sugar once a day.  vary the time of day when you check, between before the 3 meals, and at bedtime.  also check if you have symptoms of your blood sugar being too high or too low.  please keep a record of the readings and bring it to your next appointment here (or you can bring the meter itself).  You can write it on any piece of paper.  please call us sooner if your blood sugar goes below 70, or if you have a lot of readings over 200.   Please come back for a follow-up appointment in 3 months.

## 2019-04-26 NOTE — Progress Notes (Signed)
Subjective:    Patient ID: Bryan Haley, male    DOB: 02-12-1951, 68 y.o.   MRN: [id number]  HPI Pt returns for f/u of diabetes mellitus: DM type: 2 Dx'ed: 2001.  Complications: renal insufficiency and CAD.  Therapy: ozempic and 5 oral meds.   DKA: never.  Severe hypoglycemia: never.   Pancreatitis: never.   Other: insurance declines weight-loss surgery; he took insulin 2009-2016; oral rx options are limited by renal insuff and edema.   Interval history:  no cbg record, but states cbg's vary from 180-210.  pt states he feels well in general.   Past Medical History:  Diagnosis Date  . Allergy   . Arthritis    feet  . Coronary artery disease   . Coronary atherosclerosis of native coronary artery 12/05/2013   Prox RCA, and PLA DES 12/04/13.   . Diabetic retinopathy of both eyes ([place])    Laser surgery, Dr. Zadie Rhine  . 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 Exertional shortness of breath    "last 2-3 yrs" (12/04/2013)  . GOUT 07/11/2007  . Hyperlipidemia   . HYPERTENSION 07/06/2007  . HYPOGONADISM 06/11/2010  . OSA (obstructive sleep apnea) 10/08/2009   wears CPAP "more than 1/2 the time"  . Other and unspecified angina pectoris   . RENAL INSUFFICIENCY 12/11/2008  . Serrated polyp of colon 2014  . Sleep apnea    no cpap     Past Surgical History:  Procedure Laterality Date  . CARPAL TUNNEL RELEASE Left ~ 2004  . COLONOSCOPY    . COLONOSCOPY WITH PROPOFOL N/A 11/14/2018   Procedure: COLONOSCOPY WITH PROPOFOL;  Surgeon: Rush Landmark Telford Nab., MD;  Location: [facility];  Service: Gastroenterology;  Laterality: N/A;  . CORONARY ANGIOPLASTY WITH STENT PLACEMENT  12/04/2013   "2 stents"  . EYE EXAMINATION UNDER ANESTHESIA W/ RETINAL CRYOTHERAPY AND RETINAL LASER Bilateral 2005-2010   "for my diabetes" (12/04/2013)  . HOT HEMOSTASIS N/A 11/14/2018   Procedure: HOT HEMOSTASIS (ARGON PLASMA COAGULATION/BICAP);  Surgeon: Irving Copas., MD;  Location: [facility];  Service: Gastroenterology;  Laterality: N/A;  . LEFT HEART CATHETERIZATION WITH CORONARY ANGIOGRAM Bilateral 12/04/2013   Procedure: LEFT HEART CATHETERIZATION WITH CORONARY ANGIOGRAM;  Surgeon: Candee Furbish, MD;  Location: [facility] C[facility] LAB;  Service: Cardiovascular;  Laterality: Bilateral;  . POLYPECTOMY    . SUBMUCOSAL INJECTION  11/14/2018   Procedure: SUBMUCOSAL INJECTION;  Surgeon: Rush Landmark Telford Nab., MD;  Location: [facility];  Service: Gastroenterology;;    Social History   Socioeconomic History  . Marital status: Married    Spouse name: Not on file  . Number of children: Not on file  . Years of education: Not on file  . Highest education level: Not on file  Occupational History  . Occupation: Geographical information systems officer  . Financial resource strain: Not on file  . Food insecurity:    Worry: Not on file    Inability: Not on file  . Transportation needs:    Medical: Not on file    Non-medical: Not on file  Tobacco Use  . Smoking status: Former Smoker    Packs/day: 1.50    Years: 45.00    Pack years: 67.50    Types: Cigarettes    Last attempt to quit: 10/02/2010    Years since quitting: 8.5  . Smokeless tobacco: Never Used  Substance and Sexual Activity  . Alcohol use: Yes  Alcohol/week: 1.0 standard drinks    Types: 1 Shots of liquor per week  . Drug use: No  . Sexual activity: Yes  Lifestyle  . Physical activity:    Days per week: Not on file    Minutes per session: Not on file  . Stress: Not on file  Relationships  . Social connections:    Talks on phone: Not on file    Gets together: Not on file    Attends religious service: Not on file    Active member of club or organization: Not on file    Attends meetings of clubs or organizations: Not on file    Relationship status: Not on file  . Intimate partner violence:    Fear of current or ex partner: Not on file    Emotionally abused: Not on file    Physically abused: Not  on file    Forced sexual activity: Not on file  Other Topics Concern  . Not on file  Social History Narrative   Regular exercise-no   Pt went to seminar to quit smoking    Current Outpatient Medications on File Prior to Visit  Medication Sig Dispense Refill  . allopurinol (ZYLOPRIM) 300 MG tablet Take 1 tablet (300 mg total) by mouth daily. 90 tablet 3  . amLODipine (NORVASC) 10 MG tablet Take 1 tablet (10 mg total) by mouth daily. 90 tablet 3  . aspirin 81 MG chewable tablet Chew 1 tablet (81 mg total) by mouth daily. Resume Tuesday 11/20/18    . bromocriptine (PARLODEL) 2.5 MG tablet Take 2 tablets (5 mg total) by mouth daily. 60 tablet 1  . canagliflozin (INVOKANA) 300 MG TABS tablet Take 1 tablet (300 mg total) by mouth daily before breakfast. 90 tablet 3  . colesevelam (WELCHOL) 625 MG tablet Take 2 tablets (1,250 mg total) by mouth daily. 180 tablet 3  . doxazosin (CARDURA) 8 MG tablet Take 1 tablet (8 mg total) by mouth at bedtime. 90 tablet 3  . fluticasone (FLONASE) 50 MCG/ACT nasal spray PLACE 1 SPRAY INTO BOTH NOSTRILS DAILY 32 g 10  . glucose blood (ONE TOUCH ULTRA TEST) test strip USE TO TEST BLOOD USGAR TWICE A DAY 100 each 3  . repaglinide (PRANDIN) 2 MG tablet TAKE 2 TABLETS BY MOUTH THREE TIMES DAILY BEFORE MEAL(S) 540 tablet 0  . Semaglutide, 1 MG/DOSE, (OZEMPIC, 1 MG/DOSE,) 2 MG/1.5ML SOPN Inject 1 mg into the skin once a week. 4 mL 1  . simvastatin (ZOCOR) 40 MG tablet Take 1 tablet (40 mg total) by mouth at bedtime. 90 tablet 3  . traMADol (ULTRAM) 50 MG tablet TAKE 1/2 TAB BY MOUTH TWICE A DAY 100 tablet 1  . varenicline (CHANTIX CONTINUING MONTH PAK) 1 MG tablet Take 1 tablet (1 mg total) by mouth daily. 30 tablet 0   No current facility-administered medications on file prior to visit.     No Known Allergies  Family History  Problem Relation Age of Onset  . Cancer Mother   . Pancreatic cancer Mother   . Heart disease Father   . Heart attack Father   .  Diabetes Other   . Hyperlipidemia Other   . Hypertension Other   . Rectal cancer Sister   . Colon cancer Neg Hx   . Esophageal cancer Neg Hx   . Stomach cancer Neg Hx   . Stroke Neg Hx   . Colon polyps Neg Hx     BP (!) 144/80 (BP Location: Right Arm,  Patient Position: Sitting, Cuff Size: Normal)   Pulse 63   Temp 98.1 F (36.7 C) (Oral)   Wt 246 lb (111.6 kg)   SpO2 98%   BMI 35.30 kg/m    Review of Systems He denies hypoglycemia.      Objective:   Physical Exam VITAL SIGNS:  See vs page GENERAL: no distress Pulses: dorsalis pedis intact bilat.   MSK: no deformity of the feet CV: trace bilat leg edema Skin:  no ulcer on the feet.  normal color and temp on the feet. Neuro: sensation is intact to touch on the feet.  Ext: There is bilateral onychomycosis of the toenails.    Lab Results  Component Value Date   CREATININE 1.77 (H) 12/13/2018   BUN 22 12/13/2018   NA 140 12/13/2018   K 4.0 12/13/2018   CL 104 12/13/2018   CO2 29 12/13/2018       Assessment & Plan:  Type 2 DM: worse.  Edema: This limits rx options.  HTN: recheck next time.    Patient Instructions  I have sent a prescription to your pharmacy, to increase the acarbose.   Please continue the same other diabetes medications.   check your blood sugar once a day.  vary the time of day when you check, between before the 3 meals, and at bedtime.  also check if you have symptoms of your blood sugar being too high or too low.  please keep a record of the readings and bring it to your next appointment here (or you can bring the meter itself).  You can write it on any piece of paper.  please call us sooner if your blood sugar goes below 70, or if you have a lot of readings over 200.   Please come back for a follow-up appointment in 3 months.

## 2019-04-29 ENCOUNTER — Encounter: Payer: Self-pay | Admitting: Gastroenterology

## 2019-05-10 ENCOUNTER — Ambulatory Visit: Payer: Managed Care, Other (non HMO) | Admitting: Endocrinology

## 2019-06-07 ENCOUNTER — Other Ambulatory Visit: Payer: Self-pay

## 2019-06-07 ENCOUNTER — Telehealth: Payer: Self-pay | Admitting: Endocrinology

## 2019-06-07 DIAGNOSIS — E1122 Type 2 diabetes mellitus with diabetic chronic kidney disease: Secondary | ICD-10-CM

## 2019-06-07 MED ORDER — REPAGLINIDE 2 MG PO TABS: ORAL_TABLET | ORAL | 0 refills | Status: AC

## 2019-06-07 NOTE — Telephone Encounter (Signed)
MEDICATION: repaglinide (PRANDIN) 2 MG tablet  PHARMACY:      [facility] :   IS PATIENT OUT OF MEDICATION:   IF NOT; HOW MUCH IS LEFT: 1-2 days  LAST APPOINTMENT DATE: @5 /29/2020  NEXT APPOINTMENT DATE:@9 /02/2019  DO WE HAVE YOUR PERMISSION TO LEAVE A DETAILED MESSAGE:  OTHER COMMENTS:    **Let patient know to contact pharmacy at the end of the day to make sure medication is ready. **  ** Please notify patient to allow 48-72 hours to process**  **Encourage patient to contact the pharmacy for refills or they can request refills through [facility]**

## 2019-06-07 NOTE — Telephone Encounter (Signed)
repaglinide (PRANDIN) 2 MG tablet 540 tablet 0 06/07/2019    Sig: TAKE 2 TABLETS BY MOUTH THREE TIMES DAILY BEFORE MEAL(S)   Sent to pharmacy as: repaglinide (PRANDIN) 2 MG tablet   E-Prescribing Status: Receipt confirmed by pharmacy (06/07/2019 8:30 AM EDT)

## 2019-06-14 ENCOUNTER — Other Ambulatory Visit: Payer: Self-pay

## 2019-06-14 ENCOUNTER — Ambulatory Visit: Payer: Managed Care, Other (non HMO) | Admitting: *Deleted

## 2019-06-14 VITALS — Ht 70.0 in | Wt 251.0 lb

## 2019-06-14 DIAGNOSIS — Z86010 Personal history of colonic polyps: Secondary | ICD-10-CM

## 2019-06-14 MED ORDER — SUPREP BOWEL PREP KIT 17.5-3.13-1.6 GM/177ML PO SOLN: 1.0000 | Freq: Once | ORAL | 0 refills | Status: AC

## 2019-06-14 NOTE — Progress Notes (Signed)
No egg or soy allergy known to patient  No issues with past sedation with any surgeries  or procedures, no intubation problems  No diet pills per patient No home 02 use per patient  No blood thinners per patient  Pt denies issues with constipation  No A fib or A flutter  EMMI video sent to pt's e mail   Pt verified name, DOB, address and insurance during PV today. Pt mailed instruction packet to included paper to complete and mail back to [facility] with addressed and stamped envelope, Emmi video, copy of consent form to read and not return, and instructions.PV completed over the phone. Pt encouraged to call with questions or issues - Suprep $15 coupon to pt.   Pt is aware that care partner will wait in the car during procedure; if they feel like they will be too hot to wait in the car; they may wait in the lobby.  We want them to wear a mask (we do not have any that we can provide them), practice social distancing, and we will check their temperatures when they get here.  I did remind patient that their care partner needs to stay in the parking lot the entire time. Pt will wear mask into building.

## 2019-06-20 ENCOUNTER — Other Ambulatory Visit: Payer: Self-pay

## 2019-06-20 ENCOUNTER — Telehealth: Payer: Self-pay | Admitting: Family Medicine

## 2019-06-20 MED ORDER — BROMOCRIPTINE MESYLATE 2.5 MG PO TABS: 5.0000 mg | ORAL_TABLET | Freq: Every day | ORAL | 0 refills | Status: AC

## 2019-06-20 NOTE — Telephone Encounter (Signed)
Rx sent to pharmacy   

## 2019-06-20 NOTE — Telephone Encounter (Signed)
Copied from [place] [id number]. Topic: Quick Communication - Rx Refill/Question >> Jun 20, 2019  1:34 PM Nils Flack wrote: Medication: bromocriptine (PARLODEL) 2.5 MG tablet 90 day supply with refills  Has the patient contacted their pharmacy? Yes.   (Agent: If no, request that the patient contact the pharmacy for the refill.) (Agent: If yes, when and what did the pharmacy advise?)  Preferred Pharmacy (with phone number or street name): [place], [place] is out  Pharm told pt that they have sent several requests  Agent: Please be advised that RX refills may take up to 3 business days. We ask that you follow-up with your pharmacy.

## 2019-06-27 ENCOUNTER — Telehealth: Payer: Self-pay | Admitting: Gastroenterology

## 2019-06-27 NOTE — Telephone Encounter (Signed)

## 2019-06-28 ENCOUNTER — Other Ambulatory Visit: Payer: Self-pay

## 2019-06-28 ENCOUNTER — Encounter: Payer: Self-pay | Admitting: Gastroenterology

## 2019-06-28 ENCOUNTER — Ambulatory Visit (AMBULATORY_SURGERY_CENTER): Payer: Managed Care, Other (non HMO) | Admitting: Gastroenterology

## 2019-06-28 VITALS — BP 144/83 | HR 62 | Temp 98.7°F | Resp 15 | Ht 70.0 in | Wt 251.0 lb

## 2019-06-28 DIAGNOSIS — D122 Benign neoplasm of ascending colon: Secondary | ICD-10-CM

## 2019-06-28 DIAGNOSIS — D123 Benign neoplasm of transverse colon: Secondary | ICD-10-CM

## 2019-06-28 DIAGNOSIS — Z86010 Personal history of colonic polyps: Secondary | ICD-10-CM

## 2019-06-28 DIAGNOSIS — D120 Benign neoplasm of cecum: Secondary | ICD-10-CM

## 2019-06-28 DIAGNOSIS — D124 Benign neoplasm of descending colon: Secondary | ICD-10-CM

## 2019-06-28 MED ORDER — SODIUM CHLORIDE 0.9 % IV SOLN: 500.0000 mL | Freq: Once | INTRAVENOUS | Status: DC

## 2019-06-28 NOTE — Progress Notes (Signed)
Pt's states no medical or surgical changes since previsit or office visit.  Temp-June bullock  Vital signs-megan oliver

## 2019-06-28 NOTE — Op Note (Addendum)
[facility] Patient Name: Bryan Haley Procedure Date: 06/28/2019 7:30 AM MRN: [id number] Endoscopist: Ladene Artist , MD Age: 68 Referring MD:  Date of Birth: 1951-01-19 Gender: Male Account #: [id number] Procedure:                Colonoscopy Indications:              Surveillance: Personal history of piecemeal removal                            of large sessile adenoma on last colonoscopy (less                            than 1 year ago) Medicines:                Monitored Anesthesia Care Procedure:                Pre-Anesthesia Assessment:                           - Prior to the procedure, a History and Physical                            was performed, and patient medications and                            allergies were reviewed. The patient's tolerance of                            previous anesthesia was also reviewed. The risks                            and benefits of the procedure and the sedation                            options and risks were discussed with the patient.                            All questions were answered, and informed consent                            was obtained. Prior Anticoagulants: The patient has                            taken no previous anticoagulant or antiplatelet                            agents. ASA Grade Assessment: II - A patient with                            mild systemic disease. After reviewing the risks                            and benefits, the patient was deemed in  satisfactory condition to undergo the procedure.                           After obtaining informed consent, the colonoscope                            was passed under direct vision. Throughout the                            procedure, the patient's blood pressure, pulse, and                            oxygen saturations were monitored continuously. The                            Colonoscope was introduced through the  anus and                            advanced to the the cecum, identified by                            appendiceal orifice and ileocecal valve. The                            ileocecal valve, appendiceal orifice, and rectum                            were photographed. The quality of the bowel                            preparation was good. The colonoscopy was performed                            without difficulty. The patient tolerated the                            procedure well. Scope In: 7:33:45 AM Scope Out: 8:04:32 AM Scope Withdrawal Time: 0 hours 28 minutes 35 seconds  Total Procedure Duration: 0 hours 30 minutes 47 seconds  Findings:                 The perianal and digital rectal examinations were                            normal.                           Four sessile polyps were found in the cecum. The                            polyps were 5 to 6 mm in size. These polyps were                            removed with a cold snare. Resection and retrieval  were complete. 1 previously placed clip in the                            cecum at site of 1 polyp.                           Two sessile polyps were found in the ascending                            colon. The polyps were 6 to 7 mm in size. These                            polyps were removed with a cold snare. Resection                            and retrieval were complete. 3 previously placed                            clips separate from polyp sites.                           Eleven sessile polyps were found in the transverse                            colon. The polyps were 7 to 10 mm in size. These                            polyps were removed with a cold snare. Resection                            and retrieval were complete. 1 previously placed                            clip separate from polyp sites.                           A 7 mm polyp was found in the descending colon. The                             polyp was sessile. The polyp was removed with a                            cold snare. Resection and retrieval were complete.                           A few medium-mouthed diverticula were found in the                            left colon.                           Internal hemorrhoids were found during  retroflexion. The hemorrhoids were small and Grade                            I (internal hemorrhoids that do not prolapse).                           The exam was otherwise without abnormality on                            direct and retroflexion views. Complications:            No immediate complications. Estimated blood loss:                            None. Estimated Blood Loss:     Estimated blood loss: none. Impression:               - Four 5 to 6 mm polyps in the cecum, removed with                            a cold snare. Resected and retrieved.                           - Two 6 to 7 mm polyps in the ascending colon,                            removed with a cold snare. Resected and retrieved.                           - Eleven 7 to 10 mm polyps in the transverse colon,                            removed with a cold snare. Resected and retrieved.                           - One 7 mm polyp in the descending colon, removed                            with a cold snare. Resected and retrieved.                           - Diverticulosis in the left colon.                           - Internal hemorrhoids.                           - The examination was otherwise normal on direct                            and retroflexion views. Recommendation:           - Repeat colonoscopy in 1 year for surveillance.                           -  Patient has a contact number available for                            emergencies. The signs and symptoms of potential                            delayed complications were discussed with the                             patient. Return to normal activities tomorrow.                            Written discharge instructions were provided to the                            patient.                           - Resume previous diet.                           - Continue present medications.                           - Await pathology results.                           - Schedule genetic testing likely in [place] or                            [place].                           - No aspirin, ibuprofen, naproxen, or other                            non-steroidal anti-inflammatory drugs for 2 weeks                            after polyp removal. Ladene Artist, MD 06/28/2019 8:15:12 AM This report has been signed electronically.

## 2019-06-28 NOTE — Progress Notes (Signed)
Called to room to assist during endoscopic procedure.  Patient ID and intended procedure confirmed with present staff. Received instructions for my participation in the procedure from the performing physician.  

## 2019-06-28 NOTE — Patient Instructions (Signed)
Handouts given for polyps, diverticulosis and hemorrhoids.  Consider genetic testing.  No aspirin, ibuprofen, naproxen or NSAIDS for 2 weeks.  You may use tylenol.  YOU HAD AN ENDOSCOPIC PROCEDURE TODAY AT [facility]:   Refer to the procedure report that was given to you for any specific questions about what was found during the examination.  If the procedure report does not answer your questions, please call your gastroenterologist to clarify.  If you requested that your care partner not be given the details of your procedure findings, then the procedure report has been included in a sealed envelope for you to review at your convenience later.  YOU SHOULD EXPECT: Some feelings of bloating in the abdomen. Passage of more gas than usual.  Walking can help get rid of the air that was put into your GI tract during the procedure and reduce the bloating. If you had a lower endoscopy (such as a colonoscopy or flexible sigmoidoscopy) you may notice spotting of blood in your stool or on the toilet paper. If you underwent a bowel prep for your procedure, you may not have a normal bowel movement for a few days.  Please Note:  You might notice some irritation and congestion in your nose or some drainage.  This is from the oxygen used during your procedure.  There is no need for concern and it should clear up in a day or so.  SYMPTOMS TO REPORT IMMEDIATELY:   Following lower endoscopy (colonoscopy or flexible sigmoidoscopy):  Excessive amounts of blood in the stool  Significant tenderness or worsening of abdominal pains  Swelling of the abdomen that is new, acute  Fever of 100F or higher   For urgent or emergent issues, a gastroenterologist can be reached at any hour by calling [phone].   DIET:  We do recommend a small meal at first, but then you may proceed to your regular diet.  Drink plenty of fluids but you should avoid alcoholic beverages for 24 hours.  ACTIVITY:  You  should plan to take it easy for the rest of today and you should NOT DRIVE or use heavy machinery until tomorrow (because of the sedation medicines used during the test).    FOLLOW UP: Our staff will call the number listed on your records 48-72 hours following your procedure to check on you and address any questions or concerns that you may have regarding the information given to you following your procedure. If we do not reach you, we will leave a message.  We will attempt to reach you two times.  During this call, we will ask if you have developed any symptoms of COVID 19. If you develop any symptoms (ie: fever, flu-like symptoms, shortness of breath, cough etc.) before then, please call [phone].  If you test positive for Covid 19 in the 2 weeks post procedure, please call and report this information to [place].    If any biopsies were taken you will be contacted by phone or by letter within the next 1-3 weeks.  Please call us at [phone] if you have not heard about the biopsies in 3 weeks.    SIGNATURES/CONFIDENTIALITY: You and/or your care partner have signed paperwork which will be entered into your electronic medical record.  These signatures attest to the fact that that the information above on your After Visit Summary has been reviewed and is understood.  Full responsibility of the confidentiality of this discharge information lies with you and/or your care-partner.

## 2019-06-28 NOTE — Progress Notes (Signed)
Report to PACU, RN, vss, BBS= Clear.  

## 2019-07-02 ENCOUNTER — Telehealth: Payer: Self-pay | Admitting: *Deleted

## 2019-07-02 NOTE — Telephone Encounter (Signed)
  Follow up Call-  Call back number 06/28/2019 11/13/2018  Post procedure Call Back phone  # [phone]  Permission to leave phone message Yes Yes  Some recent data might be hidden     Patient questions:  Do you have a fever, pain , or abdominal swelling? No. Pain Score  0 *  Have you tolerated food without any problems? Yes.    Have you been able to return to your normal activities? Yes.    Do you have any questions about your discharge instructions: Diet   No. Medications  No. Follow up visit  No.  Do you have questions or concerns about your Care? No.  Actions: * If pain score is 4 or above: No action needed, pain <4.  1. Have you developed a fever since your procedure?no  2.   Have you had an respiratory symptoms (SOB or cough) since your procedure? no  3.   Have you tested positive for COVID 19 since your procedure no  4.   Have you had any family members/close contacts diagnosed with the COVID 19 since your procedure?  no   If yes to any of these questions please route to Joylene John, RN and Alphonsa Gin, Therapist, sports.

## 2019-07-05 ENCOUNTER — Encounter: Payer: Self-pay | Admitting: Gastroenterology

## 2019-08-02 ENCOUNTER — Ambulatory Visit (INDEPENDENT_AMBULATORY_CARE_PROVIDER_SITE_OTHER): Payer: Managed Care, Other (non HMO) | Admitting: Endocrinology

## 2019-08-02 ENCOUNTER — Other Ambulatory Visit: Payer: Self-pay

## 2019-08-02 ENCOUNTER — Encounter: Payer: Self-pay | Admitting: Endocrinology

## 2019-08-02 VITALS — Ht 70.0 in

## 2019-08-02 DIAGNOSIS — N182 Chronic kidney disease, stage 2 (mild): Secondary | ICD-10-CM

## 2019-08-02 DIAGNOSIS — E1122 Type 2 diabetes mellitus with diabetic chronic kidney disease: Secondary | ICD-10-CM | POA: Diagnosis not present

## 2019-08-02 NOTE — Progress Notes (Signed)
Subjective:    Patient ID: Bryan Haley, male    DOB: 20-Sep-1951, 68 y.o.   MRN: [id number]  HPI  telehealth visit today via phone x 6 minutes Alternatives to telehealth are presented to this patient, and the patient agrees to the telehealth visit.   Pt is advised of the cost of the visit, and agrees to this, also.   Patient is at home, and I am at the office.   Persons attending the telehealth visit: the patient and I Pt returns for f/u of diabetes mellitus: DM type: 2 Dx'ed: 2001.  Complications: renal insufficiency and CAD.   Therapy: ozempic and 5 oral meds.   DKA: never.  Severe hypoglycemia: never.   Pancreatitis: never.   Other: insurance declines weight-loss surgery; he took insulin 2009-2016; oral rx options are limited by renal insuff and edema.   Interval history:  no cbg record, but states cbg's vary from 160-215.  It is in general higher as the day goes on.  He says it is highest fasting.  pt states he feels well in general.  Pt says a friend can order A1c there in [place].   Past Medical History:  Diagnosis Date   Allergy    Arthritis    feet   Blood transfusion without reported diagnosis    6 pints after post polypectomy bleed 2019   Colonoscopy causing post-procedural bleeding    Coronary artery disease    Coronary atherosclerosis of native coronary artery 12/05/2013   Prox RCA, and PLA DES 12/04/13.    Diabetic retinopathy of both eyes ([place])    Laser surgery, Dr. Zadie Rhine   DM W/COMPLICATION NOS, TYPE I 08/09/2007   "dx'd ~ 1994; I say it's type II" (12/04/2013)   Exertional shortness of breath    "last 2-3 yrs" (12/04/2013)   GOUT 07/11/2007   Hyperlipidemia    HYPERTENSION 07/06/2007   HYPOGONADISM 06/11/2010   OSA (obstructive sleep apnea) 10/08/2009   wears CPAP "more than 1/2 the time"   Other and unspecified angina pectoris    RENAL INSUFFICIENCY 12/11/2008   Serrated polyp of colon 2014   Sleep apnea    wears bpap    Past Surgical History:   Procedure Laterality Date   CARPAL TUNNEL RELEASE Left ~ 2004   COLONOSCOPY     COLONOSCOPY WITH PROPOFOL N/A 11/14/2018   Procedure: COLONOSCOPY WITH PROPOFOL;  Surgeon: Irving Copas., MD;  Location: [facility];  Service: Gastroenterology;  Laterality: N/A;   CORONARY ANGIOPLASTY WITH STENT PLACEMENT  12/04/2013   "2 stents"   EYE EXAMINATION UNDER ANESTHESIA W/ RETINAL CRYOTHERAPY AND RETINAL LASER Bilateral 2005-2010   "for my diabetes" (12/04/2013)   HOT HEMOSTASIS N/A 11/14/2018   Procedure: HOT HEMOSTASIS (ARGON PLASMA COAGULATION/BICAP);  Surgeon: Irving Copas., MD;  Location: [facility];  Service: Gastroenterology;  Laterality: N/A;   LEFT HEART CATHETERIZATION WITH CORONARY ANGIOGRAM Bilateral 12/04/2013   Procedure: LEFT HEART CATHETERIZATION WITH CORONARY ANGIOGRAM;  Surgeon: Candee Furbish, MD;  Location: [facility];  Service: Cardiovascular;  Laterality: Bilateral;   POLYPECTOMY     SUBMUCOSAL INJECTION  11/14/2018   Procedure: SUBMUCOSAL INJECTION;  Surgeon: Rush Landmark Telford Nab., MD;  Location: [facility];  Service: Gastroenterology;;    Social History   Socioeconomic History   Marital status: Married    Spouse name: Not on file   Number of children: Not on file   Years of education: Not on file   Highest education level: Not on file  Occupational History   Occupation: Environmental consultant strain: Not on file   Food insecurity    Worry: Not on file    Inability: Not on file   Transportation needs    Medical: Not on file    Non-medical: Not on file  Tobacco Use   Smoking status: Former Smoker    Packs/day: 1.50    Years: 45.00    Pack years: 67.50    Types: Cigarettes    Quit date: 10/02/2010    Years since quitting: 8.8   Smokeless tobacco: Never Used  Substance and Sexual Activity   Alcohol use: Yes    Alcohol/week: 1.0 standard drinks    Types: 1 Shots of liquor per week    Comment: occ.    Drug use: No   Sexual activity: Yes  Lifestyle   Physical activity    Days per week: Not on file    Minutes per session: Not on file   Stress: Not on file  Relationships   Social connections    Talks on phone: Not on file    Gets together: Not on file    Attends religious service: Not on file    Active member of club or organization: Not on file    Attends meetings of clubs or organizations: Not on file    Relationship status: Not on file   Intimate partner violence    Fear of current or ex partner: Not on file    Emotionally abused: Not on file    Physically abused: Not on file    Forced sexual activity: Not on file  Other Topics Concern   Not on file  Social History Narrative   Regular exercise-no   Pt went to seminar to quit smoking    Current Outpatient Medications on File Prior to Visit  Medication Sig Dispense Refill   acarbose (PRECOSE) 100 MG tablet Take 1 tablet (100 mg total) by mouth 3 (three) times daily with meals. 270 tablet 3   allopurinol (ZYLOPRIM) 300 MG tablet Take 1 tablet (300 mg total) by mouth daily. 90 tablet 3   amLODipine (NORVASC) 10 MG tablet Take 1 tablet (10 mg total) by mouth daily. 90 tablet 3   aspirin 81 MG chewable tablet Chew 1 tablet (81 mg total) by mouth daily. Resume Tuesday 11/20/18     bromocriptine (PARLODEL) 2.5 MG tablet Take 2 tablets (5 mg total) by mouth daily. 180 tablet 0   canagliflozin (INVOKANA) 300 MG TABS tablet Take 1 tablet (300 mg total) by mouth daily before breakfast. 90 tablet 3   colesevelam (WELCHOL) 625 MG tablet Take 2 tablets (1,250 mg total) by mouth daily. 180 tablet 3   doxazosin (CARDURA) 8 MG tablet Take 1 tablet (8 mg total) by mouth at bedtime. 90 tablet 3   fluticasone (FLONASE) 50 MCG/ACT nasal spray PLACE 1 SPRAY INTO BOTH NOSTRILS DAILY 32 g 10   glucose blood (ONE TOUCH ULTRA TEST) test strip USE TO TEST BLOOD USGAR TWICE A DAY 100 each 3   repaglinide (PRANDIN) 2 MG tablet TAKE 2  TABLETS BY MOUTH THREE TIMES DAILY BEFORE MEAL(S) 540 tablet 0   Semaglutide, 1 MG/DOSE, (OZEMPIC, 1 MG/DOSE,) 2 MG/1.5ML SOPN Inject 1 mg into the skin once a week. 4 mL 1   simvastatin (ZOCOR) 40 MG tablet Take 1 tablet (40 mg total) by mouth at bedtime. 90 tablet 3   traMADol (ULTRAM) 50 MG tablet TAKE 1/2 TAB BY  MOUTH TWICE A DAY 100 tablet 1   varenicline (CHANTIX CONTINUING MONTH PAK) 1 MG tablet Take 1 tablet (1 mg total) by mouth daily. 30 tablet 0   No current facility-administered medications on file prior to visit.     No Known Allergies  Family History  Problem Relation Age of Onset   Cancer Mother    Pancreatic cancer Mother    Heart disease Father    Heart attack Father    Diabetes Other    Hyperlipidemia Other    Hypertension Other    Rectal cancer Sister    Colon cancer Neg Hx    Esophageal cancer Neg Hx    Stomach cancer Neg Hx    Stroke Neg Hx    Colon polyps Neg Hx     Ht 5\' 10"  (1.778 m)    BMI 36.01 kg/m    Review of Systems He denies hypoglycemia.      Objective:   Physical Exam      Assessment & Plan:  Insulin-requiring type 2 DM, with renal insuff: He prob needs to resume insulin.   Patient Instructions  Please send me the A1c result. Please let us know what the difference is between the bedtime and morning blood sugar check your blood sugar once a day.  vary the time of day when you check, between before the 3 meals, and at bedtime.  also check if you have symptoms of your blood sugar being too high or too low.  please keep a record of the readings and bring it to your next appointment here (or you can bring the meter itself).  You can write it on any piece of paper.  please call us sooner if your blood sugar goes below 70, or if you have a lot of readings over 200.   Please come back for a follow-up appointment in 3 months.

## 2019-08-02 NOTE — Patient Instructions (Addendum)
Please send me the A1c result. Please let us know what the difference is between the bedtime and morning blood sugar check your blood sugar once a day.  vary the time of day when you check, between before the 3 meals, and at bedtime.  also check if you have symptoms of your blood sugar being too high or too low.  please keep a record of the readings and bring it to your next appointment here (or you can bring the meter itself).  You can write it on any piece of paper.  please call us sooner if your blood sugar goes below 70, or if you have a lot of readings over 200.   Please come back for a follow-up appointment in 3 months.

## 2020-01-31 ENCOUNTER — Other Ambulatory Visit: Payer: Self-pay | Admitting: *Deleted

## 2020-01-31 MED ORDER — SIMVASTATIN 40 MG PO TABS: 40.0000 mg | ORAL_TABLET | Freq: Every day | ORAL | 0 refills | Status: AC

## 2020-03-12 ENCOUNTER — Other Ambulatory Visit: Payer: Self-pay | Admitting: *Deleted

## 2020-03-12 DIAGNOSIS — I10 Essential (primary) hypertension: Secondary | ICD-10-CM

## 2020-03-12 MED ORDER — DOXAZOSIN MESYLATE 8 MG PO TABS: 8.0000 mg | ORAL_TABLET | Freq: Every day | ORAL | 0 refills | Status: DC

## 2020-04-29 ENCOUNTER — Encounter: Payer: Self-pay | Admitting: Gastroenterology

## 2020-05-15 ENCOUNTER — Telehealth: Payer: Self-pay | Admitting: *Deleted

## 2020-05-15 NOTE — Telephone Encounter (Signed)
Pharmacy requesting Rx simvastatin 40 mg Pt need OV for refills, Pharmacy said Rx refilled by other MD

## 2020-06-03 ENCOUNTER — Other Ambulatory Visit: Payer: Self-pay | Admitting: *Deleted

## 2020-06-03 DIAGNOSIS — I10 Essential (primary) hypertension: Secondary | ICD-10-CM

## 2020-06-03 IMAGING — NM NM GI BLOOD LOSS
2 series · 12 of 12 positions shown · non-contrast
Comparison: None.

CLINICAL DATA: 67-year-old GI bleed.  Evaluate for bleeding source.

EXAM:
NUCLEAR MEDICINE GASTROINTESTINAL BLEEDING SCAN
TECHNIQUE: Sequential abdominal images were obtained following intravenous
administration of Jc-VVm labeled red blood cells.
RADIOPHARMACEUTICALS:  Twenty-four mCi Jc-VVm pertechnetate in-vitro
labeled red cells.

[Series 1: raw data · 4.46mm/px · 6 of 60 frames shown (1 of 2)]
[frame 6/60]
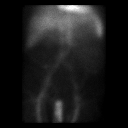
[frame 16/60]
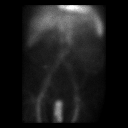
[frame 26/60]
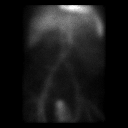
[frame 36/60]
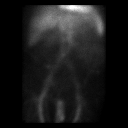
[frame 46/60]
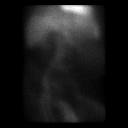
[frame 56/60]
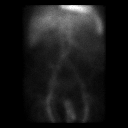

[Series 1: raw data · 4.46mm/px · 6 of 60 frames shown (2 of 2)]
[frame 6/60]
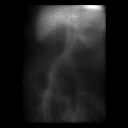
[frame 16/60]
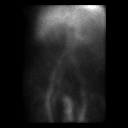
[frame 26/60]
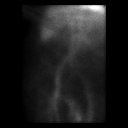
[frame 36/60]
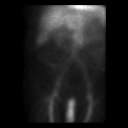
[frame 46/60]
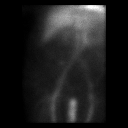
[frame 56/60]
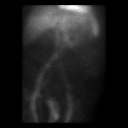

[12 of 12 positions shown; findings below may reference images not displayed]

FINDINGS: Study has technical limitations due to patient motion throughout the
examination. Small focus of uptake in the right lower quadrant does
not move and finding is indeterminate. Large amount of penile
uptake. Uptake in the expected vascular structures.
IMPRESSION: Technically challenging examination without a clear source for GI
bleeding. Small focus of uptake in the right lower quadrant is
indeterminate.

## 2020-06-03 MED ORDER — DOXAZOSIN MESYLATE 8 MG PO TABS: 8.0000 mg | ORAL_TABLET | Freq: Every day | ORAL | 0 refills | Status: DC

## 2020-09-22 ENCOUNTER — Other Ambulatory Visit: Payer: Self-pay | Admitting: *Deleted

## 2020-09-22 DIAGNOSIS — I10 Essential (primary) hypertension: Secondary | ICD-10-CM

## 2020-09-22 MED ORDER — DOXAZOSIN MESYLATE 8 MG PO TABS: 8.0000 mg | ORAL_TABLET | Freq: Every day | ORAL | 0 refills | Status: AC

## 2020-10-19 ENCOUNTER — Telehealth: Payer: Self-pay | Admitting: *Deleted

## 2020-10-19 NOTE — Telephone Encounter (Signed)
LVM to schedule appointment for refills  Last OV 12/13/2018

## 2022-08-22 ENCOUNTER — Encounter: Payer: Self-pay | Admitting: *Deleted

## 2022-11-10 ENCOUNTER — Encounter: Payer: Self-pay | Admitting: *Deleted

## 2023-01-27 DEATH — deceased
